# Patient Record
Sex: Male | Born: 1950
Health system: Southern US, Community
[De-identification: ages and names within clinical notes are randomized; demographics above are authoritative.]

## PROBLEM LIST (undated history)

## (undated) DIAGNOSIS — I10 Essential (primary) hypertension: Secondary | ICD-10-CM

## (undated) DIAGNOSIS — E785 Hyperlipidemia, unspecified: Secondary | ICD-10-CM

## (undated) DIAGNOSIS — E039 Hypothyroidism, unspecified: Secondary | ICD-10-CM

## (undated) DIAGNOSIS — L719 Rosacea, unspecified: Secondary | ICD-10-CM

## (undated) DIAGNOSIS — M1990 Unspecified osteoarthritis, unspecified site: Secondary | ICD-10-CM

## (undated) DIAGNOSIS — E079 Disorder of thyroid, unspecified: Secondary | ICD-10-CM

## (undated) DIAGNOSIS — K219 Gastro-esophageal reflux disease without esophagitis: Secondary | ICD-10-CM

## (undated) DIAGNOSIS — Z8719 Personal history of other diseases of the digestive system: Secondary | ICD-10-CM

## (undated) DIAGNOSIS — E876 Hypokalemia: Secondary | ICD-10-CM

## (undated) DIAGNOSIS — N200 Calculus of kidney: Secondary | ICD-10-CM

## (undated) HISTORY — DX: Essential (primary) hypertension: I10

## (undated) HISTORY — DX: Hypokalemia: E87.6

## (undated) HISTORY — DX: Calculus of kidney: N20.0

## (undated) HISTORY — DX: Hyperlipidemia, unspecified: E78.5

## (undated) HISTORY — PX: OTHER SURGICAL HISTORY: SHX169

## (undated) HISTORY — DX: Rosacea, unspecified: L71.9

## (undated) HISTORY — PX: SPINE SURGERY: SHX786

## (undated) HISTORY — PX: COLONOSCOPY: SHX174

## (undated) HISTORY — DX: Disorder of thyroid, unspecified: E07.9

---

## 1980-07-31 HISTORY — PX: HEMORRHOID SURGERY: SHX153

## 1980-07-31 HISTORY — PX: OTHER SURGICAL HISTORY: SHX169

## 1997-05-31 ENCOUNTER — Encounter: Payer: Self-pay | Admitting: Family Medicine

## 1997-05-31 LAB — CONVERTED CEMR LAB: PSA: 1.7 ng/mL

## 1999-12-01 ENCOUNTER — Encounter: Payer: Self-pay | Admitting: Neurosurgery

## 1999-12-02 ENCOUNTER — Inpatient Hospital Stay (HOSPITAL_COMMUNITY): Admission: RE | Admit: 1999-12-02 | Discharge: 1999-12-03 | Payer: Self-pay | Admitting: Neurosurgery

## 1999-12-02 ENCOUNTER — Encounter: Payer: Self-pay | Admitting: Neurosurgery

## 1999-12-02 HISTORY — PX: LAMINECTOMY: SHX219

## 1999-12-19 ENCOUNTER — Encounter: Payer: Self-pay | Admitting: Neurosurgery

## 1999-12-19 ENCOUNTER — Encounter: Admission: RE | Admit: 1999-12-19 | Discharge: 1999-12-19 | Payer: Self-pay | Admitting: Neurosurgery

## 2000-02-02 ENCOUNTER — Encounter: Payer: Self-pay | Admitting: Neurosurgery

## 2000-02-02 ENCOUNTER — Encounter: Admission: RE | Admit: 2000-02-02 | Discharge: 2000-02-02 | Payer: Self-pay | Admitting: Neurosurgery

## 2001-08-16 ENCOUNTER — Ambulatory Visit (HOSPITAL_COMMUNITY): Admission: RE | Admit: 2001-08-16 | Discharge: 2001-08-16 | Payer: Self-pay | Admitting: Orthopedic Surgery

## 2001-08-16 ENCOUNTER — Encounter: Payer: Self-pay | Admitting: Orthopedic Surgery

## 2002-02-28 ENCOUNTER — Encounter: Payer: Self-pay | Admitting: Family Medicine

## 2002-02-28 LAB — CONVERTED CEMR LAB: PSA: 1.4 ng/mL

## 2003-04-15 HISTORY — PX: OTHER SURGICAL HISTORY: SHX169

## 2004-02-29 ENCOUNTER — Encounter: Payer: Self-pay | Admitting: Family Medicine

## 2004-02-29 LAB — CONVERTED CEMR LAB: PSA: 1.43 ng/mL

## 2004-03-31 ENCOUNTER — Encounter: Payer: Self-pay | Admitting: Family Medicine

## 2004-03-31 LAB — CONVERTED CEMR LAB: PSA: 2.4 ng/mL

## 2004-10-19 ENCOUNTER — Ambulatory Visit: Payer: Self-pay | Admitting: Family Medicine

## 2005-03-31 ENCOUNTER — Encounter: Payer: Self-pay | Admitting: Family Medicine

## 2005-03-31 LAB — CONVERTED CEMR LAB: PSA: 1.87 ng/mL

## 2005-04-18 ENCOUNTER — Ambulatory Visit: Payer: Self-pay | Admitting: Family Medicine

## 2005-04-20 ENCOUNTER — Ambulatory Visit: Payer: Self-pay | Admitting: Family Medicine

## 2005-08-14 ENCOUNTER — Ambulatory Visit: Payer: Self-pay | Admitting: Family Medicine

## 2005-10-18 ENCOUNTER — Ambulatory Visit: Payer: Self-pay | Admitting: Family Medicine

## 2006-04-30 ENCOUNTER — Encounter: Payer: Self-pay | Admitting: Family Medicine

## 2006-04-30 LAB — CONVERTED CEMR LAB: PSA: 3.16 ng/mL

## 2006-05-03 ENCOUNTER — Ambulatory Visit: Payer: Self-pay | Admitting: Family Medicine

## 2006-05-17 ENCOUNTER — Ambulatory Visit: Payer: Self-pay | Admitting: Family Medicine

## 2006-06-30 ENCOUNTER — Encounter: Payer: Self-pay | Admitting: Family Medicine

## 2006-06-30 LAB — CONVERTED CEMR LAB: PSA: 2.53 ng/mL

## 2006-07-13 ENCOUNTER — Ambulatory Visit: Payer: Self-pay | Admitting: Family Medicine

## 2006-07-17 ENCOUNTER — Ambulatory Visit: Payer: Self-pay | Admitting: Family Medicine

## 2006-10-12 ENCOUNTER — Ambulatory Visit: Payer: Self-pay | Admitting: Family Medicine

## 2006-10-12 LAB — CONVERTED CEMR LAB
PSA: 3.46 ng/mL
PSA: 3.46 ng/mL (ref 0.10–4.00)
TSH: 4.16 microintl units/mL (ref 0.35–5.50)

## 2006-10-16 ENCOUNTER — Ambulatory Visit: Payer: Self-pay | Admitting: Family Medicine

## 2006-12-10 HISTORY — PX: PROSTATE BIOPSY: SHX241

## 2007-05-14 ENCOUNTER — Encounter: Payer: Self-pay | Admitting: Family Medicine

## 2007-05-14 DIAGNOSIS — E039 Hypothyroidism, unspecified: Secondary | ICD-10-CM | POA: Insufficient documentation

## 2007-05-14 DIAGNOSIS — I10 Essential (primary) hypertension: Secondary | ICD-10-CM | POA: Insufficient documentation

## 2007-05-15 DIAGNOSIS — R739 Hyperglycemia, unspecified: Secondary | ICD-10-CM | POA: Insufficient documentation

## 2007-05-22 ENCOUNTER — Ambulatory Visit: Payer: Self-pay | Admitting: Family Medicine

## 2007-05-22 LAB — CONVERTED CEMR LAB
ALT: 27 units/L (ref 0–53)
AST: 22 units/L (ref 0–37)
BUN: 11 mg/dL (ref 6–23)
CO2: 29 meq/L (ref 19–32)
Calcium: 9.1 mg/dL (ref 8.4–10.5)
Chloride: 108 meq/L (ref 96–112)
Cholesterol: 141 mg/dL (ref 0–200)
Creatinine, Ser: 1.1 mg/dL (ref 0.4–1.5)
Creatinine,U: 119.5 mg/dL
Free T4: 0.9 ng/dL (ref 0.6–1.6)
GFR calc Af Amer: 90 mL/min
GFR calc non Af Amer: 74 mL/min
Glucose, Bld: 110 mg/dL — ABNORMAL HIGH (ref 70–99)
HDL: 32.8 mg/dL — ABNORMAL LOW (ref >39.0)
LDL Cholesterol: 87 mg/dL (ref 0–99)
Microalb Creat Ratio: 13.4 mg/g (ref 0.0–30.0)
Microalb, Ur: 1.6 mg/dL (ref 0.0–1.9)
PSA: 2.8 ng/mL (ref 0.10–4.00)
Potassium: 3.5 meq/L (ref 3.5–5.1)
Sodium: 142 meq/L (ref 135–145)
TSH: 3.88 microintl units/mL (ref 0.35–5.50)
Total CHOL/HDL Ratio: 4.3
Triglycerides: 106 mg/dL (ref 0–149)
VLDL: 21 mg/dL (ref 0–40)

## 2007-05-30 ENCOUNTER — Encounter: Payer: Self-pay | Admitting: Family Medicine

## 2007-06-05 ENCOUNTER — Ambulatory Visit: Payer: Self-pay | Admitting: Family Medicine

## 2007-06-06 ENCOUNTER — Encounter: Payer: Self-pay | Admitting: Family Medicine

## 2007-06-28 ENCOUNTER — Ambulatory Visit: Payer: Self-pay | Admitting: Family Medicine

## 2007-07-01 ENCOUNTER — Encounter (INDEPENDENT_AMBULATORY_CARE_PROVIDER_SITE_OTHER): Payer: Self-pay | Admitting: *Deleted

## 2007-07-01 ENCOUNTER — Encounter: Payer: Self-pay | Admitting: Family Medicine

## 2007-10-22 ENCOUNTER — Encounter: Payer: Self-pay | Admitting: Family Medicine

## 2007-11-06 ENCOUNTER — Encounter: Payer: Self-pay | Admitting: Family Medicine

## 2007-11-22 ENCOUNTER — Ambulatory Visit: Payer: Self-pay | Admitting: Family Medicine

## 2007-11-22 LAB — CONVERTED CEMR LAB
BUN: 10 mg/dL (ref 6–23)
CO2: 31 meq/L (ref 19–32)
Calcium: 9.2 mg/dL (ref 8.4–10.5)
Chloride: 107 meq/L (ref 96–112)
Creatinine, Ser: 1 mg/dL (ref 0.4–1.5)
GFR calc Af Amer: 100 mL/min
GFR calc non Af Amer: 83 mL/min
Glucose, Bld: 116 mg/dL — ABNORMAL HIGH (ref 70–99)
Potassium: 3.4 meq/L — ABNORMAL LOW (ref 3.5–5.1)
Sodium: 144 meq/L (ref 135–145)

## 2007-11-28 ENCOUNTER — Ambulatory Visit: Payer: Self-pay | Admitting: Family Medicine

## 2008-04-01 ENCOUNTER — Encounter: Payer: Self-pay | Admitting: Family Medicine

## 2008-04-02 ENCOUNTER — Encounter: Payer: Self-pay | Admitting: Family Medicine

## 2008-06-02 ENCOUNTER — Ambulatory Visit: Payer: Self-pay | Admitting: Family Medicine

## 2008-06-02 LAB — CONVERTED CEMR LAB
ALT: 24 U/L (ref 0–53)
AST: 22 U/L (ref 0–37)
Albumin: 4 g/dL (ref 3.5–5.2)
Alkaline Phosphatase: 62 U/L (ref 39–117)
BUN: 11 mg/dL (ref 6–23)
Bilirubin, Direct: 0.1 mg/dL (ref 0.0–0.3)
CO2: 32 meq/L (ref 19–32)
Calcium: 9.1 mg/dL (ref 8.4–10.5)
Chloride: 103 meq/L (ref 96–112)
Cholesterol: 130 mg/dL (ref 0–200)
Creatinine, Ser: 1 mg/dL (ref 0.4–1.5)
Creatinine,U: 74.9 mg/dL
Free T4: 1 ng/dL (ref 0.6–1.6)
GFR calc Af Amer: 100 mL/min
GFR calc non Af Amer: 82 mL/min
Glucose, Bld: 94 mg/dL (ref 70–99)
HDL: 39.9 mg/dL (ref >39.0)
LDL Cholesterol: 71 mg/dL (ref 0–99)
Microalb Creat Ratio: 12 mg/g (ref 0.0–30.0)
Microalb, Ur: 0.9 mg/dL (ref 0.0–1.9)
PSA: 2.8 ng/mL (ref 0.10–4.00)
Potassium: 3.3 meq/L — ABNORMAL LOW (ref 3.5–5.1)
Sodium: 141 meq/L (ref 135–145)
TSH: 4.56 u[IU]/mL (ref 0.35–5.50)
Total Bilirubin: 1.4 mg/dL — ABNORMAL HIGH (ref 0.3–1.2)
Total CHOL/HDL Ratio: 3.3
Total Protein: 6.7 g/dL (ref 6.0–8.3)
Triglycerides: 95 mg/dL (ref 0–149)
VLDL: 19 mg/dL (ref 0–40)

## 2008-06-11 ENCOUNTER — Ambulatory Visit: Payer: Self-pay | Admitting: Family Medicine

## 2008-07-15 ENCOUNTER — Encounter (INDEPENDENT_AMBULATORY_CARE_PROVIDER_SITE_OTHER): Payer: Self-pay | Admitting: *Deleted

## 2008-07-15 ENCOUNTER — Ambulatory Visit: Payer: Self-pay | Admitting: Family Medicine

## 2008-07-15 LAB — CONVERTED CEMR LAB
OCCULT 1: NEGATIVE
OCCULT 2: NEGATIVE
OCCULT 3: NEGATIVE

## 2008-08-24 ENCOUNTER — Telehealth: Payer: Self-pay | Admitting: Family Medicine

## 2008-11-18 ENCOUNTER — Encounter: Payer: Self-pay | Admitting: Family Medicine

## 2008-12-01 ENCOUNTER — Encounter: Payer: Self-pay | Admitting: Family Medicine

## 2008-12-02 ENCOUNTER — Ambulatory Visit: Payer: Self-pay | Admitting: Family Medicine

## 2008-12-02 LAB — CONVERTED CEMR LAB
BUN: 13 mg/dL (ref 6–23)
CO2: 30 meq/L (ref 19–32)
Calcium: 8.9 mg/dL (ref 8.4–10.5)
Chloride: 109 meq/L (ref 96–112)
Creatinine, Ser: 1.1 mg/dL (ref 0.4–1.5)
GFR calc non Af Amer: 73.62 mL/min (ref >60)
Glucose, Bld: 100 mg/dL — ABNORMAL HIGH (ref 70–99)
Potassium: 3.7 meq/L (ref 3.5–5.1)
Sodium: 144 meq/L (ref 135–145)

## 2008-12-09 ENCOUNTER — Ambulatory Visit: Payer: Self-pay | Admitting: Family Medicine

## 2008-12-09 ENCOUNTER — Encounter (INDEPENDENT_AMBULATORY_CARE_PROVIDER_SITE_OTHER): Payer: Self-pay | Admitting: *Deleted

## 2009-03-31 ENCOUNTER — Encounter: Payer: Self-pay | Admitting: Family Medicine

## 2009-04-01 ENCOUNTER — Encounter: Payer: Self-pay | Admitting: Family Medicine

## 2009-06-11 ENCOUNTER — Ambulatory Visit: Payer: Self-pay | Admitting: Family Medicine

## 2009-06-13 LAB — CONVERTED CEMR LAB
ALT: 24 units/L (ref 0–53)
AST: 21 units/L (ref 0–37)
Albumin: 4.3 g/dL (ref 3.5–5.2)
Alkaline Phosphatase: 61 units/L (ref 39–117)
BUN: 11 mg/dL (ref 6–23)
Bilirubin, Direct: 0.2 mg/dL (ref 0.0–0.3)
CO2: 30 meq/L (ref 19–32)
Calcium: 9.1 mg/dL (ref 8.4–10.5)
Chloride: 103 meq/L (ref 96–112)
Cholesterol: 141 mg/dL (ref 0–200)
Creatinine, Ser: 1 mg/dL (ref 0.4–1.5)
Creatinine,U: 25.1 mg/dL
Free T4: 0.9 ng/dL (ref 0.6–1.6)
GFR calc non Af Amer: 82.02 mL/min (ref >60)
Glucose, Bld: 102 mg/dL — ABNORMAL HIGH (ref 70–99)
HDL: 38.4 mg/dL — ABNORMAL LOW (ref >39.00)
LDL Cholesterol: 85 mg/dL (ref 0–99)
Magnesium: 2.3 mg/dL (ref 1.5–2.5)
Microalb Creat Ratio: 8 mg/g (ref 0.0–30.0)
Microalb, Ur: 0.2 mg/dL (ref 0.0–1.9)
PSA: 4.1 ng/mL — ABNORMAL HIGH (ref 0.10–4.00)
Potassium: 3.8 meq/L (ref 3.5–5.1)
Sodium: 140 meq/L (ref 135–145)
TSH: 2.7 microintl units/mL (ref 0.35–5.50)
Total Bilirubin: 1.9 mg/dL — ABNORMAL HIGH (ref 0.3–1.2)
Total CHOL/HDL Ratio: 4
Total Protein: 7.1 g/dL (ref 6.0–8.3)
Triglycerides: 87 mg/dL (ref 0.0–149.0)
VLDL: 17.4 mg/dL (ref 0.0–40.0)

## 2009-06-17 ENCOUNTER — Ambulatory Visit: Payer: Self-pay | Admitting: Family Medicine

## 2009-06-17 DIAGNOSIS — S335XXA Sprain of ligaments of lumbar spine, initial encounter: Secondary | ICD-10-CM | POA: Insufficient documentation

## 2009-07-28 ENCOUNTER — Ambulatory Visit: Payer: Self-pay | Admitting: Family Medicine

## 2009-07-28 LAB — FECAL OCCULT BLOOD, GUAIAC: Fecal Occult Blood: NEGATIVE

## 2009-07-28 LAB — CONVERTED CEMR LAB
OCCULT 1: NEGATIVE
OCCULT 2: NEGATIVE
OCCULT 3: NEGATIVE

## 2009-07-29 ENCOUNTER — Encounter: Payer: Self-pay | Admitting: Family Medicine

## 2009-09-14 ENCOUNTER — Encounter (INDEPENDENT_AMBULATORY_CARE_PROVIDER_SITE_OTHER): Payer: Self-pay

## 2009-09-15 ENCOUNTER — Telehealth: Payer: Self-pay | Admitting: Family Medicine

## 2009-09-16 ENCOUNTER — Encounter: Payer: Self-pay | Admitting: Family Medicine

## 2009-09-17 ENCOUNTER — Ambulatory Visit: Payer: Self-pay | Admitting: Gastroenterology

## 2009-09-24 ENCOUNTER — Ambulatory Visit: Payer: Self-pay | Admitting: Gastroenterology

## 2009-09-24 LAB — HM COLONOSCOPY

## 2009-09-28 ENCOUNTER — Encounter: Payer: Self-pay | Admitting: Gastroenterology

## 2009-12-22 ENCOUNTER — Encounter: Payer: Self-pay | Admitting: Family Medicine

## 2010-01-03 ENCOUNTER — Emergency Department (HOSPITAL_COMMUNITY): Admission: EM | Admit: 2010-01-03 | Discharge: 2010-01-03 | Payer: Self-pay | Admitting: Family Medicine

## 2010-01-05 ENCOUNTER — Ambulatory Visit: Payer: Self-pay | Admitting: Family Medicine

## 2010-01-05 DIAGNOSIS — J069 Acute upper respiratory infection, unspecified: Secondary | ICD-10-CM | POA: Insufficient documentation

## 2010-01-05 DIAGNOSIS — H6980 Other specified disorders of Eustachian tube, unspecified ear: Secondary | ICD-10-CM | POA: Insufficient documentation

## 2010-01-25 ENCOUNTER — Encounter: Payer: Self-pay | Admitting: Family Medicine

## 2010-03-08 ENCOUNTER — Encounter (INDEPENDENT_AMBULATORY_CARE_PROVIDER_SITE_OTHER): Payer: Self-pay | Admitting: *Deleted

## 2010-03-31 ENCOUNTER — Encounter: Payer: Self-pay | Admitting: Family Medicine

## 2010-06-14 ENCOUNTER — Telehealth (INDEPENDENT_AMBULATORY_CARE_PROVIDER_SITE_OTHER): Payer: Self-pay | Admitting: *Deleted

## 2010-06-16 ENCOUNTER — Ambulatory Visit: Payer: Self-pay | Admitting: Family Medicine

## 2010-06-16 LAB — CONVERTED CEMR LAB
ALT: 32 units/L (ref 0–53)
AST: 28 units/L (ref 0–37)
Albumin: 4.5 g/dL (ref 3.5–5.2)
Alkaline Phosphatase: 53 units/L (ref 39–117)
BUN: 14 mg/dL (ref 6–23)
Bilirubin, Direct: 0.2 mg/dL (ref 0.0–0.3)
CO2: 29 meq/L (ref 19–32)
Calcium: 9.2 mg/dL (ref 8.4–10.5)
Chloride: 102 meq/L (ref 96–112)
Cholesterol: 157 mg/dL (ref 0–200)
Creatinine, Ser: 1 mg/dL (ref 0.4–1.5)
GFR calc non Af Amer: 79.88 mL/min (ref >60)
Glucose, Bld: 98 mg/dL (ref 70–99)
HDL: 43.8 mg/dL (ref >39.00)
LDL Cholesterol: 99 mg/dL (ref 0–99)
PSA: 3.23 ng/mL (ref 0.10–4.00)
Potassium: 3.9 meq/L (ref 3.5–5.1)
Sodium: 140 meq/L (ref 135–145)
TSH: 4.49 microintl units/mL (ref 0.35–5.50)
Total Bilirubin: 1.9 mg/dL — ABNORMAL HIGH (ref 0.3–1.2)
Total CHOL/HDL Ratio: 4
Total Protein: 7.1 g/dL (ref 6.0–8.3)
Triglycerides: 73 mg/dL (ref 0.0–149.0)
VLDL: 14.6 mg/dL (ref 0.0–40.0)

## 2010-06-21 ENCOUNTER — Ambulatory Visit: Payer: Self-pay | Admitting: Family Medicine

## 2010-08-30 NOTE — Letter (Signed)
Summary: Results Letter  [facility]  [address] [place], [place] [address]   Phone: [phone]  Fax: [phone]        September 28, 2009 MRN: [id number]    [facility] [address] [place], [place]  [address]    Dear Mr. SHI,  Your biopsy results did not show any remarkable findings.  I recommend you repeat your colonoscopy in 10 years.  Should you have any further questions or immediate concers, feel free to contact me.  Sincerely,  Barbette Hair. Arlyce Dice, M.D., [facility]          Sincerely,  Louis Meckel MD  This letter has been electronically signed by your physician.  Appended Document: Results Letter letter mailed 3.3.11

## 2010-08-30 NOTE — Assessment & Plan Note (Signed)
Summary: cpx/rbh   Vital Signs:  Patient profile:   60 year old male Weight:      244 pounds Temp:     98.5 degrees F oral Pulse rate:   80 / minute Pulse rhythm:   regular BP sitting:   128 / 82  (left arm) Cuff size:   large  Vitals Entered By: Sydell Axon LPN (June 17, 2009 2:56 PM) CC: 30 Minute checkup, hemoccult cards given to patient   History of Present Illness: Pt here for Comp Exam, has blood every time he has a BM. He has known hemm with fissure. He is having low back pain...aleve hasn't helped. He has had disc problem with radiculopathy of th left, now with pain down the left side. Basically emanates from the sciatic notch. He has seen Dr Jeral Fruit a long time ago for  pain of the neck with laminectomy in 2001. Has gained 5 pounds since last year due to vacation!!  Preventive Screening-Counseling & Management  Alcohol-Tobacco     Alcohol drinks/day: 0     Alcohol type: rare drinking     Smoking Status: never     Passive Smoke Exposure: no  Caffeine-Diet-Exercise     Caffeine use/day: 0     Does Patient Exercise: yes     Type of exercise: weights, elliptical, bike     Times/week: 3  Problems Prior to Update: 1)  Special Screening Malig Neoplasms Other Sites  (ICD-V76.49) 2)  Health Maintenance Exam  (ICD-V70.0) 3)  Disorder, Carbohydrate Metabolism Nos  (ICD-271.9) 4)  Screening For Malignant Neoplasm, Prostate  (ICD-V76.44) 5)  Hyperglycemia  (ICD-790.29) 6)  Hypokalemia, Secondary Hctz, Avoid  (ICD-276.8) 7)  Hypercholesterolemia (238/LDL 158)  (ICD-272.0) 8)  Hypothyroidism  (ICD-244.9) 9)  Hypertension  (ICD-401.9)  Medications Prior to Update: 1)  Klor-Con 10 10 Meq  Tbcr (Potassium Chloride) .... Take 6 Tablets Daily 2)  Accupril 40 Mg  Tabs (Quinapril Hcl) .... Take One By Mouth Once A Day 3)  Synthroid 112 Mcg  Tabs (Levothyroxine Sodium) .... Take One By Mouth Once A Day 4)  Spironolactone 25 Mg  Tabs (Spironolactone) .... Take One By Mouth Once  A Day 5)  Caduet 5-10 Mg  Tabs (Amlodipine-Atorvastatin) .... Take One By Mouth At Bedtime 6)  Anamantle Hc Forte 3-1 %  Kit (Lidocaine-Hydrocortisone Ace) .... Use As Directed  Allergies: No Known Drug Allergies  Past History:  Past Medical History: Last updated: 05/14/2007 Hypertension Hypothyroidism  Past Surgical History: Last updated: 06/05/2007 Morton's Neuroma Repair 1982 Hemmorhoidectomy 1982 Colonoscopy wnl except int hemms  12/24/98            10 years Laminectomy C4/5 5/6 6/7 Fusion (Dr. 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  Risk Factors: Alcohol Use: 0 (06/17/2009) Caffeine Use: 0 (06/17/2009) Exercise: yes (06/17/2009)  Risk Factors: Smoking Status: never (06/17/2009) Passive Smoke Exposure: no (  06/17/2009)  Family History: Father: dec (9/08)  Ruptured AAA  polys Mother: dec  acute kidney failure,CAD, HTN,DM,Diab Retinop, Toe Amputation Sister A 60 CV + CAD mother HBP + Mother DM + Mother, GM Prostate cancer + M Uncle x 2 Depression - none ETOH/Drug abuse - none Strokes - none  Social History: Caffeine use/day:  0  Review of Systems General:  Denies chills, fatigue, fever, loss of appetite, malaise, sleep disorder, sweats, weakness, and weight loss. Eyes:  Denies blurring, discharge, eye pain, and light sensitivity. ENT:  Denies  decreased hearing, ear discharge, earache, and ringing in ears. CV:  Denies chest pain or discomfort, near fainting, palpitations, and shortness of breath with exertion. Resp:  Denies cough, shortness of breath, sputum productive, and wheezing. GI:  Complains of bloody stools and hemorrhoids; denies abdominal pain, change in bowel habits, constipation, dark tarry stools, diarrhea, excessive appetite, gas, indigestion, loss of appetite, nausea, vomiting, vomiting blood, and yellowish skin color; irritation. GU:  Denies discharge, dysuria, hematuria, incontinence, nocturia, and urinary frequency. MS:  Complains of low back pain; denies joint pain, muscle aches, muscle weakness, and stiffness. Derm:  Denies dryness, itching, and rash. Neuro:  Denies difficulty with concentration, headaches, memory loss, poor balance, tingling, and tremors.  Physical Exam  General:  Well-developed,well-nourished,in no acute distress; alert,appropriate and cooperative throughout examination Head:  Normocephalic and atraumatic without obvious abnormalities. No apparent alopecia but typical male pattern balding. Eyes:  Conjunctiva clear bilaterally.  Ears:  External ear exam shows no significant lesions or deformities.  Otoscopic examination reveals clear canals, tympanic membranes are intact bilaterally without bulging, retraction, inflammation or discharge. Hearing is grossly normal bilaterally. Nose:  External nasal examination shows no deformity or inflammation. Nasal mucosa are pink and moist without lesions or exudates. Mouth:  Oral mucosa and oropharynx without lesions or exudates.  Teeth in good repair. Neck:  No deformities, masses, or tenderness noted. Chest Wall:  No deformities, masses, tenderness or gynecomastia noted. Breasts:  No masses or gynecomastia noted Lungs:  Normal respiratory effort, chest expands symmetrically. Lungs are clear to auscultation, no crackles or wheezes. Heart:  Normal rate and  regular rhythm. S1 and S2 normal without gallop, murmur, click, rub or other extra sounds. Abdomen:  Bowel sounds positive,abdomen soft and non-tender without masses, organomegaly or hernias noted. Rectal:  No external abnormalities noted. Normal sphincter tone. No rectal masses or tenderness. G pos, hemms inflamed and sore. No overt blood when done. Genitalia:  Testes bilaterally descended without nodularity, tenderness or masses. No scrotal masses or lesions. No penis lesions or urethral discharge. Prostate:  Prostate gland firm and smooth, no enlargement, nodularity, tenderness, mass  or induration. 40gms with right pole larger than left but smooth and NT. Msk:  No deformity or scoliosis noted of thoracic or lumbar spine.  Back sore to lying down and getting up in the lower lumbar area, NT top palpation. Sciatic notch NT but radiculopathy down sciatic distribution. Pulses:  R and L carotid,radial,femoral,dorsalis pedis and posterior tibial pulses are full and equal bilaterally Extremities:  No clubbing, cyanosis, edema, or deformity noted with normal full range of motion of all joints.   Neurologic:  No cranial nerve deficits noted. Station and gait are normal. Plantar reflexes are down-going bilaterally. DTRs are symmetrical throughout. Sensory, motor and coordinative functions appear intact. Skin:  Intact without suspicious lesions or rashes Cervical Nodes:  No lymphadenopathy noted Inguinal Nodes:  No significant adenopathy Psych:  Cognition and judgment appear intact. Alert and cooperative  with normal attention span and concentration. No apparent delusions, illusions, hallucinations   Impression & Recommendations:  Problem # 1:  HEALTH MAINTENANCE EXAM (ICD-V70.0) Assessment Comment Only  Problem # 2:  DISORDER, CARBOHYDRATE METABOLISM NOS (ICD-271.9) Assessment: Unchanged Stable. Again stressed care with sweets and carbs.  Problem # 3:  SCREENING FOR MALIGNANT NEOPLASM, PROSTATE  (ICD-V76.44) Bears watching. Insure repeats next year. Has Assymetry of right pole > left but smooth and regular.  Problem # 4:  HYPERGLYCEMIA (ICD-790.29) Assessment: Unchanged Stable.  Problem # 5:  HYPOKALEMIA, SECONDARY HCTZ, AVOID (ICD-276.8) Assessment: Improved Stable.  Problem # 6:  HYPERCHOLESTEROLEMIA (238/LDL 158) (ICD-272.0) Assessment: Unchanged Stable. His updated medication list for this problem includes:    Caduet 5-10 Mg Tabs (Amlodipine-atorvastatin) .Marland Kitchen... Take one by mouth at bedtime  Labs Reviewed: SGOT: 21 (06/11/2009)   SGPT: 24 (06/11/2009)   HDL:38.40 (06/11/2009), 39.9 (06/02/2008)  LDL:85 (06/11/2009), 71 (06/02/2008)  Chol:141 (06/11/2009), 130 (06/02/2008)  Trig:87.0 (06/11/2009), 95 (06/02/2008)  Problem # 7:  HYPOTHYROIDISM (ICD-244.9) Assessment: Unchanged Stable. Cont curr med. His updated medication list for this problem includes:    Synthroid 112 Mcg Tabs (Levothyroxine sodium) .Marland Kitchen... Take one by mouth once a day  Labs Reviewed: TSH: 2.70 (06/11/2009)    Chol: 141 (06/11/2009)   HDL: 38.40 (06/11/2009)   LDL: 85 (06/11/2009)   TG: 87.0 (06/11/2009)  Problem # 8:  HYPERTENSION (ICD-401.9) Assessment: Unchanged Stable. His updated medication list for this problem includes:    Accupril 40 Mg Tabs (Quinapril hcl) .Marland Kitchen... Take one by mouth once a day    Spironolactone 25 Mg Tabs (Spironolactone) .Marland Kitchen... Take one by mouth once a day    Caduet 5-10 Mg Tabs (Amlodipine-atorvastatin) .Marland Kitchen... Take one by mouth at bedtime  BP today: 128/82 Prior BP: 120/80 (12/09/2008)  Labs Reviewed: K+: 3.8 (06/11/2009) Creat: : 1.0 (06/11/2009)   Chol: 141 (06/11/2009)   HDL: 38.40 (06/11/2009)   LDL: 85 (06/11/2009)   TG: 87.0 (06/11/2009)  Complete Medication List: 1)  Klor-con 10 10 Meq Tbcr (Potassium chloride) .... Take 6 tablets daily 2)  Accupril 40 Mg Tabs (Quinapril hcl) .... Take one by mouth once a day 3)  Synthroid 112 Mcg Tabs (Levothyroxine sodium)  .... Take one by mouth once a day 4)  Spironolactone 25 Mg Tabs (Spironolactone) .... Take one by mouth once a day 5)  Caduet 5-10 Mg Tabs (Amlodipine-atorvastatin) .... Take one by mouth at bedtime 6)  Anamantle Hc Forte 3-1 % Kit (Lidocaine-hydrocortisone ace) .... Use as directed 7)  Prednisone 20 Mg Tabs (Prednisone) .... 3 tabs by mouth in am for 5 days then  decrease by 1/2 tab by mouth ever 2 days . when finished 5 mg daily for 4 days. 8)  Prednisone 5 Mg Tabs (Prednisone) .... One tab by mouth once daily after 20 mg taper 9)  Flexeril 10 Mg Tabs (Cyclobenzaprine hcl) .... One tab by mouth three times a day  Patient Instructions: 1)  RTC as needed. 2)  Insure PSA again next year. Prescriptions: FLEXERIL 10 MG TABS (CYCLOBENZAPRINE HCL) one tab by mouth three times a day  #30 x 0   Entered and Authorized by:   Shaune Leeks MD   Signed by:   Shaune Leeks MD on 06/17/2009   Method used:   Electronically to        [facility]  [address] [id number]       [address] [facility]       [place]  [place], [place]  [address]       Ph: [phone]       Fax: [phone]   RxID:   [id number] PREDNISONE 5 MG TABS (PREDNISONE) one tab by mouth once daily after 20 mg taper  #4 x 0   Entered and Authorized by:   Shaune Leeks MD   Signed by:   Shaune Leeks MD on 06/17/2009   Method used:   Electronically to        [facility]  [address] [phone][facility] (retail)       [address]       [place], [place]  [address]       Ph: [phone]       Fax: [phone]   RxID:   [id number] PREDNISONE 20 MG TABS (PREDNISONE) 3 tabs by mouth in AM for 5 days then  decrease by 1/2 tab by mouth ever 2 days . when finished 5 mg daily for 4 days.  #30 x 0   Entered and Authorized by:   Shaune Leeks MD   Signed by:   Shaune Leeks MD on 06/17/2009   Method used:   [facility]        [facility]  [address] [id number]       [address]       [place], [place]  [address]       Ph: [phone]       Fax: [phone]   RxID:   [id number]   Current Allergies (reviewed today): No known allergies    Influenza Immunization History:    Influenza # 1:  Fluvax 3+ (04/23/2009)    Appended Document: cpx/rbh    Clinical Lists Changes  Problems: Added new problem of LUMBAR STRAIN, ACUTE (ICD-847.2) Assessed LUMBAR STRAIN, ACUTE as new - Start Steriod taper and musc relaxer. RTC if sxs don't abate.        Impression & Recommendations:  Problem # 1:  LUMBAR STRAIN, ACUTE (ICD-847.2) Assessment New Start Steriod taper and musc relaxer. RTC if sxs don't abate.  Complete Medication List: 1)  Klor-con 10 10 Meq Tbcr (Potassium chloride) .... Take 6 tablets daily 2)  Accupril 40 Mg Tabs (Quinapril hcl) .... Take one by mouth once a day 3)  Synthroid 112 Mcg Tabs (Levothyroxine sodium) .... Take one by mouth once a day 4)  Spironolactone 25 Mg Tabs (Spironolactone) .... Take one by mouth once a day 5)  Caduet 5-10 Mg Tabs (Amlodipine-atorvastatin) .... Take one by mouth at bedtime 6)  Anamantle Hc Forte 3-1 % Kit (Lidocaine-hydrocortisone ace) .... Use as directed 7)  Prednisone 20 Mg Tabs (Prednisone) .... 3 tabs by mouth in am for 5 days then  decrease by 1/2 tab by mouth ever 2 days . when finished 5 mg daily for 4 days. 8)  Prednisone 5 Mg Tabs (Prednisone) .... One tab by mouth once daily after 20 mg taper 9)  Flexeril 10 Mg Tabs (Cyclobenzaprine hcl) .... One tab by mouth three times a day

## 2010-08-30 NOTE — Letter (Signed)
Summary: Results Follow up Letter  [place] [facility]  [address] [place], [place] [address]   Phone: [phone]  Fax: [phone]    07/29/2009 MRN: [id number]  [facility] [address] [place], [place]  [address]  Dear Mr. PREVETTE,  The following are the results of your recent test(s):  Test         Result    Pap Smear:        Normal _____  Not Normal _____ Comments: ______________________________________________________ Cholesterol: LDL(Bad cholesterol):         Your goal is less than:         HDL (Good cholesterol):       Your goal is more than: Comments:  ______________________________________________________ Mammogram:        Normal _____  Not Normal _____ Comments:  ___________________________________________________________________ Hemoccult:        Normal __X___  Not normal _______ Comments:  Please follow up in one year.  _____________________________________________________________________ Other Tests:    We routinely do not discuss normal results over the telephone.  If you desire a copy of the results, or you have any questions about this information we can discuss them at your next office visit.   Sincerely,       Laurita Quint, MD

## 2010-08-30 NOTE — Assessment & Plan Note (Signed)
Summary: ?SINUS,EAR INFECTION/CLE   Vital Signs:  Patient profile:   60 year old male Weight:      250.25 pounds BMI:     33.14 Temp:     98.2 degrees F oral Pulse rate:   92 / minute Pulse rhythm:   regular BP sitting:   140 / 88  (left arm) Cuff size:   large  Vitals Entered By: Sydell Axon LPN (January 05, 453 12:37 PM) CC: Went to [facility] ER Monday, has a lot of head congestion and can't hear out of left ear   History of Present Illness: Pt here for sinus congestion and inability to hear from the left ear. He was seen in the [facility] and given Avelox and IBP.  He has had chills for 24 hrs, no fever. His sxs started May 29th. He started with throat congestion and used an expectorant...[facility] brand guaifenesin and a cough suppressant....started Mucinex DM and ran out.  He had chills, no fever, he has mild left discomfort, right ok biut can't here out of the left ear, nasal congestion with infrequent yellow mucous, no ST, some cough that is minimally productive.Marland KitchenMarland KitchenHe denies SOB, no N/V. BP is up slightly.  Problems Prior to Update: 1)  Lumbar Strain, Acute  (ICD-847.2) 2)  Special Screening Malig Neoplasms Other Sites  (ICD-V76.49) 3)  Health Maintenance Exam  (ICD-V70.0) 4)  Screening For Malignant Neoplasm, Prostate  (ICD-V76.44) 5)  Hyperglycemia  (ICD-790.29) 6)  Hypokalemia, Secondary Hctz, Avoid  (ICD-276.8) 7)  Hypercholesterolemia (238/LDL 158)  (ICD-272.0) 8)  Hypothyroidism  (ICD-244.9) 9)  Hypertension  (ICD-401.9)  Medications Prior to Update: 1)  Klor-Con 10 10 Meq  Tbcr (Potassium Chloride) .... Take 6 Tablets Daily 2)  Accupril 40 Mg  Tabs (Quinapril Hcl) .... Take One By Mouth Once A Day 3)  Synthroid 112 Mcg  Tabs (Levothyroxine Sodium) .... Take One By Mouth Once A Day 4)  Spironolactone 25 Mg  Tabs (Spironolactone) .... Take One By Mouth Once A Day 5)  Caduet 5-10 Mg  Tabs (Amlodipine-Atorvastatin) .... Take One By Mouth At Bedtime 6)  Anamantle Hc Forte 3-1 %   Kit (Lidocaine-Hydrocortisone Ace) .... Use As Directed 7)  Prednisone 20 Mg Tabs (Prednisone) .... 3 Tabs By Mouth in Am For 5 Days Then  Decrease By 1/2 Tab By Mouth Ever 2 Days . When Finished 5 Mg Daily For 4 Days. 8)  Prednisone 5 Mg Tabs (Prednisone) .... One Tab By Mouth Once Daily After 20 Mg Taper 9)  Flexeril 10 Mg Tabs (Cyclobenzaprine Hcl) .... One Tab By Mouth Three Times A Day 10)  Anusol-Hc 25 Mg Supp (Hydrocortisone Acetate) .... One Supp Per Rectum Three Times A Day As Needed Hemms.  Allergies: No Known Drug Allergies  Physical Exam  General:  Well-developed,well-nourished,in no acute distress; alert,appropriate and cooperative throughout examination, mildly congested and hoarse. Head:  Normocephalic and atraumatic without obvious abnormalities. No apparent alopecia but typical male pattern balding. SInuses minimally tender in max distrib Eyes:  Conjunctiva clear bilaterally.  Ears:  External ear exam shows no significant lesions or deformities.  Otoscopic examination reveals clear canals, tympanic membranes are intact bilaterally without bulging, retraction, inflammation or discharge. Left TM dull and slightly distorted but nonrythematous. Hearing is grossly normal bilaterally. Nose:  External nasal examination shows no deformity or inflammation. Nasal mucosa are pink and moist without lesions or exudates. Left nostril slightly narrowed with minimal inflammation. Mouth:  Oral mucosa and oropharynx without lesions or exudates.  Teeth in good repair. Pharynx minimally inflamed. Neck:  No deformities, masses, or tenderness noted. Lungs:  Normal respiratory effort, chest expands symmetrically. Lungs are clear to auscultation, no crackles or wheezes. Heart:  Normal rate and regular rhythm. S1 and S2 normal without gallop, murmur, click, rub or other extra sounds.   Impression & Recommendations:  Problem # 1:  URI (ICD-465.9) Assessment New  With minimal response to Avelox,  will presume his inflammation viral, not bacterial.  See instructions. His updated medication list for this problem includes:    Ibuprofen 800 Mg Tabs (Ibuprofen) .Marland Kitchen... Take one three times a day with meals as needed  Instructed on symptomatic treatment. Call if symptoms persist or worsen.   His updated medication list for this problem includes:    Ibuprofen 800 Mg Tabs (Ibuprofen) .Marland Kitchen... Take one three times a day with meals as needed  Problem # 2:  DYSFUNCTION OF EUSTACHIAN TUBE (ICD-381.81) Assessment: New See instructions.  Complete Medication List: 1)  Klor-con 10 10 Meq Tbcr (Potassium chloride) .... Take 6 tablets daily 2)  Accupril 40 Mg Tabs (Quinapril hcl) .... Take one by mouth once a day 3)  Synthroid 112 Mcg Tabs (Levothyroxine sodium) .... Take one by mouth once a day 4)  Spironolactone 25 Mg Tabs (Spironolactone) .... Take one by mouth once a day 5)  Caduet 5-10 Mg Tabs (Amlodipine-atorvastatin) .... Take one by mouth at bedtime 6)  Anamantle Hc Forte 3-1 % Kit (Lidocaine-hydrocortisone ace) .... Use as directed 7)  Flexeril 10 Mg Tabs (Cyclobenzaprine hcl) .... One tab by mouth three times a day 8)  Anusol-hc 25 Mg Supp (Hydrocortisone acetate) .... One supp per rectum three times a day as needed hemms. 9)  Ibuprofen 800 Mg Tabs (Ibuprofen) .... Take one three times a day with meals as needed 10)  Acetaminophen-codeine 120-12 Mg/47ml Soln (Acetaminophen-codeine) .... 5-81ml every four hours as needed cough or pain 11)  Avelox 400 Mg Tabs (Moxifloxacin hcl) .... Take one by mouth x 10 days 12)  Amoxicillin 500 Mg Caps (Amoxicillin) .... 2 tabs by mouth two times a day  Patient Instructions: 1)  Cont Guaifenesin....take 11/2 AM and Noon. 2)  Finish Avelox. 3)  Use Afrin 3 squirts one minute apart three times a day for threee days. 4)  Cont IBP 8oomg three times a day after meals. 5)  Lots of fluids. 6)  If develop cough, keep lozenge in moiuth. 7)  If becomes problem  taking Avelox and Accupril, stop Avelox. If sxs worsen doing that, start Amox (script given)  Prescriptions: AMOXICILLIN 500 MG CAPS (AMOXICILLIN) 2 tabs by mouth two times a day  #56 x 0   Entered and Authorized by:   Shaune Leeks MD   Signed by:   Shaune Leeks MD on 01/05/2010   Method used:   Print then Give to Patient   RxID:   [id number]   Current Allergies (reviewed today): No known allergies

## 2010-08-30 NOTE — Medication Information (Signed)
Summary: RX Folder/[facility] Signed and Faxed  RX Folder/[facility] Signed and Faxed   Imported By: Mickle Asper 06/07/2007 09:49:39  _____________________________________________________________________  External Attachment:    Type:   Image     Comment:   External Document

## 2010-08-30 NOTE — Letter (Signed)
Summary: Recall Colonoscopy Letter  [facility]  [address] [place], [place] [address]   Phone: [phone]  Fax: [phone]      Dec 09, 2008 MRN: [id number]   [facility] [address] [place], [place]  [address]   Dear Carlos Foster,   According to your medical record, it is time for you to schedule a Colonoscopy. The American Cancer Society recommends this procedure as a method to detect early colon cancer. Patients with a family history of colon cancer, or a personal history of colon polyps or inflammatory bowel disease are at increased risk.  This letter has beeen generated based on the recommendations made at the time of your procedure. If you feel that in your particular situation this may no longer apply, please contact our office.  Please call our office at [phone] to schedule this appointment or to update your records at your earliest convenience.  Thank you for cooperating with [place] to provide you with the very best care possible.   Sincerely,  Barbette Hair. Arlyce Dice, M.D.  [facility] [phone]

## 2010-08-30 NOTE — Assessment & Plan Note (Signed)
Summary: cpx/rbh   Vital Signs:  Patient Profile:   60 Years Old Male Height:     73.75 inches Weight:      256 pounds Temp:     99 degrees F oral Pulse rate:   88 / minute Pulse rhythm:   regular BP sitting:   140 / 80  (left arm) Cuff size:   large  Vitals Entered By: Providence Crosby (June 05, 2007 10:57 AM)                 Chief Complaint:  check up hemocult cards to patient.  History of Present Illness: Since seen, had prostate biopsy in May which was benign. No complaints and feels well. 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  Current Allergies: No known allergies   Past Surgical History:    Morton's Neuroma Repair 1982    Hemmorhoidectomy 1982    Colonoscopy wnl except int hemms  12/24/98            10 years    Laminectomy C4/5 5/6 6/7 Fusion (Dr. Jeral Fruit) 12/02/99    ETT, wnl ST dereased < 1mm V6 04/15/03    Prostate bx Annabell Howells)  Benign  12/10/06   Family History:    Father: dec (9/08)  Ruptured AAA  polys    Mother: dec  acute kidney failure,CAD, HTN,DM,Diab Retinop, Toe Amputation    Sister alive and well    CV + CAD mother    HBP + Mother    DM + Mother, GM    Prostate cancer + M Uncle x 2    Depression - none    ETOH/Drug abuse - none    Strokes - none   Risk Factors:  Tobacco use:  never Passive smoke exposure:  no Drug use:  no HIV high-risk behavior:  no Caffeine use:  3 drinks per day Alcohol use:  yes    Type:  rare drinking    Drinks per day:  0    Has patient --       Felt need to cut down:  no       Been annoyed by complaints:  no       Felt guilty about drinking:  no       Needed eye opener in the morning:  no    Counseled to quit/cut down alcohol use:  no Exercise:  yes    Times per week:  3    Type:  weights, elliptical, bike Seatbelt use:  100 %   Review of Systems  General      Denies chills, fatigue, fever, loss of appetite, malaise, sleep disorder, sweats, weakness, and weight loss.  Eyes  Denies blurring, discharge, double vision, eye irritation, eye pain, halos, itching, light sensitivity, red eye, vision loss-1 eye, and vision loss-both eyes.  ENT      Complains of decreased hearing.      Denies difficulty swallowing, ear discharge, earache, hoarseness, nasal congestion, nosebleeds, postnasal drainage, ringing in ears, sinus pressure, and sore throat.      slight left ear  CV      Denies bluish discoloration of lips or nails, chest pain or discomfort, difficulty breathing at night, difficulty breathing while lying down, fainting, fatigue, leg cramps with exertion, lightheadness, near fainting, palpitations, shortness of breath with exertion, swelling of feet, swelling of hands, and weight gain.  Resp      Denies chest discomfort, chest pain with inspiration, cough, coughing up  blood, excessive snoring, hypersomnolence, morning headaches, pleuritic, shortness of breath, sputum productive, and wheezing.  GI      Denies abdominal pain, bloody stools, change in bowel habits, constipation, dark tarry stools, diarrhea, excessive appetite, gas, hemorrhoids, indigestion, loss of appetite, nausea, vomiting, vomiting blood, and yellowish skin color.  GU      Complains of nocturia.      once  MS      Denies joint pain, joint redness, joint swelling, loss of strength, low back pain, mid back pain, muscle aches, muscle , cramps, muscle weakness, stiffness, and thoracic pain.  Derm      Denies changes in color of skin, changes in nail beds, dryness, excessive perspiration, flushing, hair loss, insect bite(s), itching, lesion(s), poor wound healing, and rash.  Neuro      Denies brief paralysis, difficulty with concentration, disturbances in coordination, falling down, headaches, inability to speak, memory loss, numbness, poor balance, seizures, sensation of room spinning, tingling, tremors, visual disturbances, and weakness.   Physical Exam  General:      Well-developed,well-nourished,in no acute distress; alert,appropriate and cooperative throughout examination Head:     Normocephalic and atraumatic without obvious abnormalities. No apparent alopecia or balding. Eyes:     Conjunctiva clear bilaterally.  Ears:     External ear exam shows no significant lesions or deformities.  Otoscopic examination reveals clear canals, tympanic membranes are intact bilaterally without bulging, retraction, inflammation or discharge. Hearing is grossly normal bilaterally. Nose:     External nasal examination shows no deformity or inflammation. Nasal mucosa are pink and moist without lesions or exudates. Mouth:     Oral mucosa and oropharynx without lesions or exudates.  Teeth in good repair. Neck:     No deformities, masses, or tenderness noted. Chest Wall:     No deformities, masses, tenderness or gynecomastia noted. Breasts:     No masses or gynecomastia noted Lungs:     Normal respiratory effort, chest expands symmetrically. Lungs are clear to auscultation, no crackles or wheezes. Heart:     Normal rate and regular rhythm. S1 and S2 normal without gallop, murmur, click, rub or other extra sounds. Abdomen:     Bowel sounds positive,abdomen soft and non-tender without masses, organomegaly or hernias noted. Rectal:     Not done...seeing Dr Annabell Howells regularly at present. Genitalia:     Not done Prostate:     Not done Msk:     No deformity or scoliosis noted of thoracic or lumbar spine.   Pulses:     R and L carotid,radial,femoral,dorsalis pedis and posterior tibial pulses are full and equal bilaterally Extremities:     No clubbing, cyanosis, edema, or deformity noted with normal full range of motion of all joints.   Neurologic:     No cranial nerve deficits noted. Station and gait are normal. Plantar reflexes are down-going bilaterally. DTRs are symmetrical throughout. Sensory, motor and coordinative functions appear intact. Skin:     Intact without  suspicious lesions or rashes. Benign moles throughout. Cervical Nodes:     No lymphadenopathy noted Inguinal Nodes:     No significant adenopathy Psych:     Cognition and judgment appear intact. Alert and cooperative with normal attention span and concentration. No apparent delusions, illusions, hallucinations    Impression & Recommendations:  Problem # 1:  HEALTH MAINTENANCE EXAM (ICD-V70.0) Assessment: Comment Only  Problem # 2:  DISORDER, CARBOHYDRATE METABOLISM NOS (ICD-271.9) Assessment: Unchanged Stable, encouraged to cont to watch intake.  Problem #  3:  SCREENING FOR MALIGNANT NEOPLASM, PROSTATE (ICD-V76.44) Assessment: Unchanged Stable ...had benign biopsy, to see Dr Annabell Howells early Dec again.  Problem # 4:  HYPOKALEMIA, SECONDARY HCTZ, AVOID (ICD-276.8) Assessment: Improved Stable.  Problem # 5:  HYPERCHOLESTEROLEMIA (238/LDL 158) (ICD-272.0) Assessment: Unchanged Stable and acceptable, continue as is. His updated medication list for this problem includes:    Caduet 5-10 Mg Tabs (Amlodipine-atorvastatin) .Marland Kitchen... Take one by mouth at bedtime  Labs Reviewed: Chol: 141 (05/22/2007)   HDL: 32.8 (05/22/2007)   LDL: 87 (05/22/2007)   TG: 106 (05/22/2007) SGOT: 22 (05/22/2007)   SGPT: 27 (05/22/2007)   Problem # 6:  HYPOTHYROIDISM (ICD-244.9) Assessment: Unchanged Stable...cont curr dose. His updated medication list for this problem includes:    Synthroid 112 Mcg Tabs (Levothyroxine sodium) .Marland Kitchen... Take one by mouth once a day  Labs Reviewed: TSH: 3.88 (05/22/2007)    Chol: 141 (05/22/2007)   HDL: 32.8 (05/22/2007)   LDL: 87 (05/22/2007)   TG: 106 (05/22/2007)   Problem # 7:  HYPERTENSION (ICD-401.9) Assessment: Unchanged Home nos marvelous...will follow. His updated medication list for this problem includes:    Accupril 40 Mg Tabs (Quinapril hcl) .Marland Kitchen... Take one by mouth once a day    Spironolactone 25 Mg Tabs (Spironolactone) .Marland Kitchen... Take one by mouth once a day     Caduet 5-10 Mg Tabs (Amlodipine-atorvastatin) .Marland Kitchen... Take one by mouth at bedtime  BP today: 140/80  Labs Reviewed: Creat: 1.1 (05/22/2007) Chol: 141 (05/22/2007)   HDL: 32.8 (05/22/2007)   LDL: 87 (05/22/2007)   TG: 106 (05/22/2007)   Complete Medication List: 1)  Klor-con 10 10 Meq Tbcr (Potassium chloride) .... Take 6 tablets daily 2)  Accupril 40 Mg Tabs (Quinapril hcl) .... Take one by mouth once a day 3)  Synthroid 112 Mcg Tabs (Levothyroxine sodium) .... Take one by mouth once a day 4)  Spironolactone 25 Mg Tabs (Spironolactone) .... Take one by mouth once a day 5)  Caduet 5-10 Mg Tabs (Amlodipine-atorvastatin) .... Take one by mouth at bedtime 6)  Anamantle Hc Forte 3-1 % Kit (Lidocaine-hydrocortisone ace) .... Use as directed   Patient Instructions: 1)  RTC 6 mos, BMET prior 2)  Eventually screen for AAA due to father's disease in the future.    Prescriptions: ANAMANTLE HC FORTE 3-1 %  KIT (LIDOCAINE-HYDROCORTISONE ACE) use as directed  #1box x 3   Entered by:   Providence Crosby   Authorized by:   Shaune Leeks MD   Signed by:   Providence Crosby on 06/05/2007   Method used:   Print then Give to Patient   RxID:   [id number] CADUET 5-10 MG  TABS (AMLODIPINE-ATORVASTATIN) Take one by mouth at bedtime  #90 x 4   Entered by:   Providence Crosby   Authorized by:   Shaune Leeks MD   Signed by:   Providence Crosby on 06/05/2007   Method used:   Print then Give to Patient   RxID:   [id number] SPIRONOLACTONE 25 MG  TABS (SPIRONOLACTONE) Take one by mouth once a day  #90 x 4   Entered by:   Providence Crosby   Authorized by:   Shaune Leeks MD   Signed by:   Providence Crosby on 06/05/2007   Method used:   Print then Give to Patient   RxID:   [id number] SYNTHROID 112 MCG  TABS (LEVOTHYROXINE SODIUM) Take one by mouth once a day  #90 x 4   Entered by:   Providence Crosby  Authorized by:   Shaune Leeks MD   Signed by:   Providence Crosby on 06/05/2007   Method used:    Print then Give to Patient   RxID:   [id number] ACCUPRIL 40 MG  TABS (QUINAPRIL HCL) Take one by mouth once a day  #90 x 4   Entered by:   Providence Crosby   Authorized by:   Shaune Leeks MD   Signed by:   Providence Crosby on 06/05/2007   Method used:   Print then Give to Patient   RxID:   [id number] KLOR-CON 10 10 MEQ  TBCR (POTASSIUM CHLORIDE) Take 6 tablets daily  #540 x 4   Entered by:   Providence Crosby   Authorized by:   Shaune Leeks MD   Signed by:   Providence Crosby on 06/05/2007   Method used:   Print then Give to Patient   RxID:   [id number]  ]  Influenza Immunization History:    Influenza # 1:  Fluvax 3+ (05/30/2007)

## 2010-08-30 NOTE — Assessment & Plan Note (Signed)
Summary: 6 M F/U DLO   Vital Signs:  Patient profile:   60 year old male Height:      73 inches Weight:      239 pounds BMI:     31.65 Temp:     97.9 degrees F oral Pulse rate:   76 / minute Pulse rhythm:   regular BP sitting:   120 / 80  (left arm) Cuff size:   large  Vitals Entered By: Providence Crosby (Dec 09, 2008 2:41 PM) CC: 6 month followup   History of Present Illness: Pt here for 6 month recheck. Still has sm amt blood with wiping of BM. Otherwise is doing well. Is running 2 miles a day and lifting more weights than previously. Feels great.   Allergies (verified): No Known Drug Allergies  Physical Exam  General:  Well-developed,well-nourished,in no acute distress; alert,appropriate and cooperative throughout examination Head:  Normocephalic and atraumatic without obvious abnormalities. No apparent alopecia or balding. Eyes:  Conjunctiva clear bilaterally.  Ears:  External ear exam shows no significant lesions or deformities.  Otoscopic examination reveals clear canals, tympanic membranes are intact bilaterally without bulging, retraction, inflammation or discharge. Hearing is grossly normal bilaterally. Nose:  External nasal examination shows no deformity or inflammation. Nasal mucosa are pink and moist without lesions or exudates. Mouth:  Oral mucosa and oropharynx without lesions or exudates.  Teeth in good repair. Neck:  No deformities, masses, or tenderness noted. Chest Wall:  No deformities, masses, tenderness or gynecomastia noted. Lungs:  Normal respiratory effort, chest expands symmetrically. Lungs are clear to auscultation, no crackles or wheezes. Heart:  Normal rate and regular rhythm. S1 and S2 normal without gallop, murmur, click, rub or other extra sounds. Abdomen:  Bowel sounds positive,abdomen soft and non-tender without masses, organomegaly or hernias noted. Msk:  No deformity or scoliosis noted of thoracic or lumbar spine.   Extremities:  No clubbing,  cyanosis, edema, or deformity noted with normal full range of motion of all joints.     Impression & Recommendations:  Problem # 1:  DISORDER, CARBOHYDRATE METABOLISM NOS (ICD-271.9) Assessment Unchanged Stable. Has lost weight and kept it off.  Problem # 2:  HYPOKALEMIA, SECONDARY HCTZ, AVOID (ICD-276.8) Assessment: Unchanged Stable now for quite some time.  Problem # 3:  HYPOTHYROIDISM (ICD-244.9) Assessment: Unchanged Stable. His updated medication list for this problem includes:    Synthroid 112 Mcg Tabs (Levothyroxine sodium) .Marland Kitchen... Take one by mouth once a day  Labs Reviewed: TSH: 4.56 (06/02/2008)    Chol: 130 (06/02/2008)   HDL: 39.9 (06/02/2008)   LDL: 71 (06/02/2008)   TG: 95 (06/02/2008)  Problem # 4:  HYPERTENSION (ICD-401.9) Assessment: Unchanged Good control. Cont curr meds. His updated medication list for this problem includes:    Accupril 40 Mg Tabs (Quinapril hcl) .Marland Kitchen... Take one by mouth once a day    Spironolactone 25 Mg Tabs (Spironolactone) .Marland Kitchen... Take one by mouth once a day    Caduet 5-10 Mg Tabs (Amlodipine-atorvastatin) .Marland Kitchen... Take one by mouth at bedtime  BP today: 120/80 Prior BP: 130/80 (06/11/2008)  Labs Reviewed: K+: 3.7 (12/02/2008) Creat: : 1.1 (12/02/2008)   Chol: 130 (06/02/2008)   HDL: 39.9 (06/02/2008)   LDL: 71 (06/02/2008)   TG: 95 (06/02/2008)  Complete Medication List: 1)  Klor-con 10 10 Meq Tbcr (Potassium chloride) .... Take 6 tablets daily 2)  Accupril 40 Mg Tabs (Quinapril hcl) .... Take one by mouth once a day 3)  Synthroid 112 Mcg Tabs (Levothyroxine sodium) .Marland KitchenMarland KitchenMarland Kitchen  Take one by mouth once a day 4)  Spironolactone 25 Mg Tabs (Spironolactone) .... Take one by mouth once a day 5)  Caduet 5-10 Mg Tabs (Amlodipine-atorvastatin) .... Take one by mouth at bedtime 6)  Anamantle Hc Forte 3-1 % Kit (Lidocaine-hydrocortisone ace) .... Use as directed  Patient Instructions: 1)  RTC 6 mos Comp Exam, labs prior

## 2010-08-30 NOTE — Progress Notes (Signed)
----   Converted from flag ---- ---- 06/14/2010 1:55 PM, Crawford Givens MD wrote: tsh 244.9 cmet/lipid 272.0 psa v76.44   ---- 06/14/2010 8:08 AM, Liane Comber CMA (AAMA) wrote: Lab orders please! Good Morning! This pt is scheduled for cpx labs [place], which labs to draw and dx codes to use? Thanks Tasha ------------------------------

## 2010-08-30 NOTE — Progress Notes (Signed)
Summary: refill request from [facility]  Phone Note Refill Request Message from:  Fax from Pharmacy  Refills Requested: Medication #1:  CADUET 5-10 MG  TABS Take one by mouth at bedtime Faxed form from [facility] is on your shelf.  Initial call taken by: Lowella Petties,  August 24, 2008 4:04 PM  Follow-up for Phone Call        Forms faxed Follow-up by: Lowella Petties,  August 24, 2008 5:05 PM      Prescriptions: CADUET 5-10 MG  TABS (AMLODIPINE-ATORVASTATIN) Take one by mouth at bedtime  #90 x 4   Entered and Authorized by:   Shaune Leeks MD   Signed by:   Shaune Leeks MD on 08/24/2008   Method used:   Print then Give to Patient   RxID:   [id number]

## 2010-08-30 NOTE — Assessment & Plan Note (Signed)
Summary: 6 m f/u  dlo   Vital Signs:  Patient Profile:   60 Years Old Male Height:     73.75 inches Weight:      265 pounds Temp:     97.9 degrees F tympanic Pulse rate:   88 / minute Pulse rhythm:   regular BP sitting:   130 / 80  (left arm) Cuff size:   large  Vitals Entered By: Providence Crosby (November 28, 2007 2:39 PM)                 Chief Complaint:  6 MONTH FOLLOWUP.  History of Present Illness: Here for followup, doing well. Sugar is now routinely elevated 110s. Mother was diabetic. Feels well and is as active as ever.    Prior Medications Reviewed Using: Patient Recall  Current Allergies: No known allergies       Physical Exam  General:     Well-developed,well-nourished,in no acute distress; alert,appropriate and cooperative throughout examination Head:     Normocephalic and atraumatic without obvious abnormalities. No apparent alopecia or balding. Eyes:     Conjunctiva clear bilaterally.  Ears:     External ear exam shows no significant lesions or deformities.  Otoscopic examination reveals clear canals, tympanic membranes are intact bilaterally without bulging, retraction, inflammation or discharge. Hearing is grossly normal bilaterally. Nose:     External nasal examination shows no deformity or inflammation. Nasal mucosa are pink and moist without lesions or exudates. Mouth:     Oral mucosa and oropharynx without lesions or exudates.  Teeth in good repair. Neck:     No deformities, masses, or tenderness noted. Chest Wall:     No deformities, masses, tenderness or gynecomastia noted. Lungs:     Normal respiratory effort, chest expands symmetrically. Lungs are clear to auscultation, no crackles or wheezes. Heart:     Normal rate and regular rhythm. S1 and S2 normal without gallop, murmur, click, rub or other extra sounds. Abdomen:     Bowel sounds positive,abdomen soft and non-tender without masses, organomegaly or hernias noted.    Impression &  Recommendations:  Problem # 1:  HYPERGLYCEMIA (ICD-790.29) Assessment: Deteriorated Worse but still not diabetic...discussed at length.Marland KitchenMarland KitchenRead Protein Power and Diabetes Solution. Avoid sweets and carbs.  Problem # 2:  HYPOKALEMIA, SECONDARY HCTZ, AVOID (ICD-276.8) Assessment: Improved Continues nml.  Problem # 3:  HYPERCHOLESTEROLEMIA (238/LDL 158) (ICD-272.0) Assessment: Unchanged Discussed. His updated medication list for this problem includes:    Caduet 5-10 Mg Tabs (Amlodipine-atorvastatin) .Marland Kitchen... Take one by mouth at bedtime  Labs Reviewed: Chol: 141 (05/22/2007)   HDL: 32.8 (05/22/2007)   LDL: 87 (05/22/2007)   TG: 106 (05/22/2007) SGOT: 22 (05/22/2007)   SGPT: 27 (05/22/2007)   Problem # 4:  HYPOTHYROIDISM (ICD-244.9) Assessment: Unchanged Stable. His updated medication list for this problem includes:    Synthroid 112 Mcg Tabs (Levothyroxine sodium) .Marland Kitchen... Take one by mouth once a day   Problem # 5:  HYPERTENSION (ICD-401.9) Assessment: Improved  His updated medication list for this problem includes:    Accupril 40 Mg Tabs (Quinapril hcl) .Marland Kitchen... Take one by mouth once a day    Spironolactone 25 Mg Tabs (Spironolactone) .Marland Kitchen... Take one by mouth once a day    Caduet 5-10 Mg Tabs (Amlodipine-atorvastatin) .Marland Kitchen... Take one by mouth at bedtime  BP today: 130/80 Prior BP: 140/80 (06/05/2007)  Labs Reviewed: Creat: 1.0 (11/22/2007) Chol: 141 (05/22/2007)   HDL: 32.8 (05/22/2007)   LDL: 87 (05/22/2007)   TG: 106 (05/22/2007)  Complete Medication List: 1)  Klor-con 10 10 Meq Tbcr (Potassium chloride) .... Take 6 tablets daily 2)  Accupril 40 Mg Tabs (Quinapril hcl) .... Take one by mouth once a day 3)  Synthroid 112 Mcg Tabs (Levothyroxine sodium) .... Take one by mouth once a day 4)  Spironolactone 25 Mg Tabs (Spironolactone) .... Take one by mouth once a day 5)  Caduet 5-10 Mg Tabs (Amlodipine-atorvastatin) .... Take one by mouth at bedtime 6)  Anamantle Hc Forte 3-1 %  Kit (Lidocaine-hydrocortisone ace) .... Use as directed   Patient Instructions: 1)  RTC 6 mos, COMP EXAM, labs prior    ]

## 2010-08-30 NOTE — Letter (Signed)
Summary: Results Follow up Letter  [place] at [facility]  [address] [place], [place] [address]   Phone: [phone]  Fax: [phone]    07/15/2008 MRN: [id number]  [facility] [place] [address] [place], [place]  [address]  Dear Mr. BRINER,  The following are the results of your recent test(s):  Test         Result    Pap Smear:        Normal _____  Not Normal _____ Comments: ______________________________________________________ Cholesterol: LDL(Bad cholesterol):         Your goal is less than:         HDL (Good cholesterol):       Your goal is more than: Comments:  ______________________________________________________ Mammogram:        Normal _____  Not Normal _____ Comments:  ___________________________________________________________________ Hemoccult:        Normal __X___  Not normal _______ Comments:    NO BLOOD IN STOOL. THANK YOU FOR RETURNING THE HEMOCCULT CARDS. PLEASE MAKE SURE TO REPEAT IN 0NE YEAR.  _____________________________________________________________________ Other Tests:    We routinely do not discuss normal results over the telephone.  If you desire a copy of the results, or you have any questions about this information we can discuss them at your next office visit.   Sincerely,

## 2010-08-30 NOTE — Letter (Signed)
Summary: Results Follow up Letter  [place] at [facility]  [address] [place], [place] [address]   Phone: [phone]  Fax: [phone]    07/01/2007 MRN: [id number]  [facility] [place] [address] [place], [place]  [address]  Dear Mr. LEPAGE,  The following are the results of your recent test(s):  Test         Result    Pap Smear:        Normal _____  Not Normal _____ Comments: ______________________________________________________ Cholesterol: LDL(Bad cholesterol):         Your goal is less than:         HDL (Good cholesterol):       Your goal is more than: Comments:  ______________________________________________________ Mammogram:        Normal _____  Not Normal _____ Comments:  ___________________________________________________________________ Hemoccult:        Normal _X____  Not normal _______ Comments: NO BLOOD IN STOOL. THANK YOU FOR RETURNING THE HEMOCULT CARDS. PLEASE MAKE SURE TO REPEAT IN ONE YEAR.   _____________________________________________________________________ Other Tests:    We routinely do not discuss normal results over the telephone.  If you desire a copy of the results, or you have any questions about this information we can discuss them at your next office visit.   Sincerely,

## 2010-08-30 NOTE — Miscellaneous (Signed)
Summary: Dr Kathrine Haddock  Dr Kathrine Haddock   Imported By: Beau Fanny 11/29/2007 11:05:34  _____________________________________________________________________  External Attachment:    Type:   Image     Comment:   External Document

## 2010-08-30 NOTE — Letter (Signed)
Summary: BP RECORDS AND HEIGHT WEIGHT CHART  BP RECORDS AND HEIGHT WEIGHT CHART   Imported By: Carin Primrose 06/12/2008 14:32:20  _____________________________________________________________________  External Attachment:    Type:   Image     Comment:   External Document

## 2010-08-30 NOTE — Letter (Signed)
Summary: Company Physical Exam  Company Physical Exam   Imported By: Beau Fanny 06/05/2007 13:52:31  _____________________________________________________________________  External Attachment:    Type:   Image     Comment:   External Document

## 2010-08-30 NOTE — Medication Information (Signed)
Summary: [facility] -New rx for Anusol-HC suppository 25mg .  [facility] -New rx for Anusol-HC suppository 25mg .   Imported By: Beau Fanny 09/27/2009 09:36:51  _____________________________________________________________________  External Attachment:    Type:   Image     Comment:   External Document

## 2010-08-30 NOTE — Assessment & Plan Note (Signed)
Summary: Carlos Foster patient   Vital Signs:  Patient profile:   60 year old male Height:      73 inches Weight:      255 pounds BMI:     33.76 Temp:     98.5 degrees F oral Pulse rate:   72 / minute Pulse rhythm:   regular BP sitting:   146 / 102  (left arm) Cuff size:   large  Vitals Entered By: Delilah Shan CMA Payne Garske Dull) (June 21, 2010 11:31 AM) CC: CPX / RNS pt., Preventive Care   History of Present Illness: CPE- See prev med.   Hypertension:      Using medication without problems or lightheadedness: yes Chest pain with exertion:no Edema:no Short of breath:no Average home BPs: usually controlled.   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    Elevated Cholesterol: Using medications without problems:yes Muscle aches: no Other complaints:no  hypothyroidism- compliant with meds.    Some heartburn after drinking soda.  Better after cutting out soda.    Allergies: No Known Drug Allergies  Past History:  Past Medical History: Last updated: 05/14/2007 Hypertension Hypothyroidism  Past Surgical History: Last updated: 09/30/2009 Morton's Neuroma Repair 1982 Hemmorhoidectomy 1982 Colonoscopy wnl except int hemms  12/24/98            10 years Laminectomy C4/5 5/6 6/7 Fusion (Dr. Jeral Fruit) 12/02/99 ETT, wnl ST dereased < 1mm V6 04/15/03 Prostate bx (Wrenn)  Benign  12/10/06 Colonoscopy Nodular Mucosa at [facility]  B9  Int Hemms (Dr Arlyce Dice)  09/24/2009           10 years  Family History: Reviewed history from 06/17/2009 and no changes required. Father: dec (9/08)  Ruptured AAA Mother: dec  acute kidney failure,CAD, HTN,DM,Diab Retinop, Toe Amputation Sister A 60 CV + CAD mother HBP + Mother DM + Mother, GM Lymphoma + M Uncle x 2 Depression - none ETOH/Drug abuse - none Strokes - none  Social History: Reviewed history from 05/14/2007 and no changes required. 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  Is a Agricultural consultant for [employer]. works out at [employer], 4-5 times a week never smoked alcohol: very rare  Review of Systems       See HPI.  Otherwise negative.    Physical Exam  General:  GEN: nad, alert and oriented HEENT: mucous membranes moist NECK: supple w/o LA CV: rrr.  no murmur PULM: ctab, no inc wob ABD: soft, +bs EXT: no edema SKIN: no acute rash  Rectal:  No external abnormalities noted except for external hemorrhoids. Normal sphincter tone. No rectal masses or tenderness. Prostate:  Prostate gland firm and smooth, mild enlargement, but nonodularity, tenderness, mass, asymmetry or induration.   Impression & Recommendations:  Problem # 1:  HEALTH MAINTENANCE EXAM (ICD-V70.0) See prev med below. continue exercise.   Problem # 2:  HYPERCHOLESTEROLEMIA (238/LDL 158) (ICD-272.0) labs reviewed.  no change in meds.  His updated medication list for this problem includes:    Caduet 5-10 Mg Tabs (Amlodipine-atorvastatin) .Marland Kitchen... Take one by mouth at bedtime  Problem # 3:  HYPOTHYROIDISM (ICD-244.9) labs reviewed.  no change in meds.  His updated medication list for this problem includes:    Synthroid 112 Mcg Tabs (Levothyroxine sodium) .Marland Kitchen... Take one by mouth once a day  Problem #  4:  HYPERTENSION (ICD-401.9) labs reviewed.  no change in meds.  His updated medication list for this problem includes:    Accupril 40 Mg Tabs (Quinapril hcl) .Marland Kitchen... Take one by mouth once a day    Spironolactone 25 Mg Tabs (Spironolactone) .Marland Kitchen... Take one by mouth once a day    Caduet 5-10 Mg Tabs (Amlodipine-atorvastatin) .Marland Kitchen... Take one by mouth at bedtime  Problem # 5:  SCREENING FOR MALIGNANT NEOPLASM, PROSTATE (ICD-V76.44) No nodularity and prev bx neg.  D/w patient ZO:XWRU and cons of PSA.  No other indication for intervention at this time.    Complete Medication List: 1)  Klor-con 10 10 Meq Tbcr (Potassium chloride) .... Take 6 tablets daily 2)  Accupril 40 Mg Tabs (Quinapril hcl) .... Take one by mouth once a day 3)  Synthroid 112 Mcg Tabs (Levothyroxine sodium) .... Take one by mouth once a day 4)  Spironolactone 25 Mg Tabs (Spironolactone) .... Take one by mouth once a day 5)  Caduet 5-10 Mg Tabs (Amlodipine-atorvastatin) .... Take one by mouth at bedtime 6)  Anamantle Hc Forte 3-1 % Kit (Lidocaine-hydrocortisone ace) .... Use as directed  Other Orders: Pneumococcal Vaccine (04540) Admin 1st Vaccine (98119)  Colorectal Screening:  Current Recommendations:    Hemoccult: NEG X 1 today  PSA Screening:    PSA: 3.23  (06/16/2010)  Immunization & Chemoprophylaxis:    Tetanus vaccine: Td  (04/01/2003)    Influenza vaccine: Historical  (03/31/2010)    Pneumovax: Pneumovax  (06/21/2010)  Patient Instructions: 1)  I would get a physical next year.  If you've had your labs done at work, let us know and we may not need to draw other labs before the visit.  Keep exercising and take care.  Let me know if you have other concerns (or if you BP stays elevated). Prescriptions: CADUET 5-10 MG  TABS (AMLODIPINE-ATORVASTATIN) Take one by mouth at bedtime  #90 x 4   Entered and Authorized by:   Crawford Givens MD   Signed by:   Crawford Givens MD on 06/21/2010   Method used:   Faxed to ...       [facility] (mail-order)             , [place]         Ph: [phone]       Fax: [phone]   RxID:   [id number] SPIRONOLACTONE 25 MG  TABS (SPIRONOLACTONE) Take one by mouth once a day  #90 x 4   Entered and Authorized by:   Crawford Givens MD   Signed by:   Crawford Givens MD on 06/21/2010   Method used:   Faxed to ...       [facility] (mail-order)             , [place]         Ph: [phone]       Fax: [phone]   RxID:   [id number] SYNTHROID 112 MCG  TABS (LEVOTHYROXINE SODIUM) Take one by mouth once a day  #90 x 4   Entered and  Authorized by:   Crawford Givens MD   Signed by:   Crawford Givens MD on 06/21/2010   Method used:   Faxed to ...       [facility] (mail-order)             , [place]         Ph: [phone]       Fax: [phone]   RxID:  [id number] ACCUPRIL 40 MG  TABS (QUINAPRIL HCL) Take one by mouth once a day  #90 x 4   Entered and Authorized by:   Crawford Givens MD   Signed by:   Crawford Givens MD on 06/21/2010   Method used:   Faxed to ...       [facility] (mail-order)             , [place]         Ph: [phone]       Fax: [phone]   RxID:   [id number] KLOR-CON 10 10 MEQ  TBCR (POTASSIUM CHLORIDE) Take 6 tablets daily  #540 x 4   Entered and Authorized by:   Crawford Givens MD   Signed by:   Crawford Givens MD on 06/21/2010   Method used:   Faxed to ...       [facility] (mail-order)             , [place]         Ph: [phone]       Fax: [phone]   RxID:   [id number]    Orders Added: 1)  Est. Patient Level IV [06237] 2)  New Patient 40-64 years [99386] 3)  Pneumococcal Vaccine [90732] 4)  Admin 1st Vaccine [90471]   Immunization History:  Influenza Immunization History:    Influenza:  historical (03/31/2010)  Immunizations Administered:  Pneumonia Vaccine:    Vaccine Type: Pneumovax    Site: left deltoid    Mfr: Merck    Dose: 0.5 ml    Route: IM    Given by: Delilah Shan CMA (AAMA)    Exp. Date: 11/25/2011    Lot #: [id number]    VIS given: 07/05/09 version given June 21, 2010.   Immunization History:  Influenza Immunization History:    Influenza:  Historical (03/31/2010)  Immunizations Administered:  Pneumonia Vaccine:    Vaccine Type: Pneumovax    Site: left deltoid    Mfr: Merck    Dose: 0.5 ml    Route: IM    Given by: Delilah Shan CMA (AAMA)    Exp. Date: 11/25/2011    Lot #: [id number]    VIS given: 07/05/09 version given June 21, 2010.  Current Allergies (reviewed today): No known allergies        Prevention & Chronic Care Immunizations    Influenza vaccine: Historical  (03/31/2010)    Tetanus booster: 04/01/2003: Td    Pneumococcal vaccine: Pneumovax  (06/21/2010)  Colorectal Screening   Hemoccult: negative  (07/28/2009)   Hemoccult action/deferral: NEG X 1 today  (06/21/2010)    Colonoscopy: DONE  (09/24/2009)   Colonoscopy due: 09/2019  Other Screening   PSA: 3.23  (06/16/2010)   Smoking status: never  (06/17/2009)  Lipids   Total Cholesterol: 157  (06/16/2010)   LDL: 99  (06/16/2010)   LDL Direct: Not documented   HDL: 43.80  (06/16/2010)   Triglycerides: 73.0  (06/16/2010)    SGOT (AST): 28  (06/16/2010)   SGPT (ALT): 32  (06/16/2010)   Alkaline phosphatase: 53  (06/16/2010)   Total bilirubin: 1.9  (06/16/2010)    Lipid flowsheet reviewed?: Yes   Progress toward LDL goal: At goal  Hypertension   Last Blood Pressure: 146 / 102  (06/21/2010)   Serum creatinine: 1.0  (06/16/2010)   Serum potassium 3.9  (06/16/2010)    Hypertension flowsheet reviewed?: Yes   Hypertension comments: see patient instructions  Self-Management Support :    Hypertension self-management support:  Not documented    Lipid self-management support: Not documented

## 2010-08-30 NOTE — Miscellaneous (Signed)
Summary: Lec previsit  Clinical Lists Changes  Medications: Added new medication of MOVIPREP 100 GM  SOLR (PEG-KCL-NACL-NASULF-NA ASC-C) As per prep instructions. - Signed Rx of MOVIPREP 100 GM  SOLR (PEG-KCL-NACL-NASULF-NA ASC-C) As per prep instructions.;  #1 x 0;  Signed;  Entered by: Ulis Rias RN;  Authorized by: Louis Meckel MD;  Method used: Electronically to [id number] [address] [place], [place], [place]  [address], Ph: [phone], Fax: [phone] Observations: Added new observation of NKA: T (09/17/2009 10:33)    Prescriptions: MOVIPREP 100 GM  SOLR (PEG-KCL-NACL-NASULF-NA ASC-C) As per prep instructions.  #1 x 0   Entered by:   Ulis Rias RN   Authorized by:   Louis Meckel MD   Signed by:   Ulis Rias RN on 09/17/2009   Method used:   [facility] [address] [id number]       [address]       [place], [place]  [address]       Ph: [phone]       Fax: [phone]   RxID:   [id number]

## 2010-08-30 NOTE — Progress Notes (Signed)
Summary: [facility]/Dr.Wrenn  [facility]/Dr.Wrenn   Imported By: Eleonore Chiquito 07/09/2007 07:53:37  _____________________________________________________________________  External Attachment:    Type:   Image     Comment:   External Document

## 2010-08-30 NOTE — Progress Notes (Signed)
Summary: anamantle hc is unavailable  Phone Note Refill Request Message from:  Fax from Pharmacy  Refills Requested: Medication #1:  ANAMANTLE HC FORTE 3-1 %  KIT use as directed This is unavailable from manufacturer, form from [facility] is on your shelf.  Initial call taken by: Lowella Petties CMA,  September 15, 2009 9:03 AM Caller: [facility]  Follow-up for Phone Call        Faxed form. Follow-up by: Lowella Petties CMA,  September 15, 2009 11:25 AM    New/Updated Medications: ANUSOL-HC 25 MG SUPP (HYDROCORTISONE ACETATE) one supp per rectum three times a day as needed hemms. Prescriptions: ANUSOL-HC 25 MG SUPP (HYDROCORTISONE ACETATE) one supp per rectum three times a day as needed hemms.  #30 x 6   Entered and Authorized by:   Shaune Leeks MD   Signed by:   Shaune Leeks MD on 09/15/2009   Method used:   Printed then faxed to ...         RxID:   [id number]

## 2010-08-30 NOTE — Assessment & Plan Note (Signed)
Summary: cpx /rbh   Vital Signs:  Patient Profile:   60 Years Old Male Height:     73. inches Weight:      239 pounds Temp:     97.5 degrees F oral Pulse rate:   72 / minute Pulse rhythm:   regular BP sitting:   130 / 80  (left arm) Cuff size:   large  Vitals Entered By: Providence Crosby (June 11, 2008 3:03 PM)                 Chief Complaint:  CHECK UP// HEMOCCULT CARDS TO PATIENT.  History of Present Illness: Pt here for Comp Exam, feel well with no acute complaints. Got motivated last time when seen and has lost almost 30 pounds by careful diet and continued exercise. He has regular exams and bloodwork due to being on the [facility] team, and as a result is very attuned to his medical condition and lab results. He is very healthy and works out regularly.     Prior Medications Reviewed Using: Patient Recall  Current Allergies: No known allergies    Family History:    Father: dec (9/08)  Ruptured AAA  polys    Mother: dec  acute kidney failure,CAD, HTN,DM,Diab Retinop, Toe Amputation    Sister A 59    CV + CAD mother    HBP + Mother    DM + Mother, GM    Prostate cancer + M Uncle x 2    Depression - none    ETOH/Drug abuse - none    Strokes - none   Risk Factors:     Counseled to quit/cut down alcohol use:  no   Review of Systems  General      Denies chills, fatigue, fever, loss of appetite, malaise, sleep disorder, sweats, weakness, and weight loss.  Eyes      Denies blurring, discharge, double vision, eye irritation, eye pain, halos, itching, light sensitivity, red eye, vision loss-1 eye, and vision loss-both eyes.  ENT      Denies decreased hearing, difficulty swallowing, ear discharge, earache, hoarseness, nasal congestion, nosebleeds, postnasal drainage, ringing in ears, sinus pressure, and sore throat.  CV       Denies bluish discoloration of lips or nails, chest pain or discomfort, difficulty breathing at night, difficulty breathing while lying down, fainting, fatigue, leg cramps with exertion, lightheadness, near fainting, palpitations, shortness of breath with exertion, swelling of feet, swelling of hands, and weight gain.  Resp      Denies chest discomfort, chest pain with inspiration, cough, coughing up blood, excessive snoring, hypersomnolence, morning headaches, pleuritic, shortness of breath, sputum productive, and wheezing.  GI      Complains of bloody stools.      Denies abdominal pain, change in bowel habits, constipation, dark tarry stools, diarrhea, excessive appetite, gas, hemorrhoids, indigestion, loss of appetite, nausea, vomiting, vomiting blood, and yellowish skin color.      rectal tear.  GU      Denies decreased libido, discharge, dysuria, erectile dysfunction, genital sores, hematuria, incontinence, nocturia, urinary frequency, and urinary hesitancy.  MS      Denies joint pain, joint redness, joint swelling, loss of strength, low back pain, mid back pain, muscle aches, muscle , cramps, muscle weakness, stiffness, and thoracic pain.  Neuro      Denies brief paralysis, difficulty with concentration, disturbances in coordination, falling down, headaches, inability to speak, memory loss, numbness, poor balance, seizures, sensation of room spinning, tingling, tremors,  visual disturbances, and weakness.   Physical Exam  General:     Well-developed,well-nourished,in no acute distress; alert,appropriate and cooperative throughout examination Head:     Normocephalic and atraumatic without obvious abnormalities. No apparent alopecia or balding. Eyes:     Conjunctiva clear bilaterally.  Ears:      External ear exam shows no significant lesions or deformities.  Otoscopic examination reveals clear canals, tympanic membranes are intact bilaterally without bulging, retraction, inflammation or discharge. Hearing is grossly normal bilaterally. Nose:     External nasal examination shows no deformity or inflammation. Nasal mucosa are pink and moist without lesions or exudates. Mouth:     Oral mucosa and oropharynx without lesions or exudates.  Teeth in good repair. Neck:     No deformities, masses, or tenderness noted. Chest Wall:     No deformities, masses, tenderness or gynecomastia noted. Breasts:     No masses or gynecomastia noted Lungs:     Normal respiratory effort, chest expands symmetrically. Lungs are clear to auscultation, no crackles or wheezes. Heart:     Normal rate and regular rhythm. S1 and S2 normal without gallop, murmur, click, rub or other extra sounds. Abdomen:     Bowel sounds positive,abdomen soft and non-tender without masses, organomegaly or hernias noted. Rectal:     No external abnormalities noted. Normal sphincter tone. No rectal masses or tenderness.  G neg. Genitalia:     Testes bilaterally descended without nodularity, tenderness or masses. No scrotal masses or lesions. No penis lesions or urethral discharge. Prostate:     Prostate gland firm and smooth, no enlargement, nodularity, tenderness, mass, asymmetry or induration. 30-40gms Msk:     No deformity or scoliosis noted of thoracic or lumbar spine.   Pulses:     R and L carotid,radial,femoral,dorsalis pedis and posterior tibial pulses are full and equal bilaterally Extremities:     No clubbing, cyanosis, edema, or deformity noted with normal full range of motion of all joints.   Neurologic:     No cranial nerve deficits noted. Station and gait are normal. Plantar reflexes are down-going bilaterally. DTRs are symmetrical throughout. Sensory, motor and coordinative functions appear intact. Skin:      Intact without suspicious lesions or rashes. A few sclerotic lesions scattered on the trunk and arms. Cervical Nodes:     No lymphadenopathy noted Inguinal Nodes:     No significant adenopathy Psych:     Cognition and judgment appear intact. Alert and cooperative with normal attention span and concentration. No apparent delusions, illusions, hallucinations    Impression & Recommendations:  Problem # 1:  HEALTH MAINTENANCE EXAM (ICD-V70.0) Assessment: Comment Only Needs flu shot which we no longer have here. He'll get H1N1 next week thru [facility].  Problem # 2:  DISORDER, CARBOHYDRATE METABOLISM NOS (ICD-271.9) Assessment: Improved Normal viaa labs this time...excellent job. Now challenge is to continue!  Problem # 3:  SCREENING FOR MALIGNANT NEOPLASM, PROSTATE (ICD-V76.44) Assessment: Unchanged Stable PSA and exam.  Problem # 4:  HYPOKALEMIA, SECONDARY HCTZ, AVOID (ICD-276.8) Assessment: Unchanged Mild on our labs, nml on his at work. Cont curr dose. Is familial in that sibs have same thing.  Problem # 5:  HYPERCHOLESTEROLEMIA (238/LDL 158) (ICD-272.0) Assessment: Unchanged Excellent cont Caduet. His updated medication list for this problem includes:    Caduet 5-10 Mg Tabs (Amlodipine-atorvastatin) .Marland Kitchen... Take one by mouth at bedtime  Labs Reviewed: Chol: 130 (06/02/2008)   HDL: 39.9 (06/02/2008)   LDL: 71 (06/02/2008)   TG: 95 (  06/02/2008) SGOT: 22 (06/02/2008)   SGPT: 24 (06/02/2008)   Problem # 6:  HYPOTHYROIDISM (ICD-244.9) Assessment: Unchanged Euthyroid on current dose. His updated medication list for this problem includes:    Synthroid 112 Mcg Tabs (Levothyroxine sodium) .Marland Kitchen... Take one by mouth once a day  Labs Reviewed: TSH: 4.56 (06/02/2008)    Chol: 130 (06/02/2008)   HDL: 39.9 (06/02/2008)   LDL: 71 (06/02/2008)   TG: 95 (06/02/2008)   Problem # 7:  HYPERTENSION (ICD-401.9) Assessment: Unchanged Stable.  His updated medication list for this problem includes:    Accupril 40 Mg Tabs (Quinapril hcl) .Marland Kitchen... Take one by mouth once a day    Spironolactone 25 Mg Tabs (Spironolactone) .Marland Kitchen... Take one by mouth once a day    Caduet 5-10 Mg Tabs (Amlodipine-atorvastatin) .Marland Kitchen... Take one by mouth at bedtime  BP today: 130/80 Prior BP: 130/80 (11/28/2007)  Labs Reviewed: Creat: 1.0 (06/02/2008) Chol: 130 (06/02/2008)   HDL: 39.9 (06/02/2008)   LDL: 71 (06/02/2008)   TG: 95 (06/02/2008)   Complete Medication List: 1)  Klor-con 10 10 Meq Tbcr (Potassium chloride) .... Take 6 tablets daily 2)  Accupril 40 Mg Tabs (Quinapril hcl) .... Take one by mouth once a day 3)  Synthroid 112 Mcg Tabs (Levothyroxine sodium) .... Take one by mouth once a day 4)  Spironolactone 25 Mg Tabs (Spironolactone) .... Take one by mouth once a day 5)  Caduet 5-10 Mg Tabs (Amlodipine-atorvastatin) .... Take one by mouth at bedtime 6)  Anamantle Hc Forte 3-1 % Kit (Lidocaine-hydrocortisone ace) .... Use as directed   Patient Instructions: 1)  RTC 6 mos, BMET prior 276.8   Prescriptions: ANAMANTLE HC FORTE 3-1 %  KIT (LIDOCAINE-HYDROCORTISONE ACE) use as directed  #1box x 3   Entered by:   Providence Crosby   Authorized by:   Shaune Leeks MD   Signed by:   Providence Crosby on 06/11/2008   Method used:   Print then Give to Patient   RxID:   [id number] CADUET 5-10 MG  TABS (AMLODIPINE-ATORVASTATIN) Take one by mouth at bedtime  #90 x 4   Entered by:   Providence Crosby   Authorized by:   Shaune Leeks MD   Signed by:   Providence Crosby on 06/11/2008   Method used:   Print then Give to Patient   RxID:   [id number] SPIRONOLACTONE 25 MG  TABS (SPIRONOLACTONE) Take one by mouth once a day  #90 x 4   Entered by:   Providence Crosby   Authorized by:   Shaune Leeks MD   Signed by:   Providence Crosby on 06/11/2008   Method used:   Print then Give to Patient   RxID:   [id number]  SYNTHROID 112 MCG  TABS (LEVOTHYROXINE SODIUM) Take one by mouth once a day  #90 x 4   Entered by:   Providence Crosby   Authorized by:   Shaune Leeks MD   Signed by:   Providence Crosby on 06/11/2008   Method used:   Print then Give to Patient   RxID:   [id number] ACCUPRIL 40 MG  TABS (QUINAPRIL HCL) Take one by mouth once a day  #90 x 4   Entered by:   Providence Crosby   Authorized by:   Shaune Leeks MD   Signed by:   Providence Crosby on 06/11/2008   Method used:   Print then Give to Patient   RxID:   [id number] KLOR-CON 10 10 MEQ  TBCR (POTASSIUM CHLORIDE) Take 6 tablets daily  #540 x 4   Entered by:   Providence Crosby   Authorized by:   Shaune Leeks MD   Signed by:   Providence Crosby on 06/11/2008   Method used:   Print then Give to Patient   RxID:   [id number]  ]

## 2010-08-30 NOTE — Letter (Signed)
Summary: Nadara Eaton letter  [place] at [facility]  [address] [place], [place] [address]   Phone: [phone]  Fax: [phone]       03/08/2010 MRN: [id number]  [facility] [address] [place], [place]  [address]  Dear Mr. Galen Manila,  [facility] [place], and [facility] announce the retirement of Arta Silence, M.D., from full-time practice at the [facility] office effective January 27, 2010 and his plans of returning part-time.  It is important to Dr. Hetty Ely and to our practice that you understand that [facility] has seven physicians in our office for your health care needs.  We will continue to offer the same exceptional care that you have today.    Dr. Hetty Ely has spoken to many of you about his plans for retirement and returning part-time in the fall.   We will continue to work with you through the transition to schedule appointments for you in the office and meet the high standards that [facility] is committed to.   Again, it is with great pleasure that we share the news that Dr. Hetty Ely will return to [facility] at [facility] in October of 2011 with a reduced schedule.    If you have any questions, or would like to request an appointment with one of our physicians, please call us at [phone] and press the option for Scheduling an appointment.  We take pleasure in providing you with excellent patient care and look forward to seeing you at your next office visit.  Our [facility] Physicians are:  Tillman Abide, M.D. Laurita Quint, M.D. Roxy Manns, M.D. Kerby Nora, M.D. Hannah Beat, M.D. Ruthe Mannan, M.D. We proudly welcomed Raechel Ache, M.D. and Eustaquio Boyden, M.D. to the practice in July/August 2011.  Sincerely,   [facility]

## 2010-08-30 NOTE — Procedures (Signed)
Summary: Colonoscopy  Patient: [name] Note: All result statuses are Final unless otherwise noted.  Tests: (1) Colonoscopy (COL)   COL Colonoscopy           DONE     [facility]     [address]     [place], [place]  [address]           COLONOSCOPY PROCEDURE REPORT           PATIENT:  Carlos Foster, Carlos Foster  MR#:  [id number]     BIRTHDATE:  Jul 24, 1951, 58 yrs. old  GENDER:  male           ENDOSCOPIST:  Barbette Hair. Arlyce Dice, MD     Referred by:  Laurita Quint, M.D.           PROCEDURE DATE:  09/24/2009     PROCEDURE:  Colonoscopy with biopsy     ASA CLASS:  Class II     INDICATIONS:  Routine Risk Screening           MEDICATIONS:   Fentanyl 50 mcg IV, Versed 7 mg IV           DESCRIPTION OF PROCEDURE:   After the risks benefits and     alternatives of the procedure were thoroughly explained, informed     consent was obtained.  No rectal exam performed. The LB CF-H180AL     [id number] endoscope was introduced through the anus and advanced to     the ileum, without limitations.  The quality of the prep was     excellent, using MoviPrep.  The instrument was then slowly     withdrawn as the colon was fully examined.     <<PROCEDUREIMAGES>>           FINDINGS:  Nodular mucosa was found at the ileocecal valve.     Multiple biopsies were obtained and sent to pathology (see image2     and image4).  Internal hemorrhoids were found (see image15).  This     was otherwise a normal examination of the colon (see image1,     image5, image6, image7, image9, image10, image11, image13,     image14, and image3).   Retroflexion was not performed.  The scope     was then withdrawn from the patient and the procedure completed.           COMPLICATIONS:  None           ENDOSCOPIC IMPRESSION:     1) Nodular mucosa at the ileocecal valve     2) Internal hemorrhoids     3) Otherwise normal examination     RECOMMENDATIONS:     1) Await biopsy results           REPEAT EXAM:   You will receive a  letter from Dr. Arlyce Dice in 1-2     weeks, after reviewing the final pathology, with followup     recommendations.           ______________________________     Barbette Hair Arlyce Dice, MD           CC:           n.     eSIGNED:   Barbette Hair. Aslynn Brunetti at 09/24/2009 11:25 AM           [name], [name], [phone]  Note: An exclamation mark (!) indicates a result that was not dispersed into the flowsheet. Document Creation Date: 09/24/2009 11:26 AM _______________________________________________________________________  (1) Order  result status: Final Collection or observation date-time: 09/24/2009 11:18 Requested date-time:  Receipt date-time:  Reported date-time:  Referring Physician:   Ordering Physician: Melvia Heaps [phone]) Specimen Source:  Source: Launa Grill Order Number: [id number] Lab site:   Appended Document: Colonoscopy f/u colonoscopy in 10 years  Appended Document: Colonoscopy     Procedures Next Due Date:    Colonoscopy: 09/2019  Appended Document: Colonoscopy    Clinical Lists Changes  Observations: Added new observation of PAST SURG HX: Morton's Neuroma Repair 1982 Hemmorhoidectomy 1982 Colonoscopy wnl except int hemms  12/24/98            10 years Laminectomy C4/5 5/6 6/7 Fusion (Dr. Jeral Fruit) 12/02/99 ETT, wnl ST dereased < 1mm V6 04/15/03 Prostate bx (Wrenn)  Benign  12/10/06 Colonoscopy Nodular Mucosa at Ileocecal Valve  B9  Int Hemms (Dr Arlyce Dice)  09/24/2009           10 years  (09/30/2009 14:08)       Past Surgical History:    Morton's Neuroma Repair 1982    Hemmorhoidectomy 1982    Colonoscopy wnl except int hemms  12/24/98            10 years    Laminectomy C4/5 5/6 6/7 Fusion (Dr. Jeral Fruit) 12/02/99    ETT, wnl ST dereased < 1mm V6 04/15/03    Prostate bx (Wrenn)  Benign  12/10/06    Colonoscopy Nodular Mucosa at Ileocecal Valve  B9  Int Hemms (Dr Arlyce Dice)  09/24/2009           10 years

## 2010-08-30 NOTE — Letter (Signed)
Summary: [facility] Instructions  [facility]  [address] [place], [place] [address]   Phone: [phone]  Fax: [phone]       SIMCHA SPEIR    1952/07/58    MRN: [id number]        Procedure Day /Date:  Friday 09/24/2009     Arrival Time: 9:30 am      Procedure Time: 10:30 am     Location of Procedure:                    _ x_  [facility] (4th Floor)  PREPARATION FOR COLONOSCOPY WITH MOVIPREP   Starting 5 days prior to your procedure Sunday 2/20 do not eat nuts, seeds, popcorn, corn, beans, peas,  salads, or any raw vegetables.  Do not take any fiber supplements (e.g. Metamucil, Citrucel, and Benefiber).  THE DAY BEFORE YOUR PROCEDURE         DATE: Thursday 2/24 1.  Drink clear liquids the entire day-NO SOLID FOOD  2.  Do not drink anything colored red or purple.  Avoid juices with pulp.  No orange juice.  3.  Drink at least 64 oz. (8 glasses) of fluid/clear liquids during the day to prevent dehydration and help the prep work efficiently.  CLEAR LIQUIDS INCLUDE: Water Jello Ice Popsicles Tea (sugar ok, no milk/cream) Powdered fruit flavored drinks Coffee (sugar ok, no milk/cream) Gatorade Juice: apple, white grape, white cranberry  Lemonade Clear bullion, consomm, broth Carbonated beverages (any kind) Strained chicken noodle soup Hard Candy                             4.  In the morning, mix first dose of MoviPrep solution:    Empty 1 Pouch A and 1 Pouch B into the disposable container    Add lukewarm drinking water to the top line of the container. Mix to dissolve    Refrigerate (mixed solution should be used within 24 hrs)  5.  Begin drinking the prep at 5:00 p.m. The MoviPrep container is divided by 4 marks.   Every 15 minutes drink the solution down to the next mark (approximately 8 oz) until the full liter is complete.   6.  Follow completed prep with 16 oz of clear liquid of your choice (Nothing red or purple).  Continue to  drink clear liquids until bedtime.  7.  Before going to bed, mix second dose of MoviPrep solution:    Empty 1 Pouch A and 1 Pouch B into the disposable container    Add lukewarm drinking water to the top line of the container. Mix to dissolve    Refrigerate  THE DAY OF YOUR PROCEDURE      DATE: Friday 2/25 Beginning at 5:30 am (5 hours before procedure):         1. Every 15 minutes, drink the solution down to the next mark (approx 8 oz) until the full liter is complete.  2. Follow completed prep with 16 oz. of clear liquid of your choice.    3. You may drink clear liquids until 8:30 am(2 HOURS BEFORE PROCEDURE).   MEDICATION INSTRUCTIONS  Unless otherwise instructed, you should take regular prescription medications with a small sip of water   as early as possible the morning of your procedure.  Additional medication instructions: Do not take fluid pill am of procedure         OTHER INSTRUCTIONS  You will need a responsible adult at least 58 years of age to accompany you and drive you home.   This person must remain in the waiting room during your procedure.  Wear loose fitting clothing that is easily removed.  Leave jewelry and other valuables at home.  However, you may wish to bring a book to read or  an iPod/MP3 player to listen to music as you wait for your procedure to start.  Remove all body piercing jewelry and leave at home.  Total time from sign-in until discharge is approximately 2-3 hours.  You should go home directly after your procedure and rest.  You can resume normal activities the  day after your procedure.  The day of your procedure you should not:   Drive   Make legal decisions   Operate machinery   Drink alcohol   Return to work  You will receive specific instructions about eating, activities and medications before you leave.    The above instructions have been reviewed and explained to me by   Ulis Rias RN  September 17, 2009 10:57  AM    I fully understand and can verbalize these instructions _____________________________ Date _________

## 2010-12-16 NOTE — H&P (Signed)
[place]  Patient:    Carlos Foster, Carlos Foster                           MRN: [id number] Adm. Date:  12/02/99 Attending:  Tanya Nones. Jeral Fruit, M.D.                         History and Physical  HISTORY:  Mr. Craine is a 60 year old gentleman, right handed, who for 3-4 years has been complaining of neck pain which essentially goes away with medication.  Right now the pain is getting worse.  It is going to the left shoulder, then all the way down to the hand, associated with some numbness sensation in the thumb,  index, and middle finger.  He also complains of weakness.  He has been unable to work.  For him, at nighttime it is quite difficult to fall asleep.  He denies any problem with the lower extremities or any problems with bladder or bowel.  PAST MEDICAL HISTORY:  Foot surgery and hemorrhoid surgery.  ALLERGIES:  He is not allergic to any medications.  SOCIAL HISTORY:  He does not smoke, does not drink.  He is 6 feet 2 inches and e weighs 240 pounds.  FAMILY HISTORY:  Mother is 40 years old, with a heart problem, diabetes, and high blood pressure.  Father is 30, with high blood pressure.  REVIEW OF SYSTEMS:  Except for irregular pulse, high cholesterol and blood pressure, is essentially negative.  MEDICATIONS:  He is taking some medication for thyroid.  Also for the high cholesterol, as well as for the blood pressure.  PHYSICAL EXAMINATION:  GENERAL:  The patient came to my office with his wife, and he was having quite  bit of neck limitation.  HEENT:  Normal.  NECK:  There are no bruits.  He is able to flex, but extension and lateralization produces pain and is quite painful.  LUNGS:  Clear.  HEART:  Heart sounds normal.  ABDOMEN:  Normal.  EXTREMITIES:  Normal pulses.  NEUROLOGIC:  Mental status normal.  Cranial nerves normal.  Strength is 5/5 except in the lower extremities.  In the right upper extremity is normal.  The  left one showed the deltoids 4/5, biceps 4/5, and the left triceps is 3/5, with the wrist extensor also being 3/5.  There is no weakness of the thenar or hypothenar muscles. Reflexes are 2+ with absence of the left triceps.  Sensation:  He complains of numbness which involves the thumb, index, and middle finger.  Coordination normal. The MRI from 1999 shows spondylosis at the level of 4-5, 5-6, and 6-7, but now he one done a few days ago showed that he has a large herniated disk at the level f C6-C7, with displacement of the thecal sac going to the left side.  He has another one at the level of 4-5 and spondylosis bilaterally at the level of 5-6.  CLINICAL IMPRESSION:  C6-C7 herniated disk with spondylosis at the level of 5-6 and herniated at the level of 4-5.  Chronic radiculopathy.  RECOMMENDATIONS:  The patient wants to proceed with surgery.  The procedure will be anterior cervical diskectomy at those three levels, followed by bone graft and fusion.  He and his wife know about the risks such as infection, CSF leak, worsening of the pain, paralysis, need for further surgery, damage to the vocal  cords, damage to the  esophagus, damage to the trachea. DD:  12/02/99 TD:  12/02/99 Job: [id number]

## 2010-12-16 NOTE — Discharge Summary (Signed)
[place]. [facility]  Patient:    Carlos Foster, Carlos Foster                         MRN: [id number] Adm. Date:  [id number] Disc. Date: [id number] Attending:  Danella Penton                           Discharge Summary  REASON FOR ADMISSION:  Herniated cervical disc with cervical myelopathy and cervical spondylosis with cervical radiculopathy.  FINAL DIAGNOSIS:  Herniated cervical disc with cervical myelopathy and cervical spondylosis with cervical radiculopathy.  HISTORY OF PRESENT ILLNESS AND HOSPITAL COURSE:  The patient is a 60 year old man with cervical spondylitic myelopathy and herniated cervical disc at C4-5, C5-6, C6-7 levels. It was elected to admit him to the hospital on a same day procedure basis for anterior cervical diskectomy and fusion at C4-5, C5-6 and C6-7 levels with allograft bone grafting and anterior cervical plating.  He tolerated this procedure well.  Postoperatively, he had good relief of his neck and upper extremity pain and weakness.  He was doing well on the morning of 5/5, although he was a little woozy following a dose of Demerol.  He is to be observed until later in the day and discharged later on Dec 03, 1999 if he tolerates oral analgesics with Percocet. The patient was discharged home with prescriptions for Percocet 5/325 one or two every 4 hours as needed for pain and Valium 5 mg up to every 6 hours as needed for muscular spasm.  He was instructed not to engage in any pulling, lifting or driving, wear the soft collar and not to soak the incision.  He is to follow up with Dr. Jeral Fruit in two weeks with lateral cervical spine x-ray. DD:  12/03/99 TD:  12/05/99 Job: [id number]

## 2010-12-16 NOTE — Op Note (Signed)
[place]  Patient:    Carlos Foster, Carlos Foster                         MRN: [id number] Proc. Date: 12/02/99 Adm. Date:  [id number] Disc. Date: [id number] Attending:  Danella Penton                           Operative Report  PREOPERATIVE DIAGNOSIS:  Cervical spondylosis and herniated disk at the level of C4-5, C5-6, C6-7.  POSTOPERATIVE DIAGNOSIS:  Cervical spondylosis and herniated disk at the level f C4-5, C5-6, C6-7.  PROCEDURE:  Anterior C4-5, C5-6, C6-7 diskectomy, with decompression of the spinal canal, bone bank graft, Synthes plate.  MICROSCOPE:  Midas Rex.  SURGEON:  Tanya Nones. Jeral Fruit, M.D.  ASSISTANT:  Alanson Aly. Roxan Hockey, M.D.  INDICATIONS:  Mr. Gratz is a gentleman who is admitted because of a history of  four years of neck pain which is getting worse lately, going to the left shoulder, associated with numbness of the thumb, index finger, and middle finger.  The x-rays show a herniated disk at the level of C6-C7 with spondylosis at the level of C5-6 and C4-5.  The patient wants to proceed with surgery.  He knew about the risks uch as infection, CSF leak, damage to the vocal cords or esophagus.  NOTATION:  This is the second dictation on this patient.  The first one was done on the day of surgery, but we cannot find the operative report.  DESCRIPTION OF PROCEDURE:  The patient was taken to the operating room and after intubation, the traction was applied to the neck.  The left side of the neck was prepped with Betadine.  A longitudinal incision through the skin, platysma, down to the cervical spine was done.  X-ray was done to localize the area.  We opened the anterior ligament at the level of C6-7, and with the help of the microscope we id a total gross diskectomy, with removal of the herniated disk and a decompression of the spinal cord.  At the level of C5-6 the same procedure was done, but we found mostly  spondylosis.  Then the same procedure was done at the level of C4-5.  At the end we had planned a decompression at the lower cervical level, with plenty of foraminotomy and plenty of decompression of the spinal cord.  Then a 6.0 mm bone graft at those three levels was inserted.  Then a Synthes plate using six screws was applied.  Lateral cervical spine showed good position of the plate and the graft.  From then on the levels were irrigated and closed with Vicryl and Steri-Strips.  The patient did well. DD:  01/12/00 TD:  01/12/00 Job: [id number]

## 2011-06-09 ENCOUNTER — Other Ambulatory Visit: Payer: Self-pay | Admitting: Family Medicine

## 2011-06-09 DIAGNOSIS — E039 Hypothyroidism, unspecified: Secondary | ICD-10-CM

## 2011-06-09 DIAGNOSIS — I10 Essential (primary) hypertension: Secondary | ICD-10-CM

## 2011-06-15 ENCOUNTER — Other Ambulatory Visit (INDEPENDENT_AMBULATORY_CARE_PROVIDER_SITE_OTHER): Payer: 59

## 2011-06-15 DIAGNOSIS — I10 Essential (primary) hypertension: Secondary | ICD-10-CM

## 2011-06-15 DIAGNOSIS — E039 Hypothyroidism, unspecified: Secondary | ICD-10-CM

## 2011-06-15 LAB — COMPREHENSIVE METABOLIC PANEL
ALT: 42 U/L (ref 0–53)
AST: 38 U/L — ABNORMAL HIGH (ref 0–37)
Albumin: 4 g/dL (ref 3.5–5.2)
Alkaline Phosphatase: 57 U/L (ref 39–117)
BUN: 13 mg/dL (ref 6–23)
CO2: 29 mEq/L (ref 19–32)
Calcium: 9.2 mg/dL (ref 8.4–10.5)
Chloride: 105 mEq/L (ref 96–112)
Creatinine, Ser: 1 mg/dL (ref 0.4–1.5)
GFR: 81.44 mL/min (ref >60.00)
Glucose, Bld: 101 mg/dL — ABNORMAL HIGH (ref 70–99)
Potassium: 2.9 mEq/L — ABNORMAL LOW (ref 3.5–5.1)
Sodium: 143 mEq/L (ref 135–145)
Total Bilirubin: 1.2 mg/dL (ref 0.3–1.2)
Total Protein: 7.1 g/dL (ref 6.0–8.3)

## 2011-06-15 LAB — LIPID PANEL
Cholesterol: 163 mg/dL (ref 0–200)
HDL: 43.9 mg/dL (ref >39.00)
LDL Cholesterol: 98 mg/dL (ref 0–99)
Total CHOL/HDL Ratio: 4
Triglycerides: 106 mg/dL (ref 0.0–149.0)
VLDL: 21.2 mg/dL (ref 0.0–40.0)

## 2011-06-16 ENCOUNTER — Telehealth: Payer: Self-pay | Admitting: Family Medicine

## 2011-06-16 DIAGNOSIS — E876 Hypokalemia: Secondary | ICD-10-CM

## 2011-06-16 LAB — TSH: TSH: 4.95 u[IU]/mL (ref 0.35–5.50)

## 2011-06-16 NOTE — Telephone Encounter (Signed)
LMOVM to return call.

## 2011-06-16 NOTE — Telephone Encounter (Signed)
Please call pt. K was low.  Before we change his meds, I want to recheck this.  Please have him repeat a K level next week, nonfasting.  Thanks.  I'll talk to him about the other labs at the OV.  Thanks.

## 2011-06-16 NOTE — Telephone Encounter (Signed)
Patient advised. Appt scheduled. 

## 2011-06-19 ENCOUNTER — Other Ambulatory Visit: Payer: Self-pay

## 2011-06-20 ENCOUNTER — Other Ambulatory Visit: Payer: Self-pay

## 2011-06-21 ENCOUNTER — Other Ambulatory Visit (INDEPENDENT_AMBULATORY_CARE_PROVIDER_SITE_OTHER): Payer: 59

## 2011-06-21 DIAGNOSIS — E876 Hypokalemia: Secondary | ICD-10-CM

## 2011-06-21 LAB — POTASSIUM: Potassium: 3.1 mEq/L — ABNORMAL LOW (ref 3.5–5.1)

## 2011-06-27 ENCOUNTER — Encounter: Payer: Self-pay | Admitting: Family Medicine

## 2011-07-05 ENCOUNTER — Encounter: Payer: Self-pay | Admitting: Family Medicine

## 2011-07-06 ENCOUNTER — Encounter: Payer: Self-pay | Admitting: Family Medicine

## 2011-07-06 ENCOUNTER — Ambulatory Visit (INDEPENDENT_AMBULATORY_CARE_PROVIDER_SITE_OTHER): Payer: 59 | Admitting: Family Medicine

## 2011-07-06 VITALS — BP 144/84 | HR 87 | Temp 98.5°F | Wt 264.0 lb

## 2011-07-06 DIAGNOSIS — Z1211 Encounter for screening for malignant neoplasm of colon: Secondary | ICD-10-CM

## 2011-07-06 DIAGNOSIS — Z0000 Encounter for general adult medical examination without abnormal findings: Secondary | ICD-10-CM

## 2011-07-06 DIAGNOSIS — E876 Hypokalemia: Secondary | ICD-10-CM

## 2011-07-06 DIAGNOSIS — Z23 Encounter for immunization: Secondary | ICD-10-CM

## 2011-07-06 DIAGNOSIS — Z9889 Other specified postprocedural states: Secondary | ICD-10-CM

## 2011-07-06 DIAGNOSIS — E039 Hypothyroidism, unspecified: Secondary | ICD-10-CM

## 2011-07-06 DIAGNOSIS — I10 Essential (primary) hypertension: Secondary | ICD-10-CM

## 2011-07-06 DIAGNOSIS — E785 Hyperlipidemia, unspecified: Secondary | ICD-10-CM

## 2011-07-06 LAB — POTASSIUM: Potassium: 3.7 mEq/L (ref 3.5–5.1)

## 2011-07-06 MED ORDER — SPIRONOLACTONE 25 MG PO TABS: 25.0000 mg | ORAL_TABLET | Freq: Every day | ORAL | Status: DC

## 2011-07-06 MED ORDER — AMLODIPINE-ATORVASTATIN 5-10 MG PO TABS: 1.0000 | ORAL_TABLET | Freq: Every day | ORAL | Status: DC

## 2011-07-06 MED ORDER — POTASSIUM CHLORIDE 10 MEQ PO TBCR: 60.0000 meq | EXTENDED_RELEASE_TABLET | Freq: Every day | ORAL | Status: DC

## 2011-07-06 MED ORDER — METRONIDAZOLE 1 % EX GEL: Freq: Every day | CUTANEOUS | Status: AC

## 2011-07-06 MED ORDER — LEVOTHYROXINE SODIUM 112 MCG PO TABS: 112.0000 ug | ORAL_TABLET | Freq: Every day | ORAL | Status: DC

## 2011-07-06 MED ORDER — QUINAPRIL HCL 40 MG PO TABS: 40.0000 mg | ORAL_TABLET | Freq: Every day | ORAL | Status: DC

## 2011-07-06 NOTE — Progress Notes (Signed)
CPE.  See plan.    Wife may have ovarian cancer.  She is in the midst of diagnostic w/u and he's worried about that.  He was laid off March 30th.  He's getting work with [employer].  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    Prev with neg PSA and he declined testing PSA today.  This is reasonable with neg FH of prostate CA.   Hypertension:    Using medication without problems or lightheadedness: yes Chest pain with exertion:no Edema:no Short of breath:no Average home WUJ:[id number].   Hypokalemia. See notes on labs.    Elevated Cholesterol: Using medications without problems:yes Muscle aches: no Diet compliance:yes Exercise:yes  Rosacea.  Needs refill on Metrogel.  Used episodically, good effect, no ADE.   Meds, vitals, and allergies reviewed.   PMH and SH reviewed  ROS: See HPI.  Otherwise negative.    GEN: nad, alert and oriented HEENT: mucous membranes moist NECK: supple w/o LA CV: rrr. PULM: ctab, no inc wob ABD: soft, +bs EXT: no edema SKIN: no acute rash

## 2011-07-06 NOTE — Patient Instructions (Signed)
You can get your results through our phone system.  Follow the instructions on the blue card. Take care.  Don't change your meds.   I would recheck your labs next year at a physical.

## 2011-07-07 ENCOUNTER — Telehealth: Payer: Self-pay | Admitting: Family Medicine

## 2011-07-07 ENCOUNTER — Encounter: Payer: Self-pay | Admitting: Family Medicine

## 2011-07-07 DIAGNOSIS — E785 Hyperlipidemia, unspecified: Secondary | ICD-10-CM | POA: Insufficient documentation

## 2011-07-07 DIAGNOSIS — Z0000 Encounter for general adult medical examination without abnormal findings: Secondary | ICD-10-CM | POA: Insufficient documentation

## 2011-07-07 DIAGNOSIS — Z9889 Other specified postprocedural states: Secondary | ICD-10-CM | POA: Insufficient documentation

## 2011-07-07 DIAGNOSIS — Z1211 Encounter for screening for malignant neoplasm of colon: Secondary | ICD-10-CM | POA: Insufficient documentation

## 2011-07-07 NOTE — Assessment & Plan Note (Signed)
Controlled , no change in meds 

## 2011-07-07 NOTE — Telephone Encounter (Signed)
Please call pt.  He had asked about AAA screening.  The usual indication for screening is for men 65-75 who prev smoked.  He wouldn't need screening at this time.  We can readdress this later on, ie if the guideline change.  Thanks.

## 2011-07-07 NOTE — Telephone Encounter (Signed)
Patient advised.

## 2011-07-07 NOTE — Assessment & Plan Note (Signed)
tsh wnl, no change in meds.

## 2011-07-07 NOTE — Assessment & Plan Note (Signed)
psa declined and this is reasonable given the neg FH and his history of neg biopsy.   Colonoscopy up to date.  Flu shot today.  Continue diet and exercise.

## 2012-06-07 ENCOUNTER — Ambulatory Visit (INDEPENDENT_AMBULATORY_CARE_PROVIDER_SITE_OTHER): Payer: BC Managed Care – PPO

## 2012-06-07 DIAGNOSIS — Z23 Encounter for immunization: Secondary | ICD-10-CM

## 2012-06-23 ENCOUNTER — Other Ambulatory Visit: Payer: Self-pay | Admitting: Family Medicine

## 2012-06-23 DIAGNOSIS — I10 Essential (primary) hypertension: Secondary | ICD-10-CM

## 2012-06-23 DIAGNOSIS — E039 Hypothyroidism, unspecified: Secondary | ICD-10-CM

## 2012-06-28 ENCOUNTER — Other Ambulatory Visit: Payer: 59

## 2012-07-01 ENCOUNTER — Other Ambulatory Visit (INDEPENDENT_AMBULATORY_CARE_PROVIDER_SITE_OTHER): Payer: BC Managed Care – PPO

## 2012-07-01 DIAGNOSIS — I10 Essential (primary) hypertension: Secondary | ICD-10-CM

## 2012-07-01 DIAGNOSIS — E039 Hypothyroidism, unspecified: Secondary | ICD-10-CM

## 2012-07-01 LAB — COMPREHENSIVE METABOLIC PANEL
ALT: 39 U/L (ref 0–53)
AST: 31 U/L (ref 0–37)
Albumin: 4.2 g/dL (ref 3.5–5.2)
Alkaline Phosphatase: 57 U/L (ref 39–117)
BUN: 15 mg/dL (ref 6–23)
CO2: 30 mEq/L (ref 19–32)
Calcium: 9.1 mg/dL (ref 8.4–10.5)
Chloride: 102 mEq/L (ref 96–112)
Creatinine, Ser: 1.1 mg/dL (ref 0.4–1.5)
GFR: 75.87 mL/min (ref >60.00)
Glucose, Bld: 125 mg/dL — ABNORMAL HIGH (ref 70–99)
Potassium: 3 mEq/L — ABNORMAL LOW (ref 3.5–5.1)
Sodium: 141 mEq/L (ref 135–145)
Total Bilirubin: 1.1 mg/dL (ref 0.3–1.2)
Total Protein: 7.4 g/dL (ref 6.0–8.3)

## 2012-07-01 LAB — LIPID PANEL
Cholesterol: 173 mg/dL (ref 0–200)
HDL: 39.1 mg/dL (ref >39.00)
LDL Cholesterol: 96 mg/dL (ref 0–99)
Total CHOL/HDL Ratio: 4
Triglycerides: 190 mg/dL — ABNORMAL HIGH (ref 0.0–149.0)
VLDL: 38 mg/dL (ref 0.0–40.0)

## 2012-07-01 LAB — TSH: TSH: 8.41 u[IU]/mL — ABNORMAL HIGH (ref 0.35–5.50)

## 2012-07-04 ENCOUNTER — Encounter: Payer: 59 | Admitting: Family Medicine

## 2012-07-08 ENCOUNTER — Encounter: Payer: Self-pay | Admitting: Family Medicine

## 2012-07-08 ENCOUNTER — Ambulatory Visit (INDEPENDENT_AMBULATORY_CARE_PROVIDER_SITE_OTHER): Payer: BC Managed Care – PPO | Admitting: Family Medicine

## 2012-07-08 VITALS — BP 142/92 | HR 84 | Temp 98.6°F | Ht 73.5 in | Wt 264.0 lb

## 2012-07-08 DIAGNOSIS — E876 Hypokalemia: Secondary | ICD-10-CM

## 2012-07-08 DIAGNOSIS — K4090 Unilateral inguinal hernia, without obstruction or gangrene, not specified as recurrent: Secondary | ICD-10-CM

## 2012-07-08 DIAGNOSIS — Z0000 Encounter for general adult medical examination without abnormal findings: Secondary | ICD-10-CM

## 2012-07-08 DIAGNOSIS — Z9889 Other specified postprocedural states: Secondary | ICD-10-CM

## 2012-07-08 DIAGNOSIS — E039 Hypothyroidism, unspecified: Secondary | ICD-10-CM

## 2012-07-08 DIAGNOSIS — E785 Hyperlipidemia, unspecified: Secondary | ICD-10-CM

## 2012-07-08 DIAGNOSIS — R739 Hyperglycemia, unspecified: Secondary | ICD-10-CM

## 2012-07-08 DIAGNOSIS — I10 Essential (primary) hypertension: Secondary | ICD-10-CM

## 2012-07-08 DIAGNOSIS — R7309 Other abnormal glucose: Secondary | ICD-10-CM

## 2012-07-08 MED ORDER — LEVOTHYROXINE SODIUM 112 MCG PO TABS
112.0000 ug | ORAL_TABLET | Freq: Every day | ORAL | Status: DC
Start: 2012-07-08 — End: 2013-07-11

## 2012-07-08 MED ORDER — METRONIDAZOLE 1 % EX GEL: 1.0000 "application " | Freq: Every day | CUTANEOUS | Status: DC | PRN

## 2012-07-08 MED ORDER — QUINAPRIL HCL 40 MG PO TABS: 40.0000 mg | ORAL_TABLET | Freq: Every day | ORAL | Status: DC

## 2012-07-08 MED ORDER — AMLODIPINE-ATORVASTATIN 5-10 MG PO TABS: 1.0000 | ORAL_TABLET | Freq: Every day | ORAL | Status: DC

## 2012-07-08 MED ORDER — SPIRONOLACTONE 25 MG PO TABS: 25.0000 mg | ORAL_TABLET | Freq: Every day | ORAL | Status: DC

## 2012-07-08 MED ORDER — POTASSIUM CHLORIDE CRYS ER 20 MEQ PO TBCR: 40.0000 meq | EXTENDED_RELEASE_TABLET | Freq: Two times a day (BID) | ORAL | Status: DC

## 2012-07-08 NOTE — Assessment & Plan Note (Signed)
Continue as is, he'll work on weight, diet, exercise.  Labs d/w pt.  He agrees.  

## 2012-07-08 NOTE — Assessment & Plan Note (Signed)
Soft, reduced, not ttp.  Can follow clinically. If worsening, then to gen surgery for eval.

## 2012-07-08 NOTE — Assessment & Plan Note (Signed)
Persists, recheck K in about 1-2 weeks with inc in replacement.  His mother had similar.

## 2012-07-08 NOTE — Assessment & Plan Note (Signed)
Higher than prev.  He'll work on [employer], diet, exercise.  Labs d/w pt.  Recheck in about 2 months.  He agrees.

## 2012-07-08 NOTE — Patient Instructions (Addendum)
Check with your insurance to see if they will cover the shingles shot after age 59.   Recheck potassium in 1-2 weeks.  Recheck fasting sugar and TSH in 2 months.   Take care. Start back exercising.

## 2012-07-08 NOTE — Assessment & Plan Note (Signed)
Inc replacement and recheck in ~2 months.  He agrees.  Labs d/w pt.

## 2012-07-08 NOTE — Assessment & Plan Note (Signed)
Routine anticipatory guidance given to patient.  See health maintenance. Tetanus 2004 PNA shot up to date.   Shingles shot encouraged at 60.   Flu shot done prev.  Colonoscopy 2011.  PSA d/w pt.  H/o neb biopsy and no FH.  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  He can drop a copy off for me to check.  Declined PSA check today.  Has a living will, wife is designated if incapacitated.

## 2012-07-08 NOTE — Assessment & Plan Note (Signed)
Continue as is, he'll work on weight, diet, exercise.  Labs d/w pt.  He agrees.

## 2012-07-08 NOTE — Assessment & Plan Note (Signed)
PSA deferred by patient. . This is reasonable.

## 2012-07-08 NOTE — Progress Notes (Signed)
CPE- See plan.  Routine anticipatory guidance given to patient.  See health maintenance. Tetanus 2004 PNA shot up to date.   Shingles shot encouraged at 62.   Flu shot done prev.  Colonoscopy 2011.  PSA d/w pt.  H/o neb biopsy and no FH.  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  He can drop a copy off for me to check.   Has a living will, wife is designated if incapacitated.    Hypertension:    Using medication without problems or lightheadedness: yes Chest pain with exertion:no Edema:no Short of breath:no Average home BPs: 130s/80s.   Elevated Cholesterol: Using medications without problems:es Muscle aches: no Diet compliance: "I've gotten away from it."  Discussed.  Exercise: discussed.  Limited recently.    Hyperglycemia on labs.  Had been lower on outside checks.  Diet discussed with patient along with labs.    hypothyroid.  TSH up.  Compliant with meds.  No ant neck pain.    Meds, vitals, and allergies reviewed.   PMH and SH reviewed  ROS: See HPI.  Otherwise negative.    GEN: nad, alert and oriented HEENT: mucous membranes moist NECK: supple w/o LA, no TMG CV: rrr. PULM: ctab, no inc wob ABD: soft, +bs EXT: no edema SKIN: no acute rash Small LIH noted, reduceable.  Not ttp.

## 2012-07-15 ENCOUNTER — Telehealth: Payer: Self-pay | Admitting: Radiology

## 2012-07-15 ENCOUNTER — Other Ambulatory Visit: Payer: Self-pay | Admitting: Family Medicine

## 2012-07-15 ENCOUNTER — Other Ambulatory Visit (INDEPENDENT_AMBULATORY_CARE_PROVIDER_SITE_OTHER): Payer: BC Managed Care – PPO

## 2012-07-15 DIAGNOSIS — E876 Hypokalemia: Secondary | ICD-10-CM

## 2012-07-15 LAB — POTASSIUM: Potassium: 2.8 mEq/L — CL (ref 3.5–5.1)

## 2012-07-15 MED ORDER — SPIRONOLACTONE 25 MG PO TABS: 37.5000 mg | ORAL_TABLET | Freq: Every day | ORAL | Status: DC

## 2012-07-15 NOTE — Telephone Encounter (Signed)
[facility] called a critical K+ 2.8, results given to Dr Para March.

## 2012-07-15 NOTE — Telephone Encounter (Signed)
See following notes.

## 2012-07-22 ENCOUNTER — Other Ambulatory Visit (INDEPENDENT_AMBULATORY_CARE_PROVIDER_SITE_OTHER): Payer: BC Managed Care – PPO

## 2012-07-22 DIAGNOSIS — E876 Hypokalemia: Secondary | ICD-10-CM

## 2012-07-22 LAB — BASIC METABOLIC PANEL
BUN: 16 mg/dL (ref 6–23)
CO2: 28 mEq/L (ref 19–32)
Calcium: 9 mg/dL (ref 8.4–10.5)
Chloride: 104 mEq/L (ref 96–112)
Creatinine, Ser: 1.1 mg/dL (ref 0.4–1.5)
GFR: 71.93 mL/min (ref >60.00)
Glucose, Bld: 109 mg/dL — ABNORMAL HIGH (ref 70–99)
Potassium: 3.1 mEq/L — ABNORMAL LOW (ref 3.5–5.1)
Sodium: 140 mEq/L (ref 135–145)

## 2012-07-23 MED ORDER — SPIRONOLACTONE 25 MG PO TABS: 50.0000 mg | ORAL_TABLET | Freq: Every day | ORAL | Status: DC

## 2012-07-23 NOTE — Addendum Note (Signed)
Addended by: Joaquim Nam on: 07/23/2012 07:48 AM   Modules accepted: Orders

## 2012-08-08 ENCOUNTER — Other Ambulatory Visit (INDEPENDENT_AMBULATORY_CARE_PROVIDER_SITE_OTHER): Payer: BC Managed Care – PPO

## 2012-08-08 DIAGNOSIS — R7309 Other abnormal glucose: Secondary | ICD-10-CM

## 2012-08-08 DIAGNOSIS — E039 Hypothyroidism, unspecified: Secondary | ICD-10-CM

## 2012-08-08 DIAGNOSIS — E876 Hypokalemia: Secondary | ICD-10-CM

## 2012-08-08 DIAGNOSIS — R739 Hyperglycemia, unspecified: Secondary | ICD-10-CM

## 2012-08-08 LAB — BASIC METABOLIC PANEL
BUN: 16 mg/dL (ref 6–23)
CO2: 27 mEq/L (ref 19–32)
Calcium: 8.8 mg/dL (ref 8.4–10.5)
Chloride: 104 mEq/L (ref 96–112)
Creatinine, Ser: 1.2 mg/dL (ref 0.4–1.5)
GFR: 68.35 mL/min (ref >60.00)
Glucose, Bld: 109 mg/dL — ABNORMAL HIGH (ref 70–99)
Potassium: 3.7 mEq/L (ref 3.5–5.1)
Sodium: 139 mEq/L (ref 135–145)

## 2012-08-08 NOTE — Addendum Note (Signed)
Addended by: Liane Comber C on: 08/08/2012 11:28 AM   Modules accepted: Orders

## 2012-08-14 ENCOUNTER — Other Ambulatory Visit: Payer: Self-pay

## 2012-08-14 MED ORDER — SPIRONOLACTONE 25 MG PO TABS: 50.0000 mg | ORAL_TABLET | Freq: Every day | ORAL | Status: DC

## 2012-08-14 NOTE — Telephone Encounter (Signed)
Printed

## 2012-08-14 NOTE — Telephone Encounter (Signed)
Pt request written rx spironolactone. Call when ready for pick up.

## 2012-08-14 NOTE — Telephone Encounter (Signed)
Advised patient script is ready for pick up, placed at front desk. 

## 2012-09-10 ENCOUNTER — Encounter: Payer: Self-pay | Admitting: *Deleted

## 2012-09-10 ENCOUNTER — Other Ambulatory Visit (INDEPENDENT_AMBULATORY_CARE_PROVIDER_SITE_OTHER): Payer: BC Managed Care – PPO

## 2012-09-10 DIAGNOSIS — R739 Hyperglycemia, unspecified: Secondary | ICD-10-CM

## 2012-09-10 DIAGNOSIS — E039 Hypothyroidism, unspecified: Secondary | ICD-10-CM

## 2012-09-10 DIAGNOSIS — R7309 Other abnormal glucose: Secondary | ICD-10-CM

## 2012-09-10 LAB — GLUCOSE, RANDOM: Glucose, Bld: 109 mg/dL — ABNORMAL HIGH (ref 70–99)

## 2012-09-10 LAB — HEMOGLOBIN A1C: Hgb A1c MFr Bld: 6 % (ref 4.6–6.5)

## 2012-09-10 LAB — TSH: TSH: 2.33 u[IU]/mL (ref 0.35–5.50)

## 2013-02-18 ENCOUNTER — Encounter: Payer: Self-pay | Admitting: Family Medicine

## 2013-02-18 ENCOUNTER — Ambulatory Visit (INDEPENDENT_AMBULATORY_CARE_PROVIDER_SITE_OTHER): Payer: BC Managed Care – PPO | Admitting: Family Medicine

## 2013-02-18 VITALS — BP 158/90 | HR 72 | Temp 97.8°F | Wt 272.5 lb

## 2013-02-18 DIAGNOSIS — Z9889 Other specified postprocedural states: Secondary | ICD-10-CM

## 2013-02-18 DIAGNOSIS — R05 Cough: Secondary | ICD-10-CM | POA: Insufficient documentation

## 2013-02-18 DIAGNOSIS — R059 Cough, unspecified: Secondary | ICD-10-CM | POA: Insufficient documentation

## 2013-02-18 DIAGNOSIS — K4090 Unilateral inguinal hernia, without obstruction or gangrene, not specified as recurrent: Secondary | ICD-10-CM

## 2013-02-18 MED ORDER — AMOXICILLIN-POT CLAVULANATE 875-125 MG PO TABS: 1.0000 | ORAL_TABLET | Freq: Two times a day (BID) | ORAL | Status: DC

## 2013-02-18 MED ORDER — HYDROCODONE-HOMATROPINE 5-1.5 MG/5ML PO SYRP: 5.0000 mL | ORAL_SOLUTION | Freq: Three times a day (TID) | ORAL | Status: DC | PRN

## 2013-02-18 NOTE — Progress Notes (Signed)
Cough started 1 week ago.  Noted congestion and drainage initially, clear initially and now yellow.  Coughing still.  Voice is altered.  No fevers.  No sick contacts. No wheeze.  Hasn't tried cough meds other than mucinex.  No ear pain.  Still stuffy.  Using neti pot.  No ST.  Cough is worse episodically, happening in fits.  No fevers, no NAV, no myalgias. He had some upper tooth pain.   Prev with PSA elevation followed by uro.    Known LIH noted on hazmat physical.   Meds, vitals, and allergies reviewed.   ROS: See HPI.  Otherwise, noncontributory.  GEN: nad, alert and oriented HEENT: mucous membranes moist, tm w/o erythema, nasal exam w/o erythema, clear discharge noted,  OP with cobblestoning NECK: supple w/o LA CV: rrr.   PULM: ctab, no inc wob EXT: no edema SKIN: no acute rash

## 2013-02-18 NOTE — Patient Instructions (Addendum)
Hold the antibiotics for now and use the cough medicine.  It can make your drowsy.   Take care.  Try to get some rest.

## 2013-02-18 NOTE — Assessment & Plan Note (Signed)
Elevated PSA 2014, followed by [facility].  Improved (~24-->~10) with cipro course per patient report.  He has f/u with uro pending.

## 2013-02-18 NOTE — Assessment & Plan Note (Signed)
LIH noted on hazmat physical.  We can refer if needed.  He'll call back prn.

## 2013-02-18 NOTE — Assessment & Plan Note (Signed)
Nontoxic, hold abx for now and supportive tx in meantime.  Sedation caution on cough medicine.  Fu prn. ctab today.

## 2013-03-16 ENCOUNTER — Encounter: Payer: Self-pay | Admitting: Family Medicine

## 2013-04-28 ENCOUNTER — Other Ambulatory Visit: Payer: Self-pay | Admitting: Family Medicine

## 2013-07-11 ENCOUNTER — Other Ambulatory Visit: Payer: Self-pay | Admitting: Family Medicine

## 2013-07-31 ENCOUNTER — Other Ambulatory Visit: Payer: Self-pay | Admitting: Family Medicine

## 2013-07-31 DIAGNOSIS — R7309 Other abnormal glucose: Secondary | ICD-10-CM

## 2013-07-31 DIAGNOSIS — E039 Hypothyroidism, unspecified: Secondary | ICD-10-CM

## 2013-07-31 HISTORY — PX: HERNIA REPAIR: SHX51

## 2013-08-07 ENCOUNTER — Other Ambulatory Visit: Payer: BC Managed Care – PPO

## 2013-08-12 ENCOUNTER — Encounter: Payer: BC Managed Care – PPO | Admitting: Family Medicine

## 2013-09-03 ENCOUNTER — Other Ambulatory Visit (INDEPENDENT_AMBULATORY_CARE_PROVIDER_SITE_OTHER): Payer: BC Managed Care – PPO

## 2013-09-03 DIAGNOSIS — E785 Hyperlipidemia, unspecified: Secondary | ICD-10-CM

## 2013-09-03 DIAGNOSIS — I10 Essential (primary) hypertension: Secondary | ICD-10-CM

## 2013-09-03 DIAGNOSIS — E039 Hypothyroidism, unspecified: Secondary | ICD-10-CM

## 2013-09-03 DIAGNOSIS — R7309 Other abnormal glucose: Secondary | ICD-10-CM

## 2013-09-03 LAB — COMPREHENSIVE METABOLIC PANEL
ALT: 30 U/L (ref 0–53)
AST: 23 U/L (ref 0–37)
Albumin: 4.2 g/dL (ref 3.5–5.2)
Alkaline Phosphatase: 58 U/L (ref 39–117)
BUN: 14 mg/dL (ref 6–23)
CO2: 28 mEq/L (ref 19–32)
Calcium: 9 mg/dL (ref 8.4–10.5)
Chloride: 103 mEq/L (ref 96–112)
Creatinine, Ser: 1.1 mg/dL (ref 0.4–1.5)
GFR: 73.18 mL/min (ref >60.00)
Glucose, Bld: 107 mg/dL — ABNORMAL HIGH (ref 70–99)
Potassium: 4 mEq/L (ref 3.5–5.1)
Sodium: 139 mEq/L (ref 135–145)
Total Bilirubin: 1 mg/dL (ref 0.3–1.2)
Total Protein: 7.2 g/dL (ref 6.0–8.3)

## 2013-09-03 LAB — LIPID PANEL
Cholesterol: 153 mg/dL (ref 0–200)
HDL: 43.7 mg/dL (ref >39.00)
LDL Cholesterol: 86 mg/dL (ref 0–99)
Total CHOL/HDL Ratio: 4
Triglycerides: 118 mg/dL (ref 0.0–149.0)
VLDL: 23.6 mg/dL (ref 0.0–40.0)

## 2013-09-03 LAB — TSH: TSH: 2.37 u[IU]/mL (ref 0.35–5.50)

## 2013-09-03 LAB — HEMOGLOBIN A1C: Hgb A1c MFr Bld: 5.8 % (ref 4.6–6.5)

## 2013-09-10 ENCOUNTER — Ambulatory Visit (INDEPENDENT_AMBULATORY_CARE_PROVIDER_SITE_OTHER): Payer: BC Managed Care – PPO | Admitting: Family Medicine

## 2013-09-10 ENCOUNTER — Encounter: Payer: Self-pay | Admitting: Family Medicine

## 2013-09-10 VITALS — BP 148/88 | HR 96 | Temp 98.4°F | Ht 73.0 in | Wt 252.5 lb

## 2013-09-10 DIAGNOSIS — Z23 Encounter for immunization: Secondary | ICD-10-CM

## 2013-09-10 DIAGNOSIS — K4090 Unilateral inguinal hernia, without obstruction or gangrene, not specified as recurrent: Secondary | ICD-10-CM

## 2013-09-10 DIAGNOSIS — I10 Essential (primary) hypertension: Secondary | ICD-10-CM

## 2013-09-10 DIAGNOSIS — E039 Hypothyroidism, unspecified: Secondary | ICD-10-CM

## 2013-09-10 DIAGNOSIS — Z0000 Encounter for general adult medical examination without abnormal findings: Secondary | ICD-10-CM

## 2013-09-10 DIAGNOSIS — E785 Hyperlipidemia, unspecified: Secondary | ICD-10-CM

## 2013-09-10 MED ORDER — SPIRONOLACTONE 25 MG PO TABS: ORAL_TABLET | ORAL | Status: DC

## 2013-09-10 MED ORDER — LEVOTHYROXINE SODIUM 112 MCG PO TABS: ORAL_TABLET | ORAL | Status: DC

## 2013-09-10 MED ORDER — METRONIDAZOLE 1 % EX GEL: 1.0000 "application " | Freq: Every day | CUTANEOUS | Status: DC | PRN

## 2013-09-10 MED ORDER — POTASSIUM CHLORIDE CRYS ER 20 MEQ PO TBCR: EXTENDED_RELEASE_TABLET | ORAL | Status: DC

## 2013-09-10 MED ORDER — AMLODIPINE-ATORVASTATIN 5-10 MG PO TABS: ORAL_TABLET | ORAL | Status: DC

## 2013-09-10 MED ORDER — QUINAPRIL HCL 40 MG PO TABS: ORAL_TABLET | ORAL | Status: DC

## 2013-09-10 NOTE — Patient Instructions (Addendum)
Check with your insurance to see if they will cover the shingles shot. Carlos Foster will call about your referral. Don't change your meds.   Keep working on your weight.  Glad to see you.

## 2013-09-10 NOTE — Assessment & Plan Note (Signed)
Routine anticipatory guidance given to patient.  See health maintenance. Tetanus 2015 Flu shot already done.   PNA shot due at 67.  Shingles shot d/w pt.   Colonoscopy 2011 PSA per urology/will be checked with work physical later in the year.  Living will d/w pt.  Wife designated if incapacitated.   Diet and exercise d/w pt.  Down 20 lbs, intentionally.  I thanked him for his effort.

## 2013-09-10 NOTE — Assessment & Plan Note (Signed)
Refer, routine cautions given.

## 2013-09-10 NOTE — Assessment & Plan Note (Signed)
Usually controlled, continue as is.

## 2013-09-10 NOTE — Assessment & Plan Note (Signed)
Improved with weight loss.  Continue as is. 

## 2013-09-10 NOTE — Assessment & Plan Note (Signed)
No tmg, tsh okay, dw pt.

## 2013-09-10 NOTE — Progress Notes (Signed)
Pre-visit discussion using our clinic review tool. No additional management support is needed unless otherwise documented below in the visit note.  CPE- See plan.  Routine anticipatory guidance given to patient.  See health maintenance. Tetanus 2015 Flu shot already done.   PNA shot due at 67.  Shingles shot d/w pt.   Colonoscopy 2011 PSA per urology/will be checked with work physical later in the year.  Living will d/w pt.  Wife designated if incapacitated.   Diet and exercise d/w pt.  Down 20 lbs, intentionally.  I thanked him for his effort.   Hypertension:    Using medication without problems or lightheadedness: yes Chest pain with exertion:no Edema:no Short of breath:no BP lower at home, checks out of office.    Elevated Cholesterol: Using medications without problems:yes Muscle aches: no Diet compliance:yes Exercise:yes  Hypothyroid.  TSH wnl. No neck mass.  No dysphagia.  Compliant with meds.    Likely BIH.  Can get sore.  Noted a bulge he can push back in.    PMH and SH reviewed  Meds, vitals, and allergies reviewed.   ROS: See HPI.  Otherwise negative.    GEN: nad, alert and oriented HEENT: mucous membranes moist NECK: supple w/o LA CV: rrr. PULM: ctab, no inc wob ABD: soft, +bs, L>R IH noted.  EXT: no edema SKIN: no acute rash

## 2013-09-11 ENCOUNTER — Telehealth: Payer: Self-pay | Admitting: Family Medicine

## 2013-09-11 NOTE — Telephone Encounter (Signed)
Relevant patient education assigned to patient using Emmi. ° °

## 2013-09-23 ENCOUNTER — Encounter (INDEPENDENT_AMBULATORY_CARE_PROVIDER_SITE_OTHER): Payer: Self-pay | Admitting: General Surgery

## 2013-09-23 ENCOUNTER — Ambulatory Visit (INDEPENDENT_AMBULATORY_CARE_PROVIDER_SITE_OTHER): Payer: BC Managed Care – PPO | Admitting: General Surgery

## 2013-09-23 VITALS — BP 168/100 | HR 72 | Resp 16 | Ht 73.0 in | Wt 258.6 lb

## 2013-09-23 DIAGNOSIS — K4020 Bilateral inguinal hernia, without obstruction or gangrene, not specified as recurrent: Secondary | ICD-10-CM | POA: Insufficient documentation

## 2013-09-23 NOTE — Patient Instructions (Signed)
You have a large left inguinal hernia and a smaller  rightinguinal hernia.  We have talked about the natural history of hernias. We have talked about different techniques for repair, and the advantages and disadvantages of each.  You will be scheduled for open repair of bilateral inguinal hernias with mesh at your convenience.    Inguinal Hernia, Adult Muscles help keep everything in the body in its proper place. But if a weak spot in the muscles develops, something can poke through. That is called a hernia. When this happens in the lower part of the belly (abdomen), it is called an inguinal hernia. (It takes its name from a part of the body in this region called the inguinal canal.) A weak spot in the wall of muscles lets some fat or part of the small intestine bulge through. An inguinal hernia can develop at any age. Men get them more often than women. CAUSES  In adults, an inguinal hernia develops over time.  It can be triggered by:  Suddenly straining the muscles of the lower abdomen.  Lifting heavy objects.  Straining to have a bowel movement. Difficult bowel movements (constipation) can lead to this.  Constant coughing. This may be caused by smoking or lung disease.  Being overweight.  Being pregnant.  Working at a job that requires long periods of standing or heavy lifting.  Having had an inguinal hernia before. One type can be an emergency situation. It is called a strangulated inguinal hernia. It develops if part of the small intestine slips through the weak spot and cannot get back into the abdomen. The blood supply can be cut off. If that happens, part of the intestine may die. This situation requires emergency surgery. SYMPTOMS  Often, a small inguinal hernia has no symptoms. It is found when a healthcare provider does a physical exam. Larger hernias usually have symptoms.   In adults, symptoms may include:  A lump in the groin. This is easier to see when the person is  standing. It might disappear when lying down.  In men, a lump in the scrotum.  Pain or burning in the groin. This occurs especially when lifting, straining or coughing.  A dull ache or feeling of pressure in the groin.  Signs of a strangulated hernia can include:  A bulge in the groin that becomes very painful and tender to the touch.  A bulge that turns red or purple.  Fever, nausea and vomiting.  Inability to have a bowel movement or to pass gas. DIAGNOSIS  To decide if you have an inguinal hernia, a healthcare provider will probably do a physical examination.  This will include asking questions about any symptoms you have noticed.  The healthcare provider might feel the groin area and ask you to cough. If an inguinal hernia is felt, the healthcare provider may try to slide it back into the abdomen.  Usually no other tests are needed. TREATMENT  Treatments can vary. The size of the hernia makes a difference. Options include:  Watchful waiting. This is often suggested if the hernia is small and you have had no symptoms.  No medical procedure will be done unless symptoms develop.  You will need to watch closely for symptoms. If any occur, contact your healthcare provider right away.  Surgery. This is used if the hernia is larger or you have symptoms.  Open surgery. This is usually an outpatient procedure (you will not stay overnight in a hospital). An cut (incision) is made  through the skin in the groin. The hernia is put back inside the abdomen. The weak area in the muscles is then repaired by herniorrhaphy or hernioplasty. Herniorrhaphy: in this type of surgery, the weak muscles are sewn back together. Hernioplasty: a patch or mesh is used to close the weak area in the abdominal wall.  Laparoscopy. In this procedure, a surgeon makes small incisions. A thin tube with a tiny video camera (called a laparoscope) is put into the abdomen. The surgeon repairs the hernia with mesh by  looking with the video camera and using two long instruments. HOME CARE INSTRUCTIONS   After surgery to repair an inguinal hernia:  You will need to take pain medicine prescribed by your healthcare provider. Follow all directions carefully.  You will need to take care of the wound from the incision.  Your activity will be restricted for awhile. This will probably include no heavy lifting for several weeks. You also should not do anything too active for a few weeks. When you can return to work will depend on the type of job that you have.  During "watchful waiting" periods, you should:  Maintain a healthy weight.  Eat a diet high in fiber (fruits, vegetables and whole grains).  Drink plenty of fluids to avoid constipation. This means drinking enough water and other liquids to keep your urine clear or pale yellow.  Do not lift heavy objects.  Do not stand for long periods of time.  Quit smoking. This should keep you from developing a frequent cough. SEEK MEDICAL CARE IF:   A bulge develops in your groin area.  You feel pain, a burning sensation or pressure in the groin. This might be worse if you are lifting or straining.  You develop a fever of more than 100.5 F (38.1 C). SEEK IMMEDIATE MEDICAL CARE IF:   Pain in the groin increases suddenly.  A bulge in the groin gets bigger suddenly and does not go down.  For men, there is sudden pain in the scrotum. Or, the size of the scrotum increases.  A bulge in the groin area becomes red or purple and is painful to touch.  You have nausea or vomiting that does not go away.  You feel your heart beating much faster than normal.  You cannot have a bowel movement or pass gas.  You develop a fever of more than 102.0 F (38.9 C). Document Released: 12/03/2008 Document Revised: 10/09/2011 Document Reviewed: 12/03/2008 [facility] Patient Information 2014 [place], [place].

## 2013-09-23 NOTE — Progress Notes (Signed)
Patient ID: Carlos Foster, male   DOB: [birth year], 63 y.o.   MRN: [id number]  Chief Complaint  Patient presents with  . hernia\    HPI Carlos Foster is a 63 y.o. male.  He is referred by Dr. Mechele Dawley for valuation and management of bilateral inguinal hernias.  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  He says a physician noted a left inguinal hernia several years ago but it wasn't symptomatic. The left side has become symptomatic for the past 6 months has become bigger, somewhat painful and he said that he forcibly pushed it back in. Recent exam revealed a smaller right inguinal hernia as well. He says he was to get these repaired in April. No prior history of hernia or hernia surgery  Comorbidities include hypertension on 3 medications, hyperlipidemia, hypothyroidism on Synthroid. Elevated PSA which fortunately turned out to be prostatitis, followed by Irine Seal.  HPI  Past Medical History  Diagnosis Date  . Hypertension   . Thyroid disease     Hypothyroidism  . Hyperlipidemia   . Hypokalemia   . Rosacea     Past Surgical History  Procedure Laterality Date  . Morton's neuroma repair  1982  . Hemorrhoid surgery  1982  . Laminectomy  12/02/99    C4/5, 5/6, 6/7 Fusion (Dr. Joya Salm)  . Ett  04/15/03    wnl ST decreased < 52mm V6  . Prostate biopsy  12/10/06    Benign Jeffie Pollock)    Family History  Problem Relation Age of Onset  . Kidney disease Mother     Acute kidney failure  . Heart disease Mother     CAD  . Hypertension Mother   . Diabetes Mother     DM and diabetic retinopathy, toe amputation  . Lymphoma Maternal Uncle     X 2  . Depression Neg Hx   . Alcohol abuse Neg Hx   . Drug abuse Neg Hx   . Stroke Neg Hx   . Prostate cancer Neg Hx   . Colon cancer Neg Hx   . Diabetes Other   . Heart disease Father   . AAA (abdominal aortic aneurysm) Father     Social History History  Substance Use Topics  . Smoking status: Never Smoker   . Smokeless  tobacco: Never Used  . Alcohol Use: Yes     Comment: Very rare    No Known Allergies  Current Outpatient Prescriptions  Medication Sig Dispense Refill  . amLODipine-atorvastatin (CADUET) 5-10 MG per tablet Take 1 tablet by mouth  daily  90 tablet  3  . levothyroxine (SYNTHROID, LEVOTHROID) 112 MCG tablet Take 1 tablet by mouth  daily except for 1 and 1/2  tablets by mouth on Sunday  100 tablet  3  . metroNIDAZOLE (METROGEL) 1 % gel Apply 1 application topically daily as needed.  60 g  12  . Multiple Vitamin (MULTIVITAMIN) tablet Take 1 tablet by mouth daily.        . potassium chloride SA (K-DUR,KLOR-CON) 20 MEQ tablet Take 2 tablets by mouth two times daily  360 tablet  3  . quinapril (ACCUPRIL) 40 MG tablet Take 1 tablet by mouth at bedtime  90 tablet  0  . spironolactone (ALDACTONE) 25 MG tablet Take 2 tablets by mouth  daily  180 tablet  3   No current facility-administered medications for this visit.    Review of Systems Review of Systems  Constitutional: Negative for fever, chills and  unexpected weight change.  HENT: Negative for congestion, hearing loss, sore throat, trouble swallowing and voice change.   Eyes: Negative for visual disturbance.  Respiratory: Negative for cough and wheezing.   Cardiovascular: Negative for chest pain, palpitations and leg swelling.  Gastrointestinal: Negative for nausea, vomiting, abdominal pain, diarrhea, constipation, blood in stool, abdominal distention, anal bleeding and rectal pain.  Genitourinary: Negative for hematuria and difficulty urinating.  Musculoskeletal: Negative for arthralgias.  Skin: Negative for rash and wound.  Neurological: Negative for seizures, syncope, weakness and headaches.  Hematological: Negative for adenopathy. Does not bruise/bleed easily.  Psychiatric/Behavioral: Negative for confusion.    Blood pressure 168/100, pulse 72, resp. rate 16, height 6\' 1"  (1.854 m), weight 258 lb 9.6 oz (117.3 kg).  Physical  Exam Physical Exam  Constitutional: He is oriented to person, place, and time. He appears well-developed and well-nourished. No distress.  Pleasant. Large, tall , muscular man.  HENT:  Head: Normocephalic.  Nose: Nose normal.  Mouth/Throat: No oropharyngeal exudate.  Eyes: Conjunctivae and EOM are normal. Pupils are equal, round, and reactive to light. Right eye exhibits no discharge. Left eye exhibits no discharge. No scleral icterus.  Neck: Normal range of motion. Neck supple. No JVD present. No tracheal deviation present. No thyromegaly present.  Cardiovascular: Normal rate, regular rhythm, normal heart sounds and intact distal pulses.   No murmur heard. Pulmonary/Chest: Effort normal and breath sounds normal. No stridor. No respiratory distress. He has no wheezes. He has no rales. He exhibits no tenderness.  Abdominal: Soft. Bowel sounds are normal. He exhibits no distension and no mass. There is no tenderness. There is no rebound and no guarding.  Genitourinary:  Large left inguinal hernia, does not extend into the scrotum. Smaller but definite right inguinal hernia. No testicular or scrotal mass noted. Umbilicus normal.  Musculoskeletal: Normal range of motion. He exhibits no edema and no tenderness.  Lymphadenopathy:    He has no cervical adenopathy.  Neurological: He is alert and oriented to person, place, and time. He has normal reflexes. Coordination normal.  Skin: Skin is warm and dry. No rash noted. He is not diaphoretic. No erythema. No pallor.  Psychiatric: He has a normal mood and affect. His behavior is normal. Judgment and thought content normal.    Data Reviewed Dr. Josefine Class office notes.  Assessment    Bilateral inguinal hernias, large old left and medium-sized on the right.  Hypertension on 3 medications  Hyperlipidemia  Benign elevation of PSA  Hypothyroidism on HRT     Plan    We talked about the difference in open repair and laparoscopic repair. We  talked about the advantages and disadvantages of each. The hernia on the left side is gotten large enough to where I think the recurrence rates with laparoscopic approach would be significantly greater than open repair and I have advised open repair. He agrees with this  He'll be scheduled for open repair of bilateral inguinal hernias with mesh in April which is his preference  I discussed the indications, details, techniques, risks, and temporary disability issues.He is aware of the risk of bleeding, infection, nerve damage with chronic pain, recurrence of the hernia, injury to adjacent organs such that the testicle, bladder or intestine, and other unforeseen problems. He understands all these issues all his questions were answered. He agrees with this plan.  [facility] but hopefully this can be done as an outpatient.        Edsel Petrin.  Dalbert Batman, M.D., [facility] General and Minimally invasive Surgery Breast and Colorectal Surgery Office:   [phone] Pager:   [phone]   09/23/2013, 9:18 AM

## 2013-10-16 ENCOUNTER — Encounter: Payer: BC Managed Care – PPO | Admitting: Family Medicine

## 2013-11-21 ENCOUNTER — Other Ambulatory Visit (INDEPENDENT_AMBULATORY_CARE_PROVIDER_SITE_OTHER): Payer: Self-pay

## 2013-11-21 DIAGNOSIS — K4020 Bilateral inguinal hernia, without obstruction or gangrene, not specified as recurrent: Secondary | ICD-10-CM

## 2013-11-21 MED ORDER — OXYCODONE-ACETAMINOPHEN 7.5-325 MG PO TABS: 1.0000 | ORAL_TABLET | ORAL | Status: DC | PRN

## 2013-11-28 ENCOUNTER — Other Ambulatory Visit: Payer: Self-pay | Admitting: Family Medicine

## 2013-12-10 ENCOUNTER — Encounter (INDEPENDENT_AMBULATORY_CARE_PROVIDER_SITE_OTHER): Payer: Self-pay

## 2013-12-10 ENCOUNTER — Ambulatory Visit (INDEPENDENT_AMBULATORY_CARE_PROVIDER_SITE_OTHER): Payer: BC Managed Care – PPO | Admitting: General Surgery

## 2013-12-10 ENCOUNTER — Encounter (INDEPENDENT_AMBULATORY_CARE_PROVIDER_SITE_OTHER): Payer: Self-pay | Admitting: General Surgery

## 2013-12-10 VITALS — BP 126/76 | HR 90 | Temp 97.8°F | Ht 73.0 in | Wt 258.0 lb

## 2013-12-10 DIAGNOSIS — K4020 Bilateral inguinal hernia, without obstruction or gangrene, not specified as recurrent: Secondary | ICD-10-CM

## 2013-12-10 NOTE — Patient Instructions (Signed)
You are recovering from your bilateral inguinal hernia repair surgery without any obvious complications.  You may work at this time, but you are restricted to light-duty until after May 24. After May 24 you may resume all normal activities without restriction.  Do lots of walking and lots of stretching between now and then.  Return to see Dr. Dalbert Batman if necessary.

## 2013-12-10 NOTE — Progress Notes (Signed)
Patient ID: Carlos Foster, male   DOB: 1950/08/20, 63 y.o.   MRN: [id number] History: This gentleman underwent open repair of bilateral inguinal hernias with mesh on 11/21/2013. He has done very well. He is back to work on light duty as the [employer] No complaints about his wound. Minimal swelling. Almost no pain. Walking daily.  Exam: Patient looks well. No distress Abdomen soft and nontender Bilateral groin incisions are soft and healing well without any hematoma. The hernia repairs are intact. Penis scrotum and testes normal  Assessment: Bilateral inguinal hernias, recovering uneventfully following open repair with mesh  Plan: Activities discussed Resume normal physical activities without restriction after May 24 Return to see me as needed.    Edsel Petrin. Dalbert Batman, M.D., [facility] General and Minimally invasive Surgery Breast and Colorectal Surgery Office:   [phone] Pager:   [phone]

## 2014-04-29 ENCOUNTER — Other Ambulatory Visit: Payer: Self-pay | Admitting: Family Medicine

## 2014-08-30 ENCOUNTER — Other Ambulatory Visit: Payer: Self-pay | Admitting: Family Medicine

## 2014-08-30 DIAGNOSIS — R739 Hyperglycemia, unspecified: Secondary | ICD-10-CM

## 2014-08-31 ENCOUNTER — Other Ambulatory Visit (INDEPENDENT_AMBULATORY_CARE_PROVIDER_SITE_OTHER): Payer: BLUE CROSS/BLUE SHIELD

## 2014-08-31 DIAGNOSIS — R739 Hyperglycemia, unspecified: Secondary | ICD-10-CM

## 2014-08-31 LAB — COMPREHENSIVE METABOLIC PANEL
ALT: 29 U/L (ref 0–53)
AST: 23 U/L (ref 0–37)
Albumin: 4.4 g/dL (ref 3.5–5.2)
Alkaline Phosphatase: 59 U/L (ref 39–117)
BUN: 22 mg/dL (ref 6–23)
CO2: 27 mEq/L (ref 19–32)
Calcium: 9.3 mg/dL (ref 8.4–10.5)
Chloride: 105 mEq/L (ref 96–112)
Creatinine, Ser: 1.18 mg/dL (ref 0.40–1.50)
GFR: 66.55 mL/min (ref >60.00)
Glucose, Bld: 118 mg/dL — ABNORMAL HIGH (ref 70–99)
Potassium: 4.2 mEq/L (ref 3.5–5.1)
Sodium: 139 mEq/L (ref 135–145)
Total Bilirubin: 0.7 mg/dL (ref 0.2–1.2)
Total Protein: 7.1 g/dL (ref 6.0–8.3)

## 2014-08-31 LAB — HEMOGLOBIN A1C: Hgb A1c MFr Bld: 6.2 % (ref 4.6–6.5)

## 2014-08-31 LAB — LIPID PANEL
Cholesterol: 160 mg/dL (ref 0–200)
HDL: 45.6 mg/dL (ref >39.00)
LDL Cholesterol: 91 mg/dL (ref 0–99)
NonHDL: 114.4
Total CHOL/HDL Ratio: 4
Triglycerides: 117 mg/dL (ref 0.0–149.0)
VLDL: 23.4 mg/dL (ref 0.0–40.0)

## 2014-08-31 LAB — TSH: TSH: 3.79 u[IU]/mL (ref 0.35–4.50)

## 2014-09-03 ENCOUNTER — Other Ambulatory Visit: Payer: BC Managed Care – PPO

## 2014-09-10 ENCOUNTER — Ambulatory Visit (INDEPENDENT_AMBULATORY_CARE_PROVIDER_SITE_OTHER): Payer: BLUE CROSS/BLUE SHIELD | Admitting: Family Medicine

## 2014-09-10 ENCOUNTER — Encounter: Payer: Self-pay | Admitting: Family Medicine

## 2014-09-10 VITALS — BP 142/84 | HR 99 | Temp 98.5°F | Ht 73.0 in | Wt 276.0 lb

## 2014-09-10 DIAGNOSIS — E038 Other specified hypothyroidism: Secondary | ICD-10-CM

## 2014-09-10 DIAGNOSIS — E785 Hyperlipidemia, unspecified: Secondary | ICD-10-CM

## 2014-09-10 DIAGNOSIS — R739 Hyperglycemia, unspecified: Secondary | ICD-10-CM

## 2014-09-10 DIAGNOSIS — M79651 Pain in right thigh: Secondary | ICD-10-CM

## 2014-09-10 DIAGNOSIS — Z7189 Other specified counseling: Secondary | ICD-10-CM

## 2014-09-10 DIAGNOSIS — Z0000 Encounter for general adult medical examination without abnormal findings: Secondary | ICD-10-CM

## 2014-09-10 DIAGNOSIS — I10 Essential (primary) hypertension: Secondary | ICD-10-CM

## 2014-09-10 MED ORDER — AMLODIPINE-ATORVASTATIN 5-10 MG PO TABS: ORAL_TABLET | ORAL | Status: DC

## 2014-09-10 MED ORDER — QUINAPRIL HCL 40 MG PO TABS: ORAL_TABLET | ORAL | Status: DC

## 2014-09-10 MED ORDER — SPIRONOLACTONE 25 MG PO TABS: ORAL_TABLET | ORAL | Status: DC

## 2014-09-10 MED ORDER — POTASSIUM CHLORIDE CRYS ER 20 MEQ PO TBCR: EXTENDED_RELEASE_TABLET | ORAL | Status: DC

## 2014-09-10 MED ORDER — METRONIDAZOLE 1 % EX GEL: 1.0000 "application " | Freq: Every day | CUTANEOUS | Status: DC | PRN

## 2014-09-10 MED ORDER — LEVOTHYROXINE SODIUM 112 MCG PO TABS: ORAL_TABLET | ORAL | Status: DC

## 2014-09-10 NOTE — Patient Instructions (Addendum)
Check with your insurance to see if they will cover the shingles shot. Get back in the gym and stretch your legs gently.  If you keep having trouble the let me know.  Take care.  Glad to see you.

## 2014-09-10 NOTE — Progress Notes (Signed)
Pre visit review using our clinic review tool, if applicable. No additional management support is needed unless otherwise documented below in the visit note.  CPE- See plan.  Routine anticipatory guidance given to patient.  See health maintenance. Flu 03/2014 Tetanus 2015 PNA at 67 Shingles d/w pt.  Colonoscopy 2011.  Prev with PSA elevation, it has resolved after abx treatment, he had f/u with urology.  He has plans for a f/u PSA work physical later this year.   Living will d/w pt.  Wife designated if patient were incapacitated.   Diet and exercise are "sorry."  He has lost some weight but then regained.  He has plans to get back in the gym.  Some insomnia that is probably work related, better on the nights when he isn't going in the next day.   Hypertension:    Using medication without problems or lightheadedness: yes Chest pain with exertion:no Edema:no Short of breath:no  Elevated Cholesterol: Using medications without problems:yes Muscle aches: no Diet compliance:see above Exercise: see above  Some R thigh pain.  More with certain movements initially, it crossing his leg, overall does better when up and walking.  No trauma.  No injury.  No L sided pain.  No groin pain.   PMH and SH reviewed  Meds, vitals, and allergies reviewed.   ROS: See HPI.  Otherwise negative.    GEN: nad, alert and oriented HEENT: mucous membranes moist NECK: supple w/o LA CV: rrr. PULM: ctab, no inc wob ABD: soft, +bs EXT: no edema SKIN: no acute rash, SKs noted- benign appearing.  R thigh pain with crossing R leg over but no groin radiation.  Not ttp on the greater troch.

## 2014-09-11 DIAGNOSIS — M5430 Sciatica, unspecified side: Secondary | ICD-10-CM | POA: Insufficient documentation

## 2014-09-11 DIAGNOSIS — M79659 Pain in unspecified thigh: Secondary | ICD-10-CM | POA: Insufficient documentation

## 2014-09-11 DIAGNOSIS — Z7189 Other specified counseling: Secondary | ICD-10-CM | POA: Insufficient documentation

## 2014-09-11 NOTE — Assessment & Plan Note (Signed)
TSH wnl, continue as is. No TMG on exam.

## 2014-09-11 NOTE — Assessment & Plan Note (Signed)
Routine anticipatory guidance given to patient.  See health maintenance. Flu 03/2014 Tetanus 2015 PNA at 67 Shingles d/w pt.  Colonoscopy 2011.  Prev with PSA elevation, it has resolved after abx treatment, he had f/u with urology.  He has plans for a f/u PSA work physical later this year.   Living will d/w pt.  Wife designated if patient were incapacitated.   Diet and exercise are "sorry."  He has lost some weight but then regained.  He has plans to get back in the gym.  Some insomnia that is probably work related, better on the nights when he isn't going in the next day.

## 2014-09-11 NOTE — Assessment & Plan Note (Signed)
D/w pt.  He'll work on diet and exercise.

## 2014-09-11 NOTE — Assessment & Plan Note (Signed)
Anterior, not at the ITB or greater troch.  Likely would benefit from stretching and he'll work on that, f/u prn.

## 2014-09-11 NOTE — Assessment & Plan Note (Signed)
Reasonably controlled, work situation is likely contributing.  He'll work on diet and exercise and notify me prn.  Labs okay for now, d/w pt.  He agrees.

## 2014-09-11 NOTE — Assessment & Plan Note (Signed)
Controlled.  He'll work on diet and exercise and notify me prn.  Labs okay for now, d/w pt.  He agrees.

## 2014-12-06 ENCOUNTER — Other Ambulatory Visit: Payer: Self-pay | Admitting: Family Medicine

## 2014-12-07 NOTE — Telephone Encounter (Signed)
Received refill request electronically from pharmacy. Last refill 09/10/14 #60G/1 refill, last office visit 09/10/14. Is it okay to refill medication?

## 2014-12-08 NOTE — Telephone Encounter (Signed)
Sent. Thanks.   

## 2015-02-25 ENCOUNTER — Ambulatory Visit (INDEPENDENT_AMBULATORY_CARE_PROVIDER_SITE_OTHER): Payer: BLUE CROSS/BLUE SHIELD | Admitting: Family Medicine

## 2015-02-25 ENCOUNTER — Encounter: Payer: Self-pay | Admitting: Family Medicine

## 2015-02-25 VITALS — BP 124/76 | HR 87 | Temp 98.7°F | Wt 280.2 lb

## 2015-02-25 DIAGNOSIS — N62 Hypertrophy of breast: Secondary | ICD-10-CM | POA: Diagnosis not present

## 2015-02-25 NOTE — Progress Notes (Signed)
Pre visit review using our clinic review tool, if applicable. No additional management support is needed unless otherwise documented below in the visit note.  Changes noted deep under R nipple.  Present for about 3 months.  Sore to the touch.  Variable in size.  No lesion on the L side.  No FCNAVD.   No double vision.  No nipple discharge.  No trauma. Is on spironolactone.  No h/o similar lesion prev.  No LA in the axilla note by patient.  No other new skin lesions.   PMH and SH reviewed  ROS: See HPI, otherwise noncontributory.  Meds, vitals, and allergies reviewed.   nad R nipple more prominent than the L.  No discharge.  No erythema.  Is ttp.  No other masses in the R breast.  No lesions in L breast, L nipple not ttp.  No R axillary lesions felt.

## 2015-02-25 NOTE — Patient Instructions (Signed)
Early AM labs.  You don't have to fast.  Rosaria Ferries will call about your referral. Take care.  Glad to see you.  Likely benign gynecomastia due to spironolactone use.

## 2015-02-26 ENCOUNTER — Encounter: Payer: Self-pay | Admitting: Family Medicine

## 2015-02-26 ENCOUNTER — Other Ambulatory Visit (INDEPENDENT_AMBULATORY_CARE_PROVIDER_SITE_OTHER): Payer: BLUE CROSS/BLUE SHIELD

## 2015-02-26 DIAGNOSIS — N62 Hypertrophy of breast: Secondary | ICD-10-CM

## 2015-02-26 LAB — TESTOSTERONE: Testosterone: 379.27 ng/dL (ref 300.00–890.00)

## 2015-02-26 LAB — LUTEINIZING HORMONE: LH: 7.68 m[IU]/mL (ref 1.50–9.30)

## 2015-02-26 LAB — HCG, QUANTITATIVE, PREGNANCY: Quantitative HCG: 0.23 m[IU]/mL

## 2015-02-26 NOTE — Assessment & Plan Note (Signed)
New problem. Likely from spironolactone.  D/w pt.  That BP med had helped a lot and it would preferred for him not to have to change meds.   Will check basic labs and mammogram, u/s.  I think this is still all a benign med side effect and we'll make plans when I have his labs resulted.  He agreed.

## 2015-03-02 LAB — ESTRADIOL: Estradiol: 55.1 pg/mL — ABNORMAL HIGH

## 2015-03-03 ENCOUNTER — Ambulatory Visit
Admission: RE | Admit: 2015-03-03 | Discharge: 2015-03-03 | Disposition: A | Discharge status: Home / Self Care | Payer: BLUE CROSS/BLUE SHIELD | Source: Ambulatory Visit | Attending: Family Medicine | Admitting: Family Medicine

## 2015-03-03 DIAGNOSIS — N62 Hypertrophy of breast: Secondary | ICD-10-CM

## 2015-03-05 ENCOUNTER — Other Ambulatory Visit: Payer: Self-pay | Admitting: Family Medicine

## 2015-08-04 ENCOUNTER — Other Ambulatory Visit: Payer: Self-pay | Admitting: Family Medicine

## 2015-08-04 NOTE — Telephone Encounter (Signed)
Received refill request electronically  See drug warning with Spironolactone Is it okay to refill?

## 2015-08-04 NOTE — Telephone Encounter (Signed)
Okay to continue, hx of low K.  Thanks.  Sent.

## 2015-09-05 ENCOUNTER — Other Ambulatory Visit: Payer: Self-pay | Admitting: Family Medicine

## 2015-09-05 DIAGNOSIS — I10 Essential (primary) hypertension: Secondary | ICD-10-CM

## 2015-09-05 DIAGNOSIS — R739 Hyperglycemia, unspecified: Secondary | ICD-10-CM

## 2015-09-13 ENCOUNTER — Other Ambulatory Visit (INDEPENDENT_AMBULATORY_CARE_PROVIDER_SITE_OTHER): Payer: BLUE CROSS/BLUE SHIELD

## 2015-09-13 DIAGNOSIS — I10 Essential (primary) hypertension: Secondary | ICD-10-CM | POA: Diagnosis not present

## 2015-09-13 DIAGNOSIS — R739 Hyperglycemia, unspecified: Secondary | ICD-10-CM

## 2015-09-13 LAB — LIPID PANEL
Cholesterol: 170 mg/dL (ref 0–200)
HDL: 39.9 mg/dL (ref >39.00)
LDL Cholesterol: 98 mg/dL (ref 0–99)
NonHDL: 129.89
Total CHOL/HDL Ratio: 4
Triglycerides: 157 mg/dL — ABNORMAL HIGH (ref 0.0–149.0)
VLDL: 31.4 mg/dL (ref 0.0–40.0)

## 2015-09-13 LAB — COMPREHENSIVE METABOLIC PANEL
ALT: 43 U/L (ref 0–53)
AST: 33 U/L (ref 0–37)
Albumin: 4.5 g/dL (ref 3.5–5.2)
Alkaline Phosphatase: 68 U/L (ref 39–117)
BUN: 10 mg/dL (ref 6–23)
CO2: 30 mEq/L (ref 19–32)
Calcium: 9.3 mg/dL (ref 8.4–10.5)
Chloride: 102 mEq/L (ref 96–112)
Creatinine, Ser: 1.05 mg/dL (ref 0.40–1.50)
GFR: 75.9 mL/min (ref >60.00)
Glucose, Bld: 121 mg/dL — ABNORMAL HIGH (ref 70–99)
Potassium: 3.4 mEq/L — ABNORMAL LOW (ref 3.5–5.1)
Sodium: 139 mEq/L (ref 135–145)
Total Bilirubin: 0.9 mg/dL (ref 0.2–1.2)
Total Protein: 7.3 g/dL (ref 6.0–8.3)

## 2015-09-13 LAB — HEMOGLOBIN A1C: Hgb A1c MFr Bld: 6.1 % (ref 4.6–6.5)

## 2015-09-13 LAB — TSH: TSH: 4.18 u[IU]/mL (ref 0.35–4.50)

## 2015-09-16 ENCOUNTER — Ambulatory Visit (INDEPENDENT_AMBULATORY_CARE_PROVIDER_SITE_OTHER): Payer: BLUE CROSS/BLUE SHIELD | Admitting: Family Medicine

## 2015-09-16 ENCOUNTER — Encounter: Payer: Self-pay | Admitting: Family Medicine

## 2015-09-16 VITALS — BP 162/92 | HR 93 | Temp 98.5°F | Ht 73.0 in | Wt 290.0 lb

## 2015-09-16 DIAGNOSIS — Z119 Encounter for screening for infectious and parasitic diseases, unspecified: Secondary | ICD-10-CM

## 2015-09-16 DIAGNOSIS — Z0000 Encounter for general adult medical examination without abnormal findings: Secondary | ICD-10-CM | POA: Diagnosis not present

## 2015-09-16 DIAGNOSIS — I10 Essential (primary) hypertension: Secondary | ICD-10-CM

## 2015-09-16 MED ORDER — SPIRONOLACTONE 25 MG PO TABS: ORAL_TABLET | ORAL | Status: DC

## 2015-09-16 MED ORDER — LEVOTHYROXINE SODIUM 112 MCG PO TABS: ORAL_TABLET | ORAL | Status: DC

## 2015-09-16 MED ORDER — QUINAPRIL HCL 40 MG PO TABS: ORAL_TABLET | ORAL | Status: DC

## 2015-09-16 MED ORDER — AMLODIPINE-ATORVASTATIN 10-10 MG PO TABS: 1.0000 | ORAL_TABLET | Freq: Every day | ORAL | Status: DC

## 2015-09-16 MED ORDER — POTASSIUM CHLORIDE CRYS ER 20 MEQ PO TBCR: EXTENDED_RELEASE_TABLET | ORAL | Status: DC

## 2015-09-16 NOTE — Patient Instructions (Addendum)
Check with your insurance to see which counselors are in network.  I'll look at the list.   Check with your insurance to see if they will cover the shingles shot. Note the dose change on the caduet.   Update me about your BP in about 1 week.  Take care. Glad to see you.

## 2015-09-16 NOTE — Progress Notes (Signed)
Pre visit review using our clinic review tool, if applicable. No additional management support is needed unless otherwise documented below in the visit note.  CPE- See plan.  Routine anticipatory guidance given to patient.  See health maintenance.  Flu 04/2015 Tetanus 2015 PNA due at 67 Shingles d/w pt.  Colonoscopy 2011.  Prev with PSA elevation, it has resolved after abx treatment, he had f/u with urology. He has plans for a f/u PSA work physical later this year.  Living will d/w pt. Wife designated if patient were incapacitated.  Diet and exercise d/w pt, both are "sorry."  "I've been trying."  Time constraints are the issue.  D/w pt.   Chronic life stressors noted, likely affecting his BP.  He's having more worries, early waking.  Wanted to talk to a counselor.  No SI/HI.   Hypertension:    Using medication without problems or lightheadedness: yes Chest pain with exertion:no Edema:no Short of breath:no Average home BPs: up to 123456 systolic, see above.    PMH and SH reviewed  Meds, vitals, and allergies reviewed.   ROS: See HPI.  Otherwise negative.    GEN: nad, alert and oriented HEENT: mucous membranes moist NECK: supple w/o LA CV: rrr. PULM: ctab, no inc wob ABD: soft, +bs EXT: no edema SKIN: no acute rash

## 2015-09-17 NOTE — Assessment & Plan Note (Addendum)
Labs d/w pt.  Continue statin, but inc amlodipine to 10mg  a day.  rx changed.  D/w pt about about weight reduction for sugar, lipids, htn, etc.  He agrees. K okay with current meds.  He has h/o low K and still needs repletion. He'll update me re: BP next week.

## 2015-09-17 NOTE — Assessment & Plan Note (Addendum)
Flu 04/2015   Tetanus 2015  PNA due at 67  Shingles d/w pt.  Colonoscopy 2011.  Prev with PSA elevation, it has resolved after abx treatment, he had f/u with urology. He has plans for a f/u PSA work physical later this year.  Living will d/w pt. Wife designated if patient were incapacitated.  Diet and exercise d/w pt, both are "sorry." "I've been trying." Time constraints are the issue. D/w pt.  Pt opts in for HCV and HIV screening.  D/w pt re: routine screening.   He'll check on counselors in the area and update me if he needs input/referral. Okay for outpatient f/u.

## 2015-11-19 ENCOUNTER — Encounter: Payer: Self-pay | Admitting: Family Medicine

## 2015-11-19 ENCOUNTER — Ambulatory Visit (INDEPENDENT_AMBULATORY_CARE_PROVIDER_SITE_OTHER): Payer: BLUE CROSS/BLUE SHIELD | Admitting: Family Medicine

## 2015-11-19 VITALS — BP 136/78 | HR 92 | Temp 98.6°F | Wt 290.0 lb

## 2015-11-19 DIAGNOSIS — R945 Abnormal results of liver function studies: Secondary | ICD-10-CM

## 2015-11-19 DIAGNOSIS — Z119 Encounter for screening for infectious and parasitic diseases, unspecified: Secondary | ICD-10-CM

## 2015-11-19 DIAGNOSIS — R7989 Other specified abnormal findings of blood chemistry: Secondary | ICD-10-CM

## 2015-11-19 NOTE — Patient Instructions (Signed)
Go to the lab on the way out.  We'll contact you with your lab report. Take care.  Glad to see you.  

## 2015-11-19 NOTE — Progress Notes (Signed)
Pre visit review using our clinic review tool, if applicable. No additional management support is needed unless otherwise documented below in the visit note.  Prev LFTs were fine here in 2017.  Mild elevation in LFTs for work CPE.  No abd sx, jaundice, vomiting, diarrhea.  Already on statin.  CBC was normal Tbili was normal also.  Prev vaccinated for HBV.  No known HAV or HEV exposures.    D/w pt about prev ordered labs, HIV and HCV.    Meds, vitals, and allergies reviewed.   ROS: See HPI.  Otherwise, noncontributory.  GEN: nad, alert and oriented NECK: supple w/o LA CV: rrr.  PULM: ctab, no inc wob ABD: soft, +bs, not ttp, no rebound.  No jaundice.

## 2015-11-20 LAB — HEPATIC FUNCTION PANEL
ALT: 48 U/L — ABNORMAL HIGH (ref 9–46)
AST: 32 U/L (ref 10–35)
Albumin: 4.5 g/dL (ref 3.6–5.1)
Alkaline Phosphatase: 63 U/L (ref 40–115)
Bilirubin, Direct: 0.2 mg/dL (ref <=0.2)
Indirect Bilirubin: 0.6 mg/dL (ref 0.2–1.2)
Total Bilirubin: 0.8 mg/dL (ref 0.2–1.2)
Total Protein: 7.1 g/dL (ref 6.1–8.1)

## 2015-11-20 LAB — HEPATITIS C ANTIBODY: HCV Ab: NEGATIVE

## 2015-11-20 LAB — HIV ANTIBODY (ROUTINE TESTING W REFLEX): HIV 1&2 Ab, 4th Generation: NONREACTIVE

## 2015-11-21 DIAGNOSIS — R945 Abnormal results of liver function studies: Secondary | ICD-10-CM

## 2015-11-21 DIAGNOSIS — R7989 Other specified abnormal findings of blood chemistry: Secondary | ICD-10-CM | POA: Insufficient documentation

## 2015-11-21 NOTE — Assessment & Plan Note (Signed)
No sx, normal exam, see notes on f/u labs.  If wnl or improved, then no w/u needed.  D/w pt. He agrees.

## 2015-11-22 ENCOUNTER — Encounter: Payer: Self-pay | Admitting: *Deleted

## 2015-11-26 ENCOUNTER — Ambulatory Visit (INDEPENDENT_AMBULATORY_CARE_PROVIDER_SITE_OTHER): Payer: BLUE CROSS/BLUE SHIELD | Admitting: Family Medicine

## 2015-11-26 ENCOUNTER — Ambulatory Visit (INDEPENDENT_AMBULATORY_CARE_PROVIDER_SITE_OTHER)
Admission: RE | Admit: 2015-11-26 | Discharge: 2015-11-26 | Disposition: A | Discharge status: Home / Self Care | Payer: BLUE CROSS/BLUE SHIELD | Source: Ambulatory Visit | Attending: Family Medicine | Admitting: Family Medicine

## 2015-11-26 ENCOUNTER — Encounter: Payer: Self-pay | Admitting: Family Medicine

## 2015-11-26 VITALS — BP 126/76 | HR 100 | Temp 98.6°F | Wt 288.0 lb

## 2015-11-26 DIAGNOSIS — M79672 Pain in left foot: Secondary | ICD-10-CM | POA: Diagnosis not present

## 2015-11-26 LAB — URIC ACID: Uric Acid, Serum: 6 mg/dL (ref 4.0–7.8)

## 2015-11-26 LAB — CBC WITH DIFFERENTIAL/PLATELET
Basophils Absolute: 0 cells/uL (ref 0–200)
Basophils Relative: 0 %
Eosinophils Absolute: 71 cells/uL (ref 15–500)
Eosinophils Relative: 1 %
HCT: 42.6 % (ref 38.5–50.0)
Hemoglobin: 14.3 g/dL (ref 13.2–17.1)
Lymphocytes Relative: 19 %
Lymphs Abs: 1349 cells/uL (ref 850–3900)
MCH: 30.4 pg (ref 27.0–33.0)
MCHC: 33.6 g/dL (ref 32.0–36.0)
MCV: 90.4 fL (ref 80.0–100.0)
MPV: 9.7 fL (ref 7.5–12.5)
Monocytes Absolute: 568 cells/uL (ref 200–950)
Monocytes Relative: 8 %
Neutro Abs: 5112 cells/uL (ref 1500–7800)
Neutrophils Relative %: 72 %
Platelets: 242 10*3/uL (ref 140–400)
RBC: 4.71 MIL/uL (ref 4.20–5.80)
RDW: 13.9 % (ref 11.0–15.0)
WBC: 7.1 10*3/uL (ref 3.8–10.8)

## 2015-11-26 MED ORDER — NAPROXEN 500 MG PO TABS: ORAL_TABLET | ORAL | Status: DC

## 2015-11-26 NOTE — Progress Notes (Signed)
   BP 126/76 mmHg  Pulse 100  Temp(Src) 98.6 F (37 C) (Oral)  Wt 288 lb (130.636 kg)  SpO2 95%   CC: L foot pain  Subjective:    Patient ID: Carlos Foster, male    DOB: February 10, 1951, 65 y.o.   MRN: [id number]  HPI: Carlos Foster is a 65 y.o. male presenting on 11/26/2015 for Foot Pain   5d h/o R foot pain without inciting trauma. Describes throbbing numb ache. No redness/warmth. Worse pain with bending foot at MTPs  Taking aleve for this without significant improvement. No ice/heat.   Today feeling better but worse the longer he's on his feet.  H/o Morton's neuroma on right.  No h/o gout. He did eat more hot dogs this weekend.   [employer].  Relevant past medical, surgical, family and social history reviewed and updated as indicated. Interim medical history since our last visit reviewed. Allergies and medications reviewed and updated. Current Outpatient Prescriptions on File Prior to Visit  Medication Sig  . amlodipine-atorvastatin (CADUET) 10-10 MG tablet Take 1 tablet by mouth daily.  Marland Kitchen doxycycline (DORYX) 100 MG EC tablet Take 100 mg by mouth 2 (two) times daily. For rosacea  . levothyroxine (SYNTHROID, LEVOTHROID) 112 MCG tablet Take 1 tablet by mouth  daily except for 1 and 1/2  tablets by mouth on Sunday  . metroNIDAZOLE (METROGEL) 1 % gel Apply 1 application  topically daily as needed.  . Multiple Vitamin (MULTIVITAMIN) tablet Take 1 tablet by mouth daily.    . potassium chloride SA (K-DUR,KLOR-CON) 20 MEQ tablet Take 2 tablets by mouth two times daily  . quinapril (ACCUPRIL) 40 MG tablet Take 1 tablet by mouth at  bedtime  . spironolactone (ALDACTONE) 25 MG tablet Take 2 tablets by mouth  daily   No current facility-administered medications on file prior to visit.    Review of Systems Per HPI unless specifically indicated in ROS section     Objective:    BP 126/76 mmHg  Pulse 100  Temp(Src) 98.6 F (37 C) (Oral)  Wt 288 lb (130.636 kg)  SpO2 95%  Wt  Readings from Last 3 Encounters:  11/26/15 288 lb (130.636 kg)  11/19/15 290 lb (131.543 kg)  09/16/15 290 lb (131.543 kg)    Physical Exam  Constitutional: He appears well-developed and well-nourished. No distress.  Musculoskeletal: He exhibits no edema.  2+ DP bilaterally R foot WNL L foot tender to palpation at ball of foot below 2nd MT head and at dorsal 2nd MT head, not significant pain at 2nd MT shaft, no pain with axial loading of toes. No pain at 1st MTP joint.  FROM of ankle and toes No palpable nodule appreciated Mild loss of transverse on left  Skin: Skin is warm and dry. No rash noted.  Nursing note and vitals reviewed.     Assessment & Plan:   Problem List Items Addressed This Visit    Left foot pain - Primary    Pain localizes to 2nd MTP joint on left but no active synovitis. ?gout vs capsulitis. Not consistent with MT stress fracture or with morton's neuroma. ?mild collapse of transverse arch predisposing. Overall has supportive shoes.       Relevant Orders   DG Foot Complete Left   Uric acid   CBC with Differential/Platelet       Follow up plan: Return if symptoms worsen or fail to improve, for follow up visit.  Ria Bush, MD

## 2015-11-26 NOTE — Assessment & Plan Note (Signed)
Pain localizes to 2nd MTP joint on left but no active synovitis. ?gout vs capsulitis. Not consistent with MT stress fracture or with morton's neuroma. ?mild collapse of transverse arch predisposing. Overall has supportive shoes.

## 2015-11-26 NOTE — Addendum Note (Signed)
Addended by: Royann Shivers A on: 11/26/2015 03:43 PM   Modules accepted: Orders

## 2015-11-26 NOTE — Patient Instructions (Addendum)
Possible capsulitis (inflammation of capsule of 2nd toe joint) or maybe gouty arthritis. Labs today (uric acid and blood count). Start naprosyn 500mg  twice daily for 5 days with food then as needed. Rest foot as able, elevate foot when seated. Update [place] if not improving with treatment - we will check foot xray.

## 2015-11-26 NOTE — Progress Notes (Signed)
Pre visit review using our clinic review tool, if applicable. No additional management support is needed unless otherwise documented below in the visit note. 

## 2015-11-30 ENCOUNTER — Other Ambulatory Visit: Payer: Self-pay | Admitting: Family Medicine

## 2015-11-30 DIAGNOSIS — M79672 Pain in left foot: Secondary | ICD-10-CM

## 2015-11-30 DIAGNOSIS — M779 Enthesopathy, unspecified: Secondary | ICD-10-CM

## 2015-12-09 ENCOUNTER — Encounter: Payer: Self-pay | Admitting: Family Medicine

## 2015-12-09 LAB — HEMOGLOBIN
ALT: 59
AST: 56 U/L
Creatinine, Ser: 1.11
Glucose: 108
Hemoglobin: 15.4
Prostate Specific Ag, Serum: 3.6
TSH: 3.29

## 2015-12-15 LAB — PULMONARY FUNCTION TEST

## 2015-12-31 ENCOUNTER — Encounter (HOSPITAL_BASED_OUTPATIENT_CLINIC_OR_DEPARTMENT_OTHER): Payer: Self-pay | Admitting: *Deleted

## 2016-01-04 ENCOUNTER — Encounter (HOSPITAL_BASED_OUTPATIENT_CLINIC_OR_DEPARTMENT_OTHER)
Admission: RE | Admit: 2016-01-04 | Discharge: 2016-01-04 | Disposition: A | Discharge status: Home / Self Care | Payer: BLUE CROSS/BLUE SHIELD | Source: Ambulatory Visit | Attending: Orthopedic Surgery | Admitting: Orthopedic Surgery

## 2016-01-04 DIAGNOSIS — M25375 Other instability, left foot: Secondary | ICD-10-CM | POA: Diagnosis not present

## 2016-01-04 DIAGNOSIS — Z79899 Other long term (current) drug therapy: Secondary | ICD-10-CM | POA: Diagnosis not present

## 2016-01-04 DIAGNOSIS — Z792 Long term (current) use of antibiotics: Secondary | ICD-10-CM | POA: Diagnosis not present

## 2016-01-04 DIAGNOSIS — M7742 Metatarsalgia, left foot: Secondary | ICD-10-CM | POA: Diagnosis not present

## 2016-01-04 DIAGNOSIS — M2042 Other hammer toe(s) (acquired), left foot: Secondary | ICD-10-CM | POA: Diagnosis not present

## 2016-01-04 DIAGNOSIS — E785 Hyperlipidemia, unspecified: Secondary | ICD-10-CM | POA: Diagnosis not present

## 2016-01-04 DIAGNOSIS — E039 Hypothyroidism, unspecified: Secondary | ICD-10-CM | POA: Diagnosis not present

## 2016-01-04 DIAGNOSIS — I10 Essential (primary) hypertension: Secondary | ICD-10-CM | POA: Diagnosis not present

## 2016-01-04 DIAGNOSIS — E876 Hypokalemia: Secondary | ICD-10-CM | POA: Diagnosis not present

## 2016-01-04 DIAGNOSIS — S93692A Other sprain of left foot, initial encounter: Secondary | ICD-10-CM | POA: Diagnosis not present

## 2016-01-04 DIAGNOSIS — X58XXXA Exposure to other specified factors, initial encounter: Secondary | ICD-10-CM | POA: Diagnosis not present

## 2016-01-04 LAB — BASIC METABOLIC PANEL
Anion gap: 5 (ref 5–15)
BUN: 23 mg/dL — ABNORMAL HIGH (ref 6–20)
CO2: 26 mmol/L (ref 22–32)
Calcium: 9.4 mg/dL (ref 8.9–10.3)
Chloride: 108 mmol/L (ref 101–111)
Creatinine, Ser: 1.1 mg/dL (ref 0.61–1.24)
GFR calc Af Amer: >60 mL/min (ref >60)
GFR calc non Af Amer: >60 mL/min (ref >60)
Glucose, Bld: 101 mg/dL — ABNORMAL HIGH (ref 65–99)
Potassium: 5 mmol/L (ref 3.5–5.1)
Sodium: 139 mmol/L (ref 135–145)

## 2016-01-06 ENCOUNTER — Encounter (HOSPITAL_BASED_OUTPATIENT_CLINIC_OR_DEPARTMENT_OTHER)
Admission: RE | Disposition: A | Discharge status: Home / Self Care | Payer: Self-pay | Source: Ambulatory Visit | Attending: Orthopedic Surgery

## 2016-01-06 ENCOUNTER — Ambulatory Visit (HOSPITAL_BASED_OUTPATIENT_CLINIC_OR_DEPARTMENT_OTHER): Payer: BLUE CROSS/BLUE SHIELD | Admitting: Certified Registered"

## 2016-01-06 ENCOUNTER — Encounter (HOSPITAL_BASED_OUTPATIENT_CLINIC_OR_DEPARTMENT_OTHER): Payer: Self-pay | Admitting: Certified Registered"

## 2016-01-06 ENCOUNTER — Ambulatory Visit (HOSPITAL_BASED_OUTPATIENT_CLINIC_OR_DEPARTMENT_OTHER)
Admission: RE | Admit: 2016-01-06 | Discharge: 2016-01-06 | Disposition: A | Discharge status: Home / Self Care | Payer: BLUE CROSS/BLUE SHIELD | Source: Ambulatory Visit | Attending: Orthopedic Surgery | Admitting: Orthopedic Surgery

## 2016-01-06 DIAGNOSIS — E876 Hypokalemia: Secondary | ICD-10-CM | POA: Insufficient documentation

## 2016-01-06 DIAGNOSIS — E039 Hypothyroidism, unspecified: Secondary | ICD-10-CM | POA: Insufficient documentation

## 2016-01-06 DIAGNOSIS — Z792 Long term (current) use of antibiotics: Secondary | ICD-10-CM | POA: Insufficient documentation

## 2016-01-06 DIAGNOSIS — M7742 Metatarsalgia, left foot: Secondary | ICD-10-CM | POA: Insufficient documentation

## 2016-01-06 DIAGNOSIS — I10 Essential (primary) hypertension: Secondary | ICD-10-CM | POA: Insufficient documentation

## 2016-01-06 DIAGNOSIS — M25375 Other instability, left foot: Secondary | ICD-10-CM | POA: Insufficient documentation

## 2016-01-06 DIAGNOSIS — M2042 Other hammer toe(s) (acquired), left foot: Secondary | ICD-10-CM | POA: Insufficient documentation

## 2016-01-06 DIAGNOSIS — X58XXXA Exposure to other specified factors, initial encounter: Secondary | ICD-10-CM | POA: Insufficient documentation

## 2016-01-06 DIAGNOSIS — S93692A Other sprain of left foot, initial encounter: Secondary | ICD-10-CM | POA: Insufficient documentation

## 2016-01-06 DIAGNOSIS — E785 Hyperlipidemia, unspecified: Secondary | ICD-10-CM | POA: Insufficient documentation

## 2016-01-06 DIAGNOSIS — Z79899 Other long term (current) drug therapy: Secondary | ICD-10-CM | POA: Insufficient documentation

## 2016-01-06 HISTORY — PX: HAMMER TOE SURGERY: SHX385

## 2016-01-06 HISTORY — DX: Unspecified osteoarthritis, unspecified site: M19.90

## 2016-01-06 HISTORY — PX: METATARSAL OSTEOTOMY: SHX1641

## 2016-01-06 HISTORY — DX: Hypothyroidism, unspecified: E03.9

## 2016-01-06 SURGERY — OSTEOTOMY, METATARSAL BONE
Anesthesia: General | Laterality: Left

## 2016-01-06 MED ORDER — MIDAZOLAM HCL 2 MG/2ML IJ SOLN
INTRAMUSCULAR | Status: AC
  Filled 2016-01-06: qty 2

## 2016-01-06 MED ORDER — FENTANYL CITRATE (PF) 100 MCG/2ML IJ SOLN
50.0000 ug | INTRAMUSCULAR | Status: DC | PRN
  Administered 2016-01-06: 100 ug via INTRAVENOUS

## 2016-01-06 MED ORDER — MIDAZOLAM HCL 2 MG/2ML IJ SOLN
1.0000 mg | INTRAMUSCULAR | Status: DC | PRN
  Administered 2016-01-06: 2 mg via INTRAVENOUS

## 2016-01-06 MED ORDER — PROMETHAZINE HCL 25 MG/ML IJ SOLN: 6.2500 mg | INTRAMUSCULAR | Status: DC | PRN

## 2016-01-06 MED ORDER — PROPOFOL 10 MG/ML IV BOLUS
INTRAVENOUS | Status: DC | PRN
  Administered 2016-01-06: 200 mg via INTRAVENOUS

## 2016-01-06 MED ORDER — CEFAZOLIN SODIUM 10 G IJ SOLR
3.0000 g | INTRAMUSCULAR | Status: AC
  Administered 2016-01-06: 3 g via INTRAVENOUS

## 2016-01-06 MED ORDER — CEFAZOLIN SODIUM-DEXTROSE 2-4 GM/100ML-% IV SOLN
INTRAVENOUS | Status: AC
  Filled 2016-01-06: qty 200

## 2016-01-06 MED ORDER — EPHEDRINE SULFATE 50 MG/ML IJ SOLN
INTRAMUSCULAR | Status: DC | PRN
  Administered 2016-01-06 (×5): 10 mg via INTRAVENOUS

## 2016-01-06 MED ORDER — SCOPOLAMINE 1 MG/3DAYS TD PT72: 1.0000 | MEDICATED_PATCH | Freq: Once | TRANSDERMAL | Status: DC | PRN

## 2016-01-06 MED ORDER — DEXAMETHASONE SODIUM PHOSPHATE 10 MG/ML IJ SOLN
INTRAMUSCULAR | Status: DC | PRN
  Administered 2016-01-06: 10 mg via INTRAVENOUS

## 2016-01-06 MED ORDER — FENTANYL CITRATE (PF) 100 MCG/2ML IJ SOLN
INTRAMUSCULAR | Status: AC
  Filled 2016-01-06: qty 2

## 2016-01-06 MED ORDER — OXYCODONE HCL 5 MG PO TABS: 5.0000 mg | ORAL_TABLET | ORAL | Status: DC | PRN

## 2016-01-06 MED ORDER — LACTATED RINGERS IV SOLN
INTRAVENOUS | Status: DC
  Administered 2016-01-06 (×2): via INTRAVENOUS

## 2016-01-06 MED ORDER — GLYCOPYRROLATE 0.2 MG/ML IJ SOLN: 0.2000 mg | Freq: Once | INTRAMUSCULAR | Status: DC | PRN

## 2016-01-06 MED ORDER — CHLORHEXIDINE GLUCONATE 4 % EX LIQD: 60.0000 mL | Freq: Once | CUTANEOUS | Status: DC

## 2016-01-06 MED ORDER — HYDROMORPHONE HCL 1 MG/ML IJ SOLN: 0.2500 mg | INTRAMUSCULAR | Status: DC | PRN

## 2016-01-06 MED ORDER — SODIUM CHLORIDE 0.9 % IV SOLN: INTRAVENOUS | Status: DC

## 2016-01-06 SURGICAL SUPPLY — 81 items
BANDAGE ESMARK 6X9 LF (GAUZE/BANDAGES/DRESSINGS) ×1 IMPLANT
BLADE AVERAGE 25MMX9MM (BLADE) ×1
BLADE AVERAGE 25X9 (BLADE) ×2 IMPLANT
BLADE OSC/SAG .038X5.5 CUT EDG (BLADE) ×3 IMPLANT
BLADE SURG 15 STRL LF DISP TIS (BLADE) ×2 IMPLANT
BLADE SURG 15 STRL SS (BLADE) ×4
BNDG COHESIVE 4X5 TAN STRL (GAUZE/BANDAGES/DRESSINGS) ×3 IMPLANT
BNDG COHESIVE 6X5 TAN STRL LF (GAUZE/BANDAGES/DRESSINGS) ×3 IMPLANT
BNDG CONFORM 2 STRL LF (GAUZE/BANDAGES/DRESSINGS) IMPLANT
BNDG CONFORM 3 STRL LF (GAUZE/BANDAGES/DRESSINGS) ×3 IMPLANT
BNDG ESMARK 4X9 LF (GAUZE/BANDAGES/DRESSINGS) IMPLANT
BNDG ESMARK 6X9 LF (GAUZE/BANDAGES/DRESSINGS) ×3
CAP PIN PROTECTOR ORTHO WHT (CAP) IMPLANT
CHLORAPREP W/TINT 26ML (MISCELLANEOUS) ×3 IMPLANT
COVER BACK TABLE 60X90IN (DRAPES) ×3 IMPLANT
CUFF TOURNIQUET SINGLE 34IN LL (TOURNIQUET CUFF) IMPLANT
CUFF TOURNIQUET SINGLE 44IN (TOURNIQUET CUFF) IMPLANT
DECANTER SPIKE VIAL GLASS SM (MISCELLANEOUS) IMPLANT
DRAPE EXTREMITY T 121X128X90 (DRAPE) ×3 IMPLANT
DRAPE OEC MINIVIEW 54X84 (DRAPES) ×3 IMPLANT
DRAPE U-SHAPE 47X51 STRL (DRAPES) ×3 IMPLANT
DRSG MEPITEL 4X7.2 (GAUZE/BANDAGES/DRESSINGS) ×3 IMPLANT
DRSG PAD ABDOMINAL 8X10 ST (GAUZE/BANDAGES/DRESSINGS) ×6 IMPLANT
ELECT REM PT RETURN 9FT ADLT (ELECTROSURGICAL) ×3
ELECTRODE REM PT RTRN 9FT ADLT (ELECTROSURGICAL) ×1 IMPLANT
GAUZE SPONGE 4X4 12PLY STRL (GAUZE/BANDAGES/DRESSINGS) ×3 IMPLANT
GLOVE BIO SURGEON STRL SZ8 (GLOVE) ×3 IMPLANT
GLOVE BIOGEL PI IND STRL 8 (GLOVE) ×2 IMPLANT
GLOVE BIOGEL PI INDICATOR 8 (GLOVE) ×4
GLOVE ECLIPSE 7.5 STRL STRAW (GLOVE) ×3 IMPLANT
GLOVE EXAM NITRILE MD LF STRL (GLOVE) IMPLANT
GOWN STRL REUS W/ TWL LRG LVL3 (GOWN DISPOSABLE) ×1 IMPLANT
GOWN STRL REUS W/ TWL XL LVL3 (GOWN DISPOSABLE) ×2 IMPLANT
GOWN STRL REUS W/TWL LRG LVL3 (GOWN DISPOSABLE) ×2
GOWN STRL REUS W/TWL XL LVL3 (GOWN DISPOSABLE) ×4
GUIDEWIRE ORTHO 0.054X7 SS (WIRE) IMPLANT
K-WIRE .054X4 (WIRE) IMPLANT
K-WIRE COCR 1.1X105 (WIRE) ×3
K-WIRE DBL END .054 LG (WIRE) IMPLANT
KWIRE COCR 1.1X105 (WIRE) ×1 IMPLANT
NDL SUT 6 .5 CRC .975X.05 MAYO (NEEDLE) ×1 IMPLANT
NEEDLE HYPO 22GX1.5 SAFETY (NEEDLE) IMPLANT
NEEDLE HYPO 25X1 1.5 SAFETY (NEEDLE) IMPLANT
NEEDLE MAYO TAPER (NEEDLE) ×2
NS IRRIG 1000ML POUR BTL (IV SOLUTION) ×3 IMPLANT
PACK BASIN DAY SURGERY FS (CUSTOM PROCEDURE TRAY) ×3 IMPLANT
PAD CAST 4YDX4 CTTN HI CHSV (CAST SUPPLIES) ×1 IMPLANT
PADDING CAST ABS 4INX4YD NS (CAST SUPPLIES)
PADDING CAST ABS COTTON 4X4 ST (CAST SUPPLIES) IMPLANT
PADDING CAST COTTON 4X4 STRL (CAST SUPPLIES) ×2
PADDING CAST COTTON 6X4 STRL (CAST SUPPLIES) ×3 IMPLANT
PASSER SUT SWANSON 36MM LOOP (INSTRUMENTS) IMPLANT
PENCIL BUTTON HOLSTER BLD 10FT (ELECTRODE) ×3 IMPLANT
RETRIEVER SUT HEWSON (MISCELLANEOUS) IMPLANT
SANITIZER HAND PURELL 535ML FO (MISCELLANEOUS) ×3 IMPLANT
SCREW HCS TWIST-OFF 2.0X14MM (Screw) ×3 IMPLANT
SCREW VPC 3.4X30MM (Screw) ×1 IMPLANT
SHEET MEDIUM DRAPE 40X70 STRL (DRAPES) ×3 IMPLANT
SLEEVE SCD COMPRESS KNEE MED (MISCELLANEOUS) ×3 IMPLANT
SPLINT FAST PLASTER 5X30 (CAST SUPPLIES) ×40
SPLINT PLASTER CAST FAST 5X30 (CAST SUPPLIES) ×20 IMPLANT
SPONGE LAP 18X18 X RAY DECT (DISPOSABLE) ×3 IMPLANT
STOCKINETTE 6  STRL (DRAPES) ×2
STOCKINETTE 6 STRL (DRAPES) ×1 IMPLANT
SUCTION FRAZIER HANDLE 10FR (MISCELLANEOUS)
SUCTION TUBE FRAZIER 10FR DISP (MISCELLANEOUS) IMPLANT
SUT ETHILON 3 0 PS 1 (SUTURE) ×3 IMPLANT
SUT MERSILENE 2.0 SH NDLE (SUTURE) IMPLANT
SUT MNCRL AB 3-0 PS2 18 (SUTURE) ×3 IMPLANT
SUT VIC AB 0 SH 27 (SUTURE) IMPLANT
SUT VIC AB 2-0 SH 27 (SUTURE) ×2
SUT VIC AB 2-0 SH 27XBRD (SUTURE) ×1 IMPLANT
SUT VICRYL 0 UR6 27IN ABS (SUTURE) ×3 IMPLANT
SYR BULB 3OZ (MISCELLANEOUS) ×3 IMPLANT
SYR CONTROL 10ML LL (SYRINGE) IMPLANT
TOWEL OR 17X24 6PK STRL BLUE (TOWEL DISPOSABLE) ×6 IMPLANT
TUBE CONNECTING 20'X1/4 (TUBING)
TUBE CONNECTING 20X1/4 (TUBING) IMPLANT
UNDERPAD 30X30 (UNDERPADS AND DIAPERS) ×3 IMPLANT
VPC SCREW 3.4X30MM (Screw) ×3 IMPLANT
YANKAUER SUCT BULB TIP NO VENT (SUCTIONS) IMPLANT

## 2016-01-06 NOTE — Brief Op Note (Signed)
01/06/2016  1:02 PM  PATIENT:  Carlos Foster  65 y.o. male  PRE-OPERATIVE DIAGNOSIS: 1.  left second hammertoe       2.  Left forefoot metatarsalgia      3.  Left 2nd MTP joint instability  POST-OPERATIVE DIAGNOSIS: 1.  Left 2nd hammertoe      2.  Left forefoot metatarsalgia      3.  Left 2nd MTPJ plantar plate rupture  Procedure(s): 1.  Left 2nd MT Weil osteotomy   2.  Left 2nd MTPJ plantar plate repair   3.  Left 2nd hammertoe correction with flexor to extensor transfer   4.  Left 2nd toe open flexor tenotomy   5.  Left foot AP and lateral xrays  SURGEON:  Wylene Simmer, MD  ASSISTANT: n/a  ANESTHESIA:   General, regional  EBL:  minimal   TOURNIQUET:   Total Tourniquet Time Documented: Thigh (Left) - 51 minutes Total: Thigh (Left) - 51 minutes  COMPLICATIONS:  None apparent  DISPOSITION:  Extubated, awake and stable to recovery.  DICTATION ID:

## 2016-01-06 NOTE — Anesthesia Procedure Notes (Addendum)
Procedure Name: LMA Insertion Performed by: Jartavious Mckimmy D Pre-anesthesia Checklist: Patient identified, Emergency Drugs available, Suction available and Patient being monitored Patient Re-evaluated:Patient Re-evaluated prior to inductionOxygen Delivery Method: Circle system utilized Preoxygenation: Pre-oxygenation with 100% oxygen Intubation Type: IV induction Ventilation: Mask ventilation without difficulty LMA: LMA inserted LMA Size: 4.0 Number of attempts: 1 Airway Equipment and Method: Bite block Placement Confirmation: positive ETCO2 Tube secured with: Tape Dental Injury: Teeth and Oropharynx as per pre-operative assessment     Anesthesia Regional Block:  Popliteal block  Pre-Anesthetic Checklist: ,, timeout performed, Correct Patient, Correct Site, Correct Laterality, Correct Procedure, Correct Position, site marked, Risks and benefits discussed,  Surgical consent,  Pre-op evaluation,  At surgeon's request and post-op pain management  Laterality: Left  Prep: chloraprep       Needles:  Injection technique: Single-shot  Needle Type: Echogenic Stimulator Needle          Additional Needles:  Procedures: ultrasound guided (picture in chart) and nerve stimulator Popliteal block  Nerve Stimulator or Paresthesia:  Response: plantar flexion, 0.45 mA,   Additional Responses:   Narrative:  Start time: 01/06/2016 11:02 AM End time: 01/06/2016 11:12 AM Injection made incrementally with aspirations every 5 mL.  Performed by: Personally  Anesthesiologist: Duane Boston  Additional Notes: A functioning IV was confirmed and monitors were applied.  Sterile prep and drape, hand hygiene and sterile gloves were used.  Negative aspiration and test dose prior to incremental administration of local anesthetic. The patient tolerated the procedure well.Ultrasound  guidance: relevant anatomy identified, needle position confirmed, local anesthetic spread visualized around nerve(s),  vascular puncture avoided.  Image printed for medical record.    Procedure Name: LMA Insertion Date/Time: 01/06/2016 11:34 AM Performed by: Brylie Sneath D Pre-anesthesia Checklist: Patient identified, Emergency Drugs available, Suction available and Patient being monitored Patient Re-evaluated:Patient Re-evaluated prior to inductionOxygen Delivery Method: Circle system utilized Preoxygenation: Pre-oxygenation with 100% oxygen Intubation Type: IV induction Ventilation: Mask ventilation without difficulty LMA: LMA inserted LMA Size: 4.0 Number of attempts: 1 Airway Equipment and Method: Bite block Placement Confirmation: positive ETCO2 Tube secured with: Tape Dental Injury: Teeth and Oropharynx as per pre-operative assessment

## 2016-01-06 NOTE — Progress Notes (Signed)
Assisted Dr. Singer with left, ultrasound guided, popliteal block. Side rails up, monitors on throughout procedure. See vital signs in flow sheet. Tolerated Procedure well. 

## 2016-01-06 NOTE — H&P (Signed)
Carlos Foster is an 65 y.o. male.   Chief Complaint: left forefoot pain HPI: 65 y/o male without significant PMH c/o L forefoot pain for the last several months.  He has developed a painful hammertoe deformity with metatarsalgia.  He presents now for operative treatment.  Past Medical History  Diagnosis Date  . Hypertension   . Thyroid disease     Hypothyroidism  . Hyperlipidemia   . Hypokalemia   . Rosacea   . Hypothyroidism   . Arthritis     feet    Past Surgical History  Procedure Laterality Date  . Morton's neuroma repair  1982  . Hemorrhoid surgery  1982  . Laminectomy  12/02/99    C4/5, 5/6, 6/7 Fusion (Dr. Joya Salm)  . Ett  04/15/03    wnl ST decreased < 33m V6  . Prostate biopsy  12/10/06    Benign (Jeffie Pollock  . Hernia repair Bilateral 2015    Family History  Problem Relation Age of Onset  . Kidney disease Mother     Acute kidney failure  . Heart disease Mother     CAD  . Hypertension Mother   . Diabetes Mother     DM and diabetic retinopathy, toe amputation  . Lymphoma Maternal Uncle     X 2  . Depression Neg Hx   . Alcohol abuse Neg Hx   . Drug abuse Neg Hx   . Stroke Neg Hx   . Prostate cancer Neg Hx   . Colon cancer Neg Hx   . Diabetes Other   . Heart disease Father   . AAA (abdominal aortic aneurysm) Father    Social History:  reports that he has never smoked. He has never used smokeless tobacco. He reports that he does not drink alcohol or use illicit drugs.  Allergies: No Known Allergies  Medications Prior to Admission  Medication Sig Dispense Refill  . amlodipine-atorvastatin (CADUET) 10-10 MG tablet Take 1 tablet by mouth daily. 90 tablet 3  . doxycycline (DORYX) 100 MG EC tablet Take 100 mg by mouth 2 (two) times daily. For rosacea    . levothyroxine (SYNTHROID, LEVOTHROID) 112 MCG tablet Take 1 tablet by mouth  daily except for 1 and 1/2  tablets by mouth on Sunday 100 tablet 3  . metroNIDAZOLE (METROGEL) 1 % gel Apply 1 application  topically  daily as needed. 120 g 5  . Multiple Vitamin (MULTIVITAMIN) tablet Take 1 tablet by mouth daily.      . naproxen (NAPROSYN) 500 MG tablet Take one tablet twice with food for 5 days, then one tablet twice a day with food as needed. 60 tablet 0  . potassium chloride SA (K-DUR,KLOR-CON) 20 MEQ tablet Take 2 tablets by mouth two times daily 360 tablet 3  . quinapril (ACCUPRIL) 40 MG tablet Take 1 tablet by mouth at  bedtime 90 tablet 3  . spironolactone (ALDACTONE) 25 MG tablet Take 2 tablets by mouth  daily 180 tablet 3    Results for orders placed or performed during the hospital encounter of 01/06/16 (from the past 48 hour(s))  Basic metabolic panel     Status: Abnormal   Collection Time: 01/04/16 12:40 PM  Result Value Ref Range   Sodium 139 135 - 145 mmol/L   Potassium 5.0 3.5 - 5.1 mmol/L   Chloride 108 101 - 111 mmol/L   CO2 26 22 - 32 mmol/L   Glucose, Bld 101 (H) 65 - 99 mg/dL   BUN 23 (H)  6 - 20 mg/dL   Creatinine, Ser 1.10 0.61 - 1.24 mg/dL   Calcium 9.4 8.9 - 10.3 mg/dL   GFR calc non Af Amer >60 >60 mL/min   GFR calc Af Amer >60 >60 mL/min    Comment: (NOTE) The eGFR has been calculated using the CKD EPI equation. This calculation has not been validated in all clinical situations. eGFR's persistently <60 mL/min signify possible Chronic Kidney Disease.    Anion gap 5 5 - 15   No results found.  ROS  No recent f/c/n/v/wt loss  Blood pressure 142/90, pulse 93, temperature 98.5 F (36.9 C), temperature source Oral, resp. rate 20, height '6\' 1"'  (1.854 m), weight 128.822 kg (284 lb), SpO2 96 %. Physical Exam  wn wd male in nad.  A and O x 4.  Mood and affect normal.  EOMI.  resp unlabored.  L forefoot with 2nd hammertoe deformity.  Skin healthy and intact.  Sens to LT intact.  No lymphadenopathy.  5/5 strength in PF and DF of the ankle and toes.  Assessment/Plan L 2nd hammertoe deformity with metatarsalgia and MTP joint instability - to OR for 2nd MT Weil osteotomy,  hammertoe correction and flexor to extensor transfer.  The risks and benefits of the alternative treatment options have been discussed in detail.  The patient wishes to proceed with surgery and specifically understands risks of bleeding, infection, nerve damage, blood clots, need for additional surgery, amputation and death.   Wylene Simmer, MD 2016-02-02, 11:05 AM

## 2016-01-06 NOTE — Op Note (Signed)
NAMEMCKENZIE, NUDD NO.:  [id number]  MEDICAL RECORD NO.:  [id number]  LOCATION:                                 FACILITY:  PHYSICIAN:  Wylene Simmer, MD             DATE OF BIRTH:  DATE OF PROCEDURE:  01/06/2016 DATE OF DISCHARGE:                              OPERATIVE REPORT   PREOPERATIVE DIAGNOSES: 1. Left second hammertoe deformity. 2. Left forefoot metatarsalgia. 3. Left second MTP joint instability.  POSTOPERATIVE DIAGNOSES: 1. Left second hammertoe deformity. 2. Left forefoot metatarsalgia. 3. Left second MTP joint plantar plate rupture.  PROCEDURE: 1. Left second metatarsal Weil osteotomy. 2. Left second MTP joint plantar plate repair through a separate     incision. 3. Left second hammertoe correction with flexor to extensor transfer. 4. Left second toe open flexor tenotomy. 5. Left foot AP and lateral radiographs.  SURGEON:  Wylene Simmer, MD.  ANESTHESIA:  General, regional.  ESTIMATED BLOOD LOSS:  Minimal.  TOURNIQUET TIME:  51 minutes at 250 mmHg.  COMPLICATIONS:  None apparent.  DISPOSITION:  Extubated, awake, and stable to recovery.  INDICATION FOR PROCEDURE:  The patient is a 65 year old male, who complains of left forefoot pain now for the last couple of months.  He is significantly limited in his activities.  Radiographs and MRI suggest a plantar plate rupture.  He has also developed hammertoe deformity and has signs and symptoms of synovitis and metatarsalgia.  He presents now for operative treatment of these painful conditions.  He understands the risks, benefits, the alternative treatment options, and elects surgical treatment.  He specifically understands risks of bleeding, infection, nerve damage, blood clots, need for additional surgery, continued pain, nonunion, amputation, and death.  PROCEDURE IN DETAIL:  After preoperative consent was obtained and the correct operative site was identified, the patient was brought  to the operating room and placed supine on the operating table.  General anesthesia was induced.  Preoperative antibiotics were administered. Surgical time-out was taken.  The left lower extremity was prepped and draped in standard sterile fashion with a tourniquet around the thigh. The extremity was exsanguinated and tourniquet was inflated to 250 mmHg. A longitudinal incision was then made over the second MTP joint.  Sharp dissection was carried down through the skin and subcutaneous tissue. The extensor tendons were mobilized.  An arthrotomy was then, made and the dorsal joint capsule was incised and elevated.  There was significant synovial fluid as well as synovitis.  This was all sharply debrided.  The metatarsal head was exposed, and a Weil osteotomy was made with the oscillating saw.  A small wedge of bone was removed distally.  The head of the metatarsal was allowed to retract proximally several millimeters, and a 2 mm Biomet FRS screw was used to fix the osteotomy site.  The overhanging bone was trimmed with a rongeur.  Plantar plate was then carefully inspected.  There was a tear noted involving approximately 2/3rd of the width of the structure.  Attention was then turned to the plantar aspect of the forefoot, where an oblique Brunner style incision was made beneath the second  MTP joint. Sharp dissection was carried down through skin and subcutaneous tissue. A longitudinal incision was made in the flexor tendon sheath.  An open tenotomy was then performed, and the flexor digitorum longus tendon was pulled out through the proximal incision.  It was split longitudinally and both ends were tagged with 2-0 Vicryl.  The plantar plate was inspected and from the plantar surface and the tear was identified.  The toe was held in a reduced position.  0 Vicryl figure-of-eight sutures were then placed in the plantar plate repairing the tear appropriately.  The ends of the flexor tendon  were then passed along the base of the proximal phalanx and pulled out through the dorsal incision.  These were tagged for later use.  A transverse incision was then made over the PIP joint.  Sharp dissection was carried down through the skin and subcutaneous tissue in extensor mechanism.  The collateral ligaments were released.  The head of the proximal phalanx was resected along with the base of the middle phalanx.  The joint was reduced and fixed with a Biomet VPC 3.5 mm screw.  The screw was noted to have excellent purchase.  The toe was then reduced.  The flexor digitorum longus tendon was repaired over the dorsum of the proximal phalanx using horizontal mattress sutures of 0 Vicryl.  The tourniquet was released.  Wounds were irrigated.  Hemostasis was achieved.  The plantar incisions were closed with horizontal mattress sutures of 3-0 nylon.  The dorsal incision was closed with Monocryl and nylon.  Sterile dressings were applied followed by a compression wrap.  Final AP and lateral radiographs confirmed appropriate position and length of all hardware and appropriate correction of the second hammertoe deformity.  The patient was then awakened from anesthesia and transported to the recovery room in stable condition.  FOLLOWUP PLAN:  The patient will be weightbearing as tolerated in a flat postop shoe.  He will follow up with me in the office in 2 weeks for suture removal.  RADIOGRAPHS:  AP and lateral radiographs of the left foot were obtained intraoperatively.  These show interval correction of the second hammertoe deformity.  Hardware is appropriately positioned and of the appropriate length.  No other acute injuries are noted.     Wylene Simmer, MD     JH/MEDQ  D:  01/06/2016  T:  01/06/2016  Job:  [id number]

## 2016-01-06 NOTE — Discharge Instructions (Addendum)
Wylene Simmer, MD [place]  Please read the following information regarding your care after surgery.  Medications  You only need a prescription for the narcotic pain medicine (ex. oxycodone, Percocet, Norco).  All of the other medicines listed below are available over the counter. X acetominophen (Tylenol) 650 mg every 4-6 hours as you need for minor pain X oxycodone as prescribed for moderate to severe pain ?   Narcotic pain medicine (ex. oxycodone, Percocet, Vicodin) will cause constipation.  To prevent this problem, take the following medicines while you are taking any pain medicine. X docusate sodium (Colace) 100 mg twice a day X senna (Senokot) 2 tablets twice a day  ? To help prevent blood clots, take a baby aspirin (81 mg) twice a day for two weeks after surgery.  You should also get up every hour while you are awake to move around.    Weight Bearing X Bear weight when you are able on your operated leg or foot in the post-op shoe.  ? Bear weight only on the heel of your operated foot? Do not bear any weight on the operated leg or foot.  Cast / Splint / Dressing X Keep your dressing clean and dry.  Dont put anything (coat hanger, pencil, etc) down inside of it.  If it gets damp, use a hair dryer on the cool setting to dry it.  If it gets soaked, call the office to schedule an appointment for a cast change. ? Remove your dressing 3 days after surgery and cover the incisions with dry dressings.    After your dressing, cast or splint is removed; you may shower, but do not soak or scrub the wound.  Allow the water to run over it, and then gently pat it dry.  Swelling It is normal for you to have swelling where you had surgery.  To reduce swelling and pain, keep your toes above your nose for at least 3 days after surgery.  It may be necessary to keep your foot or leg elevated for several weeks.  If it hurts, it should be elevated.  Follow Up Call my office at  [phone] when you are discharged from the hospital or surgery center to schedule an appointment to be seen two weeks after surgery.  Call my office at [phone] if you develop a fever >101.5 F, nausea, vomiting, bleeding from the surgical site or severe pain.     Regional Anesthesia Blocks  1. Numbness or the inability to move the "blocked" extremity may last from 3-48 hours after placement. The length of time depends on the medication injected and your individual response to the medication. If the numbness is not going away after 48 hours, call your surgeon.  2. The extremity that is blocked will need to be protected until the numbness is gone and the  Strength has returned. Because you cannot feel it, you will need to take extra care to avoid injury. Because it may be weak, you may have difficulty moving it or using it. You may not know what position it is in without looking at it while the block is in effect.  3. For blocks in the legs and feet, returning to weight bearing and walking needs to be done carefully. You will need to wait until the numbness is entirely gone and the strength has returned. You should be able to move your leg and foot normally before you try and bear weight or walk. You will need someone to be with  you when you first try to ensure you do not fall and possibly risk injury.  4. Bruising and tenderness at the needle site are common side effects and will resolve in a few days.  5. Persistent numbness or new problems with movement should be communicated to the surgeon or the [facility] [phone]).      Post Anesthesia Home Care Instructions  Activity: Get plenty of rest for the remainder of the day. A responsible adult should stay with you for 24 hours following the procedure.  For the next 24 hours, DO NOT: -Drive a car -Paediatric nurse -Drink alcoholic beverages -Take any medication unless  instructed by your physician -Make any legal decisions or sign important papers.  Meals: Start with liquid foods such as gelatin or soup. Progress to regular foods as tolerated. Avoid greasy, spicy, heavy foods. If nausea and/or vomiting occur, drink only clear liquids until the nausea and/or vomiting subsides. Call your physician if vomiting continues.  Special Instructions/Symptoms: Your throat may feel dry or sore from the anesthesia or the breathing tube placed in your throat during surgery. If this causes discomfort, gargle with warm salt water. The discomfort should disappear within 24 hours.  If you had a scopolamine patch placed behind your ear for the management of post- operative nausea and/or vomiting:  1. The medication in the patch is effective for 72 hours, after which it should be removed.  Wrap patch in a tissue and discard in the trash. Wash hands thoroughly with soap and water. 2. You may remove the patch earlier than 72 hours if you experience unpleasant side effects which may include dry mouth, dizziness or visual disturbances. 3. Avoid touching the patch. Wash your hands with soap and water after contact with the patch.

## 2016-01-06 NOTE — Transfer of Care (Signed)
Immediate Anesthesia Transfer of Care Note  Patient: Carlos Foster  Procedure(s) Performed: Procedure(s): LEFT SECOND METATARSAL WEIL OSTEOTOMY (Left) LEFT SECOND HAMMER TOE CORRECTION (Left) LEFT FLEXOR TO EXTENSOR TRANSFER (Left)  Patient Location: PACU  Anesthesia Type:GA combined with regional for post-op pain  Level of Consciousness: awake and patient cooperative  Airway & Oxygen Therapy: Patient Spontanous Breathing and Patient connected to face mask oxygen  Post-op Assessment: Report given to RN and Post -op Vital signs reviewed and stable  Post vital signs: Reviewed and stable  Last Vitals:  Filed Vitals:   01/06/16 1120 01/06/16 1125  BP: 143/82   Pulse: 92 96  Temp:    Resp: 15 13    Last Pain:  Filed Vitals:   01/06/16 1125  PainSc: 5       Patients Stated Pain Goal: 2 ([id number])  Complications: No apparent anesthesia complications

## 2016-01-06 NOTE — Anesthesia Postprocedure Evaluation (Signed)
Anesthesia Post Note  Patient: Carlos Foster  Procedure(s) Performed: Procedure(s) (LRB): LEFT SECOND METATARSAL WEIL OSTEOTOMY (Left) LEFT SECOND HAMMER TOE CORRECTION (Left) LEFT FLEXOR TO EXTENSOR TRANSFER (Left)  Patient location during evaluation: PACU Anesthesia Type: General Level of consciousness: sedated Pain management: pain level controlled Vital Signs Assessment: post-procedure vital signs reviewed and stable Respiratory status: spontaneous breathing and respiratory function stable Cardiovascular status: stable Anesthetic complications: no    Last Vitals:  Filed Vitals:   01/06/16 1330 01/06/16 1345  BP: 124/70 133/77  Pulse: 92 94  Temp:    Resp: 16 13    Last Pain:  Filed Vitals:   01/06/16 1346  PainSc: 0-No pain                 [name] DANIEL

## 2016-01-06 NOTE — Anesthesia Preprocedure Evaluation (Signed)
Anesthesia Evaluation  Patient identified by MRN, date of birth, ID band Patient awake    Reviewed: Allergy & Precautions, NPO status , Patient's Chart, lab work & pertinent test results  History of Anesthesia Complications Negative for: history of anesthetic complications  Airway Mallampati: II  TM Distance: >3 FB Neck ROM: Full    Dental no notable dental hx. (+) Teeth Intact   Pulmonary neg pulmonary ROS,    Pulmonary exam normal        Cardiovascular hypertension, Normal cardiovascular exam     Neuro/Psych negative neurological ROS  negative psych ROS   GI/Hepatic negative GI ROS, Neg liver ROS,   Endo/Other  Hypothyroidism   Renal/GU negative Renal ROS     Musculoskeletal   Abdominal   Peds  Hematology   Anesthesia Other Findings   Reproductive/Obstetrics                             Anesthesia Physical Anesthesia Plan  ASA: II  Anesthesia Plan: General   Post-op Pain Management: GA combined w/ Regional for post-op pain   Induction: Intravenous  Airway Management Planned: LMA  Additional Equipment:   Intra-op Plan:   Post-operative Plan: Extubation in OR  Informed Consent: I have reviewed the patients History and Physical, chart, labs and discussed the procedure including the risks, benefits and alternatives for the proposed anesthesia with the patient or authorized representative who has indicated his/her understanding and acceptance.   Dental advisory given  Plan Discussed with: CRNA, Anesthesiologist and Surgeon  Anesthesia Plan Comments:         Anesthesia Quick Evaluation

## 2016-01-07 ENCOUNTER — Encounter (HOSPITAL_BASED_OUTPATIENT_CLINIC_OR_DEPARTMENT_OTHER): Payer: Self-pay | Admitting: Orthopedic Surgery

## 2016-06-07 ENCOUNTER — Ambulatory Visit (INDEPENDENT_AMBULATORY_CARE_PROVIDER_SITE_OTHER)
Admission: RE | Admit: 2016-06-07 | Discharge: 2016-06-07 | Disposition: A | Discharge status: Home / Self Care | Payer: BLUE CROSS/BLUE SHIELD | Source: Ambulatory Visit | Attending: Family Medicine | Admitting: Family Medicine

## 2016-06-07 ENCOUNTER — Ambulatory Visit (INDEPENDENT_AMBULATORY_CARE_PROVIDER_SITE_OTHER): Payer: BLUE CROSS/BLUE SHIELD | Admitting: Family Medicine

## 2016-06-07 ENCOUNTER — Encounter: Payer: Self-pay | Admitting: Family Medicine

## 2016-06-07 VITALS — BP 158/90 | HR 100 | Temp 98.6°F | Wt 297.0 lb

## 2016-06-07 DIAGNOSIS — R202 Paresthesia of skin: Secondary | ICD-10-CM | POA: Diagnosis not present

## 2016-06-07 MED ORDER — PREDNISONE 20 MG PO TABS: ORAL_TABLET | ORAL | 0 refills | Status: DC

## 2016-06-07 MED ORDER — SPIRONOLACTONE 25 MG PO TABS: ORAL_TABLET | ORAL | 3 refills | Status: DC

## 2016-06-07 MED ORDER — AMLODIPINE-ATORVASTATIN 10-10 MG PO TABS: 1.0000 | ORAL_TABLET | Freq: Every day | ORAL | 3 refills | Status: DC

## 2016-06-07 MED ORDER — LEVOTHYROXINE SODIUM 112 MCG PO TABS: ORAL_TABLET | ORAL | 3 refills | Status: DC

## 2016-06-07 MED ORDER — QUINAPRIL HCL 40 MG PO TABS: ORAL_TABLET | ORAL | 3 refills | Status: DC

## 2016-06-07 MED ORDER — POTASSIUM CHLORIDE CRYS ER 20 MEQ PO TBCR
EXTENDED_RELEASE_TABLET | ORAL | 3 refills | Status: DC
Start: 2016-06-07 — End: 2016-10-13

## 2016-06-07 NOTE — Progress Notes (Signed)
Now with change in sensation in the L hand and arm for about 8 weeks.  He prev had some numbness after prev surgery but this is more diffuse now with inability to feel hot vs cold.  Some tingling and some occ burning.  No hand weakness.  No R sided sx.    Now with numbness in the L thigh.  Some radicular pain on the L leg.  Sx started about 4-5 months.  On foot drop.  No leg weakness.    No B/B sx.  No FCNAVD.  No specific injury to cause either set of sx recently.  Took some aleve w/minimal relief of back pain, no change in hand sx.    History of previous neck surgery noted.  PMH and SH reviewed  ROS: Per HPI unless specifically indicated in ROS section   Meds, vitals, and allergies reviewed.   GEN: nad, alert and oriented HEENT: mucous membranes moist NECK: supple w/o LA, normal ROM CV: rrr.  PULM: ctab, no inc wob ABD: soft, +bs EXT: no edema CN 2-12 wnl B, S/S wnl x4 except for decrease in sensation to cold on the left hand compared to the right and altered sensation to light touch on the left forearm and left thigh compared to the right forearm and right thigh respectively. No foot drop. No weakness.

## 2016-06-07 NOTE — Patient Instructions (Signed)
Go to the lab on the way out.  We'll contact you with your xray report. Prednisone with food.  Update me if not better so we can set up MRI vs seeing Dr. Joya Salm vs both.  Take care.  Glad to see you.

## 2016-06-07 NOTE — Progress Notes (Signed)
Pre visit review using our clinic review tool, if applicable. No additional management support is needed unless otherwise documented below in the visit note. 

## 2016-06-08 ENCOUNTER — Encounter: Payer: Self-pay | Admitting: Family Medicine

## 2016-06-08 DIAGNOSIS — R202 Paresthesia of skin: Secondary | ICD-10-CM | POA: Insufficient documentation

## 2016-06-08 NOTE — Assessment & Plan Note (Signed)
Discussed with patient about his condition. He does not have any emergent symptoms. He very likely has a radicular issue in both his neck and his lower back causing symptoms in the left arm and left leg respectively. He does not have any weakness. His previous surgical history as noted. Reasonable to check C-spine and L-spine films today. If his symptoms continue or if his plain films are unrevealing, we may need to set up MRI of either or both regions. He has seen neurosurgery before, Dr. Joya Salm, about his neck, we can refer back if needed. In the meantime stop NSAIDs and begin prednisone course with routine cautions. He agrees. He will update me if not better. Okay for outpatient f/u.

## 2016-06-13 ENCOUNTER — Encounter: Payer: Self-pay | Admitting: Family Medicine

## 2016-06-13 LAB — GLUCOSE (CC13)
ALT: 39
AST: 30 U/L
Cholesterol, Total: 145
Creatinine, Ser: 1.25
Glucose: 109
HDL Cholesterol: 39
Hemoglobin: 14.1
LDL Cholesterol (Calc): 86
Potassium: 4.2 mmol/L
TSH: 7.47
Thyroxine (T4): 7.2
Triglycerides: 101
Uric Acid: 6.7

## 2016-06-15 ENCOUNTER — Telehealth: Payer: Self-pay

## 2016-06-15 DIAGNOSIS — R202 Paresthesia of skin: Secondary | ICD-10-CM

## 2016-06-15 NOTE — Telephone Encounter (Signed)
MRI ordered.  We'll go from there.  Thanks.

## 2016-06-15 NOTE — Telephone Encounter (Signed)
Patient calls to follow up with Dr. Damita Dunnings and report that his numbness in his hand has not improved on the prednisone.    He would like to know what next steps are in regards to a possible referral or MRI and mentioned at his visit last week.  Please advise.

## 2016-06-16 ENCOUNTER — Other Ambulatory Visit: Payer: Self-pay | Admitting: Family Medicine

## 2016-06-16 ENCOUNTER — Other Ambulatory Visit (INDEPENDENT_AMBULATORY_CARE_PROVIDER_SITE_OTHER): Payer: BLUE CROSS/BLUE SHIELD

## 2016-06-16 DIAGNOSIS — I10 Essential (primary) hypertension: Secondary | ICD-10-CM

## 2016-06-16 NOTE — Telephone Encounter (Signed)
Left detailed message on voicemail.  

## 2016-06-16 NOTE — Addendum Note (Signed)
Addended by: Jacqualin Combes on: 06/16/2016 10:39 AM   Modules accepted: Orders

## 2016-06-19 LAB — CREATININE, SERUM: Creat: 1.31 mg/dL — ABNORMAL HIGH (ref 0.70–1.25)

## 2016-06-19 LAB — BUN+CREAT: BUN/Creatinine Ratio: 16.8 Ratio (ref 6–22)

## 2016-06-19 LAB — BUN: BUN: 22 mg/dL (ref 7–25)

## 2016-06-21 ENCOUNTER — Ambulatory Visit (HOSPITAL_COMMUNITY): Payer: BLUE CROSS/BLUE SHIELD

## 2016-06-27 ENCOUNTER — Ambulatory Visit (HOSPITAL_COMMUNITY): Payer: BLUE CROSS/BLUE SHIELD

## 2016-06-28 ENCOUNTER — Ambulatory Visit (HOSPITAL_COMMUNITY)
Admission: RE | Admit: 2016-06-28 | Discharge: 2016-06-28 | Disposition: A | Discharge status: Home / Self Care | Payer: BLUE CROSS/BLUE SHIELD | Source: Ambulatory Visit | Attending: Family Medicine | Admitting: Family Medicine

## 2016-06-28 DIAGNOSIS — R202 Paresthesia of skin: Secondary | ICD-10-CM | POA: Insufficient documentation

## 2016-06-28 DIAGNOSIS — M4802 Spinal stenosis, cervical region: Secondary | ICD-10-CM | POA: Diagnosis not present

## 2016-06-28 DIAGNOSIS — M5021 Other cervical disc displacement,  high cervical region: Secondary | ICD-10-CM | POA: Diagnosis not present

## 2016-06-28 DIAGNOSIS — M2578 Osteophyte, vertebrae: Secondary | ICD-10-CM | POA: Insufficient documentation

## 2016-06-28 MED ORDER — GADOBENATE DIMEGLUMINE 529 MG/ML IV SOLN
20.0000 mL | Freq: Once | INTRAVENOUS | Status: AC | PRN
  Administered 2016-06-28: 20 mL via INTRAVENOUS

## 2016-06-29 ENCOUNTER — Other Ambulatory Visit: Payer: Self-pay | Admitting: Family Medicine

## 2016-06-29 DIAGNOSIS — M4802 Spinal stenosis, cervical region: Secondary | ICD-10-CM

## 2016-06-29 DIAGNOSIS — R202 Paresthesia of skin: Secondary | ICD-10-CM

## 2016-07-09 HISTORY — PX: OTHER SURGICAL HISTORY: SHX169

## 2016-07-21 ENCOUNTER — Encounter (HOSPITAL_COMMUNITY)
Admission: EM | Disposition: A | Discharge status: Home / Self Care | Payer: Self-pay | Source: Home / Self Care | Attending: Neurosurgery

## 2016-07-21 ENCOUNTER — Emergency Department (HOSPITAL_COMMUNITY): Payer: BLUE CROSS/BLUE SHIELD | Admitting: Certified Registered"

## 2016-07-21 ENCOUNTER — Inpatient Hospital Stay (HOSPITAL_COMMUNITY)
Admission: EM | Admit: 2016-07-21 | Discharge: 2016-07-23 | DRG: 909 | Disposition: A | Discharge status: Home / Self Care | Payer: BLUE CROSS/BLUE SHIELD | Attending: Neurosurgery | Admitting: Neurosurgery

## 2016-07-21 ENCOUNTER — Emergency Department (HOSPITAL_COMMUNITY): Payer: BLUE CROSS/BLUE SHIELD

## 2016-07-21 ENCOUNTER — Encounter (HOSPITAL_COMMUNITY): Payer: Self-pay | Admitting: Emergency Medicine

## 2016-07-21 DIAGNOSIS — J988 Other specified respiratory disorders: Secondary | ICD-10-CM

## 2016-07-21 DIAGNOSIS — I10 Essential (primary) hypertension: Secondary | ICD-10-CM | POA: Diagnosis present

## 2016-07-21 DIAGNOSIS — E785 Hyperlipidemia, unspecified: Secondary | ICD-10-CM | POA: Diagnosis present

## 2016-07-21 DIAGNOSIS — L7634 Postprocedural seroma of skin and subcutaneous tissue following other procedure: Principal | ICD-10-CM | POA: Diagnosis present

## 2016-07-21 DIAGNOSIS — R1310 Dysphagia, unspecified: Secondary | ICD-10-CM

## 2016-07-21 DIAGNOSIS — E039 Hypothyroidism, unspecified: Secondary | ICD-10-CM | POA: Diagnosis present

## 2016-07-21 DIAGNOSIS — T888XXA Other specified complications of surgical and medical care, not elsewhere classified, initial encounter: Secondary | ICD-10-CM

## 2016-07-21 DIAGNOSIS — R21 Rash and other nonspecific skin eruption: Secondary | ICD-10-CM | POA: Diagnosis present

## 2016-07-21 DIAGNOSIS — Y838 Other surgical procedures as the cause of abnormal reaction of the patient, or of later complication, without mention of misadventure at the time of the procedure: Secondary | ICD-10-CM | POA: Diagnosis present

## 2016-07-21 HISTORY — PX: EVACUATION OF CERVICAL HEMATOMA: SHX6695

## 2016-07-21 LAB — COMPREHENSIVE METABOLIC PANEL
ALT: 29 U/L (ref 17–63)
AST: 24 U/L (ref 15–41)
Albumin: 4.2 g/dL (ref 3.5–5.0)
Alkaline Phosphatase: 71 U/L (ref 38–126)
Anion gap: 11 (ref 5–15)
BUN: 13 mg/dL (ref 6–20)
CO2: 27 mmol/L (ref 22–32)
Calcium: 9.3 mg/dL (ref 8.9–10.3)
Chloride: 102 mmol/L (ref 101–111)
Creatinine, Ser: 1.25 mg/dL — ABNORMAL HIGH (ref 0.61–1.24)
GFR calc Af Amer: >60 mL/min (ref >60)
GFR calc non Af Amer: 60 mL/min — ABNORMAL LOW (ref >60)
Glucose, Bld: 103 mg/dL — ABNORMAL HIGH (ref 65–99)
Potassium: 3.1 mmol/L — ABNORMAL LOW (ref 3.5–5.1)
Sodium: 140 mmol/L (ref 135–145)
Total Bilirubin: 1.2 mg/dL (ref 0.3–1.2)
Total Protein: 7.4 g/dL (ref 6.5–8.1)

## 2016-07-21 LAB — CBC
HCT: 43.5 % (ref 39.0–52.0)
Hemoglobin: 14.9 g/dL (ref 13.0–17.0)
MCH: 31 pg (ref 26.0–34.0)
MCHC: 34.3 g/dL (ref 30.0–36.0)
MCV: 90.4 fL (ref 78.0–100.0)
Platelets: 238 10*3/uL (ref 150–400)
RBC: 4.81 MIL/uL (ref 4.22–5.81)
RDW: 13.1 % (ref 11.5–15.5)
WBC: 9.6 10*3/uL (ref 4.0–10.5)

## 2016-07-21 SURGERY — EVACUATION OF CERVICAL HEMATOMA
Anesthesia: General | Site: Neck

## 2016-07-21 MED ORDER — IOPAMIDOL (ISOVUE-300) INJECTION 61%
INTRAVENOUS | Status: AC
  Administered 2016-07-21: 75 mL
  Filled 2016-07-21: qty 75

## 2016-07-21 MED ORDER — KETAMINE HCL-SODIUM CHLORIDE 100-0.9 MG/10ML-% IV SOSY
PREFILLED_SYRINGE | INTRAVENOUS | Status: AC
  Filled 2016-07-21: qty 10

## 2016-07-21 MED ORDER — MIDAZOLAM HCL 2 MG/2ML IJ SOLN
INTRAMUSCULAR | Status: AC
  Filled 2016-07-21: qty 2

## 2016-07-21 MED ORDER — THROMBIN 5000 UNITS EX SOLR
CUTANEOUS | Status: AC
  Filled 2016-07-21: qty 5000

## 2016-07-21 MED ORDER — THROMBIN 20000 UNITS EX SOLR
CUTANEOUS | Status: AC
  Filled 2016-07-21: qty 20000

## 2016-07-21 MED ORDER — FENTANYL CITRATE (PF) 100 MCG/2ML IJ SOLN
INTRAMUSCULAR | Status: AC
  Filled 2016-07-21: qty 2

## 2016-07-21 MED ORDER — SODIUM CHLORIDE 0.9 % IV SOLN
INTRAVENOUS | Status: DC | PRN
  Administered 2016-07-21: via INTRAVENOUS

## 2016-07-21 MED ORDER — VANCOMYCIN HCL 1000 MG IV SOLR
INTRAVENOUS | Status: AC
  Filled 2016-07-21: qty 1000

## 2016-07-21 MED ORDER — SUCCINYLCHOLINE CHLORIDE 20 MG/ML IJ SOLN
INTRAMUSCULAR | Status: DC | PRN
  Administered 2016-07-21: 100 mg via INTRAVENOUS

## 2016-07-21 MED ORDER — FENTANYL CITRATE (PF) 100 MCG/2ML IJ SOLN
INTRAMUSCULAR | Status: DC | PRN
  Administered 2016-07-21: 50 ug via INTRAVENOUS

## 2016-07-21 MED ORDER — ROCURONIUM BROMIDE 100 MG/10ML IV SOLN
INTRAVENOUS | Status: DC | PRN
  Administered 2016-07-21: 50 mg via INTRAVENOUS

## 2016-07-21 MED ORDER — MIDAZOLAM HCL 5 MG/5ML IJ SOLN
INTRAMUSCULAR | Status: DC | PRN
  Administered 2016-07-21: 2 mg via INTRAVENOUS

## 2016-07-21 MED ORDER — KETAMINE HCL 10 MG/ML IJ SOLN
INTRAMUSCULAR | Status: DC | PRN
  Administered 2016-07-21: 30 mg via INTRAVENOUS

## 2016-07-21 MED ORDER — CEFAZOLIN SODIUM-DEXTROSE 2-3 GM-% IV SOLR
INTRAVENOUS | Status: DC | PRN
  Administered 2016-07-21: 2 g via INTRAVENOUS

## 2016-07-21 MED ORDER — SODIUM CHLORIDE 0.9 % IV SOLN
Freq: Once | INTRAVENOUS | Status: AC
  Administered 2016-07-21: 23:00:00 via INTRAVENOUS

## 2016-07-21 MED ORDER — PROPOFOL 10 MG/ML IV BOLUS
INTRAVENOUS | Status: AC
  Filled 2016-07-21: qty 20

## 2016-07-21 MED ORDER — PROPOFOL 10 MG/ML IV BOLUS
INTRAVENOUS | Status: DC | PRN
  Administered 2016-07-21: 150 mg via INTRAVENOUS

## 2016-07-21 MED ORDER — DEXAMETHASONE SODIUM PHOSPHATE 10 MG/ML IJ SOLN
10.0000 mg | Freq: Once | INTRAMUSCULAR | Status: AC
  Administered 2016-07-21: 10 mg via INTRAVENOUS
  Filled 2016-07-21: qty 1

## 2016-07-21 MED ORDER — GLYCOPYRROLATE 0.2 MG/ML IJ SOLN
INTRAMUSCULAR | Status: DC | PRN
  Administered 2016-07-21: 0.4 mg via INTRAVENOUS

## 2016-07-21 SURGICAL SUPPLY — 58 items
BAG DECANTER FOR FLEXI CONT (MISCELLANEOUS) ×3 IMPLANT
BENZOIN TINCTURE PRP APPL 2/3 (GAUZE/BANDAGES/DRESSINGS) ×3 IMPLANT
BUR MATCHSTICK NEURO 3.0 LAGG (BURR) IMPLANT
CANISTER SUCT 3000ML PPV (MISCELLANEOUS) ×3 IMPLANT
CARTRIDGE OIL MAESTRO DRILL (MISCELLANEOUS) ×1 IMPLANT
CLOSURE WOUND 1/2 X4 (GAUZE/BANDAGES/DRESSINGS) ×1
DERMABOND ADVANCED (GAUZE/BANDAGES/DRESSINGS) ×2
DERMABOND ADVANCED .7 DNX12 (GAUZE/BANDAGES/DRESSINGS) ×1 IMPLANT
DIFFUSER DRILL AIR PNEUMATIC (MISCELLANEOUS) ×3 IMPLANT
DRAPE C-ARM 42X72 X-RAY (DRAPES) IMPLANT
DRAPE LAPAROTOMY 100X72 PEDS (DRAPES) ×3 IMPLANT
DRAPE MICROSCOPE LEICA (MISCELLANEOUS) IMPLANT
DRAPE POUCH INSTRU U-SHP 10X18 (DRAPES) ×3 IMPLANT
DRSG OPSITE POSTOP 4X6 (GAUZE/BANDAGES/DRESSINGS) ×3 IMPLANT
DURAPREP 6ML APPLICATOR 50/CS (WOUND CARE) ×3 IMPLANT
ELECT COATED BLADE 2.86 ST (ELECTRODE) ×3 IMPLANT
ELECT REM PT RETURN 9FT ADLT (ELECTROSURGICAL) ×3
ELECTRODE REM PT RTRN 9FT ADLT (ELECTROSURGICAL) ×1 IMPLANT
GAUZE SPONGE 4X4 12PLY STRL (GAUZE/BANDAGES/DRESSINGS) ×3 IMPLANT
GAUZE SPONGE 4X4 16PLY XRAY LF (GAUZE/BANDAGES/DRESSINGS) IMPLANT
GLOVE BIO SURGEON STRL SZ 6.5 (GLOVE) ×2 IMPLANT
GLOVE BIO SURGEON STRL SZ7 (GLOVE) ×3 IMPLANT
GLOVE BIO SURGEON STRL SZ8 (GLOVE) ×3 IMPLANT
GLOVE BIO SURGEONS STRL SZ 6.5 (GLOVE) ×1
GLOVE BIOGEL PI IND STRL 6.5 (GLOVE) ×1 IMPLANT
GLOVE BIOGEL PI INDICATOR 6.5 (GLOVE) ×2
GLOVE EXAM NITRILE LRG STRL (GLOVE) IMPLANT
GLOVE EXAM NITRILE XL STR (GLOVE) IMPLANT
GLOVE EXAM NITRILE XS STR PU (GLOVE) IMPLANT
GLOVE INDICATOR 8.5 STRL (GLOVE) ×3 IMPLANT
GOWN STRL REUS W/ TWL LRG LVL3 (GOWN DISPOSABLE) ×1 IMPLANT
GOWN STRL REUS W/ TWL XL LVL3 (GOWN DISPOSABLE) ×1 IMPLANT
GOWN STRL REUS W/TWL 2XL LVL3 (GOWN DISPOSABLE) IMPLANT
GOWN STRL REUS W/TWL LRG LVL3 (GOWN DISPOSABLE) ×2
GOWN STRL REUS W/TWL XL LVL3 (GOWN DISPOSABLE) ×2
HALTER HD/CHIN CERV TRACTION D (MISCELLANEOUS) ×3 IMPLANT
HEMOSTAT POWDER KIT SURGIFOAM (HEMOSTASIS) ×3 IMPLANT
KIT BASIN OR (CUSTOM PROCEDURE TRAY) ×3 IMPLANT
KIT ROOM TURNOVER OR (KITS) ×3 IMPLANT
NEEDLE HYPO 18GX1.5 BLUNT FILL (NEEDLE) IMPLANT
NEEDLE SPNL 20GX3.5 QUINCKE YW (NEEDLE) ×3 IMPLANT
NS IRRIG 1000ML POUR BTL (IV SOLUTION) ×3 IMPLANT
OIL CARTRIDGE MAESTRO DRILL (MISCELLANEOUS) ×3
PACK LAMINECTOMY NEURO (CUSTOM PROCEDURE TRAY) ×3 IMPLANT
PAD ARMBOARD 7.5X6 YLW CONV (MISCELLANEOUS) ×9 IMPLANT
RUBBERBAND STERILE (MISCELLANEOUS) IMPLANT
SEALANT ADHERUS EXTEND TIP (MISCELLANEOUS) ×3 IMPLANT
SPONGE INTESTINAL PEANUT (DISPOSABLE) ×6 IMPLANT
SPONGE SURGIFOAM ABS GEL SZ50 (HEMOSTASIS) IMPLANT
STRIP CLOSURE SKIN 1/2X4 (GAUZE/BANDAGES/DRESSINGS) ×2 IMPLANT
STRIP SURGICAL 1/2 X 6 IN (GAUZE/BANDAGES/DRESSINGS) ×3 IMPLANT
SUT VIC AB 3-0 SH 8-18 (SUTURE) ×3 IMPLANT
SUT VICRYL 4-0 PS2 18IN ABS (SUTURE) ×3 IMPLANT
TAPE CLOTH 4X10 WHT NS (GAUZE/BANDAGES/DRESSINGS) IMPLANT
TOWEL OR 17X24 6PK STRL BLUE (TOWEL DISPOSABLE) ×3 IMPLANT
TOWEL OR 17X26 10 PK STRL BLUE (TOWEL DISPOSABLE) ×3 IMPLANT
TRAP SPECIMEN MUCOUS 40CC (MISCELLANEOUS) ×3 IMPLANT
WATER STERILE IRR 1000ML POUR (IV SOLUTION) ×3 IMPLANT

## 2016-07-21 NOTE — ED Triage Notes (Signed)
Pt presents to ED for assessment after having cervical spine surgery.  Pt has had some difficulty with swallowing (which he states is "to be expected") since the surgery, bu states it has been worsening.  Redness and swelling noted to surgical site, face and head red as well.  Temp 99.2 at triage.

## 2016-07-21 NOTE — ED Notes (Signed)
Pt airway stable. Pt does not appear in any respiratory distress. PT able to talk in complete sentences. Pt unable to swallow his saliva.

## 2016-07-21 NOTE — ED Provider Notes (Signed)
[facility] DEPT Provider Note   CSN: [id number] Arrival date & time: 07/21/16  2017     History   Chief Complaint Chief Complaint  Patient presents with  . Post-op Problem  . Dysphagia    HPI Carlos Foster is a 65 y.o. male.  HPI 65 year old male who presents with difficulty swallowing. He is denying day postop from anterior cervical fusion. Over the past 5 days he's had slowly progressively worsening swelling over the neck with difficulty swallowing. States that it has worsened over the past 1-2 days where he is unable to swallow pills, and needs to cough them up. He has not had any difficulty breathing. Does note that his voice is more muffled, and today having some difficulty swallowing his saliva. No fevers or chills. No nausea or vomiting.   Past Medical History:  Diagnosis Date  . Arthritis    feet  . Hyperlipidemia   . Hypertension   . Hypokalemia   . Hypothyroidism   . Rosacea   . Thyroid disease    Hypothyroidism    Patient Active Problem List   Diagnosis Date Noted  . Paresthesia 06/08/2016  . Left foot pain 11/26/2015  . LFT elevation 11/21/2015  . Gynecomastia, male 02/26/2015  . Advance care planning 09/11/2014  . Hypokalemia 07/08/2012  . HLD (hyperlipidemia) 07/07/2011  . Routine general medical examination at a health care facility 07/07/2011  . Colon cancer screening 07/07/2011  . History of prostate biopsy 07/07/2011  . Hyperglycemia 05/15/2007  . Hypothyroidism 05/14/2007  . Essential hypertension 05/14/2007    Past Surgical History:  Procedure Laterality Date  . ETT  04/15/03   wnl ST decreased < 47mm V6  . HAMMER TOE SURGERY Left 01/06/2016   Procedure: LEFT SECOND HAMMER TOE CORRECTION;  Surgeon: Wylene Simmer, MD;  Location: [facility];  Service: Orthopedics;  Laterality: Left;  . [place]  . HERNIA REPAIR Bilateral 2015  . LAMINECTOMY  12/02/99   C4/5, 5/6, 6/7 Fusion (Dr. Joya Salm)  . METATARSAL  OSTEOTOMY Left 01/06/2016   Procedure: LEFT SECOND METATARSAL WEIL OSTEOTOMY;  Surgeon: Wylene Simmer, MD;  Location: [place];  Service: Orthopedics;  Laterality: Left;  Marland Kitchen Morton's Neuroma Repair  1982  . PROSTATE BIOPSY  12/10/06   Benign Jeffie Pollock)       Home Medications    Prior to Admission medications   Medication Sig Start Date End Date Taking? Authorizing Provider  amlodipine-atorvastatin (CADUET) 10-10 MG tablet Take 1 tablet by mouth daily. 06/07/16   Tonia Ghent, MD  doxycycline (DORYX) 100 MG EC tablet Take 100 mg by mouth 2 (two) times daily. For rosacea    Historical Provider, MD  levothyroxine (SYNTHROID, LEVOTHROID) 112 MCG tablet Take 1 tablet by mouth  daily except for 1 and 1/2  tablets by mouth on Sunday 06/07/16   Tonia Ghent, MD  metroNIDAZOLE (METROGEL) 1 % gel Apply 1 application  topically daily as needed. 12/08/14   Tonia Ghent, MD  Multiple Vitamin (MULTIVITAMIN) tablet Take 1 tablet by mouth daily.      Historical Provider, MD  potassium chloride SA (K-DUR,KLOR-CON) 20 MEQ tablet Take 2 tablets by mouth two times daily 06/07/16   Tonia Ghent, MD  predniSONE (DELTASONE) 20 MG tablet 2 a day for 4 days, then 1 a day for 4 days, then 0.5 a day for 4 days. With food. 06/07/16   Tonia Ghent, MD  quinapril (ACCUPRIL) 40 MG tablet  Take 1 tablet by mouth at  bedtime 06/07/16   Tonia Ghent, MD  spironolactone (ALDACTONE) 25 MG tablet Take 2 tablets by mouth  daily 06/07/16   Tonia Ghent, MD    Family History Family History  Problem Relation Age of Onset  . Kidney disease Mother     Acute kidney failure  . Heart disease Mother     CAD  . Hypertension Mother   . Diabetes Mother     DM and diabetic retinopathy, toe amputation  . Diabetes Other   . Heart disease Father   . AAA (abdominal aortic aneurysm) Father   . Lymphoma Maternal Uncle     X 2  . Depression Neg Hx   . Alcohol abuse Neg Hx   . Drug abuse Neg Hx   . Stroke Neg  Hx   . Prostate cancer Neg Hx   . Colon cancer Neg Hx     Social History Social History  Substance Use Topics  . Smoking status: Never Smoker  . Smokeless tobacco: Never Used  . Alcohol use No     Allergies   Patient has no known allergies.   Review of Systems Review of Systems 10/14 systems reviewed and are negative other than those stated in the HPI   Physical Exam Updated Vital Signs BP 150/92   Pulse 105   Temp 99.2 F (37.3 C) (Oral)   Resp 18   Ht 6\' 2"  (1.88 m)   Wt 287 lb (130.2 kg)   SpO2 96%   BMI 36.85 kg/m   Physical Exam Physical Exam  Nursing note and vitals reviewed. Constitutional: non-toxic, and in no acute distress Head: Normocephalic and atraumatic.  Mouth/Throat: Oropharynx is clear and moist.  Neck: Normal range of motion. Soft tissue swelling over left anterior neck Cardiovascular: Normal rate and regular rhythm.   Pulmonary/Chest: Effort normal and breath sounds normal. no stridor Abdominal: Soft. There is no tenderness. There is no rebound and no guarding.  Musculoskeletal: Normal range of motion.  Neurological: Alert, no facial droop, fluent speech, moves all extremities symmetrically, sensation to light touch in tact bilaterally Skin: Skin is warm and dry.  Psychiatric: Cooperative   ED Treatments / Results  Labs (all labs ordered are listed, but only abnormal results are displayed) Labs Reviewed  COMPREHENSIVE METABOLIC PANEL - Abnormal; Notable for the following:       Result Value   Potassium 3.1 (*)    Glucose, Bld 103 (*)    Creatinine, Ser 1.25 (*)    GFR calc non Af Amer 60 (*)    All other components within normal limits  CBC    EKG  EKG Interpretation None       Radiology Ct Soft Tissue Neck W Contrast  Result Date: 07/21/2016 CLINICAL DATA:  ACDF 07/11/2016. Difficulty swallowing. Globus sensation. EXAM: CT NECK WITH CONTRAST TECHNIQUE: Multidetector CT imaging of the neck was performed using the standard  protocol following the bolus administration of intravenous contrast. CONTRAST:  38mL ISOVUE-300 IOPAMIDOL (ISOVUE-300) INJECTION 61% COMPARISON:  Cervical spine radiograph 07/20/2016 FINDINGS: Pharynx and larynx: There is a retropharyngeal fluid collection the level of the hyoid bone, extending from the C3 to the C5 level predominantly. The collection measures approximately 4.2 cm transverse by 1.8 cm AP by 5.0 cm craniocaudal. A component of the collection extends superiorly anterior to C2 vertebral body. There is mass effect on the airway, with the airway narrowed to 4 mm at the level of the  epiglottis. In the subcutaneous fat and skin of the left neck, there are sequelae of recent anterior approach to the cervical spine. The nasopharynx is clear. The parapharyngeal spaces are preserved. Salivary glands: The parotid and submandibular glands are normal. No sialolithiasis or salivary ductal dilatation. Thyroid: Normal Lymph nodes: No enlarged or abnormal density cervical lymph nodes. Vascular: The major cervical vessels are normal. Limited intracranial: Normal Mastoids and visualized paranasal sinuses: Clear Skeleton: ACDF hardware extends from C2-C7.  No abnormal lucency. Upper chest: Clear Other: None IMPRESSION: 1. Large retropharyngeal fluid collection, anterior to the cervical fusion, measuring 4.2 x 1.8 x 5.0 cm. There is mass effect on the adjacent airway, narrowing the airway lumen to 4 mm at the level of the epiglottis. 2. In the setting of recent surgery, this is favored to represent a postoperative fluid collection, including the possibility of CSF leak. There are no specific features of infection. These results were discussed in person at the time of interpretation on 07/21/2016 at 10:25 pm with Dr. Kary Kos by Dr. Genia Del. Electronically Signed   By: Ulyses Jarred M.D.   On: 07/21/2016 22:27    Procedures Procedures (including critical care time) CRITICAL CARE Performed by: Forde Dandy   Total critical care time: 35 minutes  Critical care time was exclusive of separately billable procedures and treating other patients.  Critical care was necessary to treat or prevent imminent or life-threatening deterioration.  Critical care was time spent personally by me on the following activities: development of treatment plan with patient and/or surrogate as well as nursing, discussions with consultants, evaluation of patient's response to treatment, examination of patient, obtaining history from patient or surrogate, ordering and performing treatments and interventions, ordering and review of laboratory studies, ordering and review of radiographic studies, pulse oximetry and re-evaluation of patient's condition.   Medications Ordered in ED Medications  dexamethasone (DECADRON) injection 10 mg (10 mg Intravenous Given 07/21/16 2058)  iopamidol (ISOVUE-300) 61 % injection (75 mLs  Contrast Given 07/21/16 2131)  0.9 %  sodium chloride infusion ( Intravenous New Bag/Given 07/21/16 2313)     Initial Impression / Assessment and Plan / ED Course  I have reviewed the triage vital signs and the nursing notes.  Pertinent labs & imaging results that were available during my care of the patient were reviewed by me and considered in my medical decision making (see chart for details).  Clinical Course     Afebrile, hemodynamically stable. With muffled voice and some difficulty swallowing secretions, but breathing normally without stridor, hypoxia, or increased work of breathing. CT soft tissue of the neck visualized and shows postoperative fluid collection. Received decadron in ED, with some subjective improvement.  Discussed with Dr. Saintclair Halsted who will take patient to OR for evacuation of the fluid collection and admit.  Final Clinical Impressions(s) / ED Diagnoses   Final diagnoses:  Fluid collection at surgical site, initial encounter  Airway obstruction    New Prescriptions New  Prescriptions   No medications on file     Forde Dandy, MD 07/21/16 2329

## 2016-07-21 NOTE — H&P (Signed)
Carlos Foster is an 65 y.o. male.   Chief Complaint: Dysphagia HPI: Patient is a 65 year old gentleman is now about 9 days postop from an anterior cervical fusion. He was doing fairly well however over the last few days of progressive increasing difficulty swallowing to the point where now he is having a time getting pills down. He denies any difficulty breathing denies any new symptoms of numbness tingling in his arms. He has developed a rash and significant irritation around his face and lateral neck.  Past Medical History:  Diagnosis Date  . Arthritis    feet  . Hyperlipidemia   . Hypertension   . Hypokalemia   . Hypothyroidism   . Rosacea   . Thyroid disease    Hypothyroidism    Past Surgical History:  Procedure Laterality Date  . ETT  04/15/03   wnl ST decreased < 65m V6  . HAMMER TOE SURGERY Left 01/06/2016   Procedure: LEFT SECOND HAMMER TOE CORRECTION;  Surgeon: Carlos Simmer MD;  [place] [facility]  Service: Orthopedics;  [place]: [place];  [place] HERNIA REPAIR Bilateral 2015  . LAMINECTOMY  12/02/99   C4/5, 5/6, 6/7 Fusion (Dr. BJoya Foster  . METATARSAL OSTEOTOMY Left 01/06/2016   Procedure: LEFT SECOND METATARSAL WEIL OSTEOTOMY;  Surgeon: Carlos Simmer MD;  [place]  Service: Orthopedics;  Laterality: Left;  .Marland KitchenMorton's Neuroma Repair  1982  . PROSTATE BIOPSY  12/10/06   Benign (Carlos Foster    Family History  Problem Relation Age of Onset  . Kidney disease Mother     Acute kidney failure  . Heart disease Mother     CAD  . Hypertension Mother   . Diabetes Mother     DM and diabetic retinopathy, toe amputation  . Diabetes Other   . Heart disease Father   . AAA (abdominal aortic aneurysm) Father   . Lymphoma Maternal Uncle     X 2  . Depression Neg Hx   . Alcohol abuse Neg Hx   . Drug abuse Neg Hx   . Stroke Neg Hx   . Prostate cancer Neg Hx   . Colon cancer Neg Hx    Social History:  reports that he  has never smoked. He has never used smokeless tobacco. He reports that he does not drink alcohol or use drugs.  Allergies: No Known Allergies   (Not in a hospital admission)  Results for orders placed or performed during the hospital encounter of 07/21/16 (from the past 48 hour(s))  Comprehensive metabolic panel     Status: Abnormal   Collection Time: 07/21/16  8:29 PM  Result Value Ref Range   Sodium 140 135 - 145 mmol/L   Potassium 3.1 (L) 3.5 - 5.1 mmol/L   Chloride 102 101 - 111 mmol/L   CO2 27 22 - 32 mmol/L   Glucose, Bld 103 (H) 65 - 99 mg/dL   BUN 13 6 - 20 mg/dL   Creatinine, Ser 1.25 (H) 0.61 - 1.24 mg/dL   Calcium 9.3 8.9 - 10.3 mg/dL   Total Protein 7.4 6.5 - 8.1 g/dL   Albumin 4.2 3.5 - 5.0 g/dL   AST 24 15 - 41 U/L   ALT 29 17 - 63 U/L   Alkaline Phosphatase 71 38 - 126 U/L   Total Bilirubin 1.2 0.3 - 1.2 mg/dL   GFR calc non Af Amer 60 (L) >60 mL/min   GFR calc Af Amer >60 >  60 mL/min    Comment: (NOTE) The eGFR has been calculated using the CKD EPI equation. This calculation has not been validated in all clinical situations. eGFR's persistently <60 mL/min signify possible Chronic Kidney Disease.    Anion gap 11 5 - 15  CBC     Status: None   Collection Time: 07/21/16  8:29 PM  Result Value Ref Range   WBC 9.6 4.0 - 10.5 K/uL   RBC 4.81 4.22 - 5.81 MIL/uL   Hemoglobin 14.9 13.0 - 17.0 g/dL   HCT 43.5 39.0 - 52.0 %   MCV 90.4 78.0 - 100.0 fL   MCH 31.0 26.0 - 34.0 pg   MCHC 34.3 30.0 - 36.0 g/dL   RDW 13.1 11.5 - 15.5 %   Platelets 238 150 - 400 K/uL   No results found.  Review of Systems  HENT: Positive for sore throat.   Skin: Positive for itching and rash.    Blood pressure 147/97, pulse 110, temperature 99.2 F (37.3 C), temperature source Oral, resp. rate 18, height '6\' 2"'  (1.88 m), weight 130.2 kg (287 lb), SpO2 97 %. Physical Exam  Constitutional: He appears well-developed and well-nourished.  HENT:  Head: Normocephalic.  Eyes: Pupils are  equal, round, and reactive to light.  Neck: Normal range of motion.  GI: Soft.  Neurological: He is alert. He has normal strength. GCS eye subscore is 4. GCS verbal subscore is 5. GCS motor subscore is 6.  Is awake and alert pupils equal neck has some mild to moderate amount of local swelling around the incision but otherwise feel soft upper and lower extremities 5 out of 5 Strength  Skin: Skin is warm and dry.     Assessment/Plan 65 year old gentleman postoperative day 9 from an ACDF with some increased difficulty with swallowing. Denies any respiratory difficulties. No shortness of breath no difficulty catching his air. He has reported progressive worsening rash throughout his body both face arms chest. Was sent home on steroids initially after the surgery and has been getting worse since the steroids finished. We obtain soft tissue CT of his neck which does show a postoperative fluid collection without significant airway compromise. I recommended admission IV steroids observation and IV fluids.  Shanon Seawright P, MD 07/21/2016, 9:58 PM

## 2016-07-21 NOTE — Anesthesia Preprocedure Evaluation (Addendum)
Anesthesia Evaluation  Patient identified by MRN, date of birth, ID band Patient awake    Reviewed: Allergy & Precautions, H&P , NPO status , Patient's Chart, lab work & pertinent test results  Airway Mallampati: I  TM Distance: >3 FB Neck ROM: Full    Dental no notable dental hx. (+) Teeth Intact, Dental Advisory Given   Pulmonary neg pulmonary ROS,    Pulmonary exam normal breath sounds clear to auscultation       Cardiovascular hypertension, Pt. on medications  Rhythm:Regular Rate:Normal     Neuro/Psych negative neurological ROS  negative psych ROS   GI/Hepatic negative GI ROS, Neg liver ROS,   Endo/Other  Hypothyroidism   Renal/GU negative Renal ROS  negative genitourinary   Musculoskeletal  (+) Arthritis , Osteoarthritis,    Abdominal   Peds  Hematology negative hematology ROS (+)   Anesthesia Other Findings   Reproductive/Obstetrics negative OB ROS                            Anesthesia Physical Anesthesia Plan  ASA: II and emergent  Anesthesia Plan: General   Post-op Pain Management:    Induction: Intravenous  Airway Management Planned: Oral ETT  Additional Equipment:   Intra-op Plan:   Post-operative Plan: Extubation in OR  Informed Consent: I have reviewed the patients History and Physical, chart, labs and discussed the procedure including the risks, benefits and alternatives for the proposed anesthesia with the patient or authorized representative who has indicated his/her understanding and acceptance.   Dental advisory given  Plan Discussed with: CRNA  Anesthesia Plan Comments:         Anesthesia Quick Evaluation

## 2016-07-22 ENCOUNTER — Encounter (HOSPITAL_COMMUNITY): Payer: Self-pay | Admitting: Neurosurgery

## 2016-07-22 DIAGNOSIS — R21 Rash and other nonspecific skin eruption: Secondary | ICD-10-CM | POA: Diagnosis present

## 2016-07-22 DIAGNOSIS — E785 Hyperlipidemia, unspecified: Secondary | ICD-10-CM | POA: Diagnosis present

## 2016-07-22 DIAGNOSIS — Y838 Other surgical procedures as the cause of abnormal reaction of the patient, or of later complication, without mention of misadventure at the time of the procedure: Secondary | ICD-10-CM | POA: Diagnosis present

## 2016-07-22 DIAGNOSIS — I10 Essential (primary) hypertension: Secondary | ICD-10-CM | POA: Diagnosis present

## 2016-07-22 DIAGNOSIS — R1310 Dysphagia, unspecified: Secondary | ICD-10-CM | POA: Diagnosis present

## 2016-07-22 DIAGNOSIS — E039 Hypothyroidism, unspecified: Secondary | ICD-10-CM | POA: Diagnosis present

## 2016-07-22 DIAGNOSIS — L7634 Postprocedural seroma of skin and subcutaneous tissue following other procedure: Secondary | ICD-10-CM | POA: Diagnosis present

## 2016-07-22 LAB — MRSA PCR SCREENING: MRSA by PCR: POSITIVE — AB

## 2016-07-22 MED ORDER — LACTATED RINGERS IV SOLN
INTRAVENOUS | Status: DC | PRN
  Administered 2016-07-22: via INTRAVENOUS

## 2016-07-22 MED ORDER — HYDROMORPHONE HCL 1 MG/ML IJ SOLN
0.2500 mg | INTRAMUSCULAR | Status: DC | PRN
Start: 2016-07-22 — End: 2016-07-22

## 2016-07-22 MED ORDER — AMLODIPINE-ATORVASTATIN 10-10 MG PO TABS: 1.0000 | ORAL_TABLET | Freq: Every day | ORAL | Status: DC

## 2016-07-22 MED ORDER — SUCCINYLCHOLINE CHLORIDE 200 MG/10ML IV SOSY
PREFILLED_SYRINGE | INTRAVENOUS | Status: AC
  Filled 2016-07-22: qty 10

## 2016-07-22 MED ORDER — DOXYCYCLINE HYCLATE 100 MG PO TABS
100.0000 mg | ORAL_TABLET | Freq: Two times a day (BID) | ORAL | Status: DC
  Administered 2016-07-22 – 2016-07-23 (×3): 100 mg via ORAL
  Filled 2016-07-22 (×3): qty 1

## 2016-07-22 MED ORDER — ATORVASTATIN CALCIUM 10 MG PO TABS
10.0000 mg | ORAL_TABLET | Freq: Every day | ORAL | Status: DC
  Administered 2016-07-22: 10 mg via ORAL
  Filled 2016-07-22: qty 1

## 2016-07-22 MED ORDER — CEFAZOLIN SODIUM-DEXTROSE 2-4 GM/100ML-% IV SOLN
2.0000 g | Freq: Three times a day (TID) | INTRAVENOUS | Status: DC
  Administered 2016-07-22 – 2016-07-23 (×4): 2 g via INTRAVENOUS
  Filled 2016-07-22 (×5): qty 100

## 2016-07-22 MED ORDER — OXYCODONE-ACETAMINOPHEN 5-325 MG PO TABS: 1.0000 | ORAL_TABLET | ORAL | Status: DC | PRN

## 2016-07-22 MED ORDER — AMLODIPINE BESYLATE 10 MG PO TABS
10.0000 mg | ORAL_TABLET | Freq: Every day | ORAL | Status: DC
  Administered 2016-07-22 – 2016-07-23 (×2): 10 mg via ORAL
  Filled 2016-07-22 (×2): qty 1

## 2016-07-22 MED ORDER — ONDANSETRON HCL 4 MG/2ML IJ SOLN
INTRAMUSCULAR | Status: AC
  Filled 2016-07-22: qty 8

## 2016-07-22 MED ORDER — LEVOTHYROXINE SODIUM 112 MCG PO TABS
112.0000 ug | ORAL_TABLET | ORAL | Status: DC
  Administered 2016-07-22: 112 ug via ORAL
  Filled 2016-07-22: qty 1

## 2016-07-22 MED ORDER — HYDROMORPHONE HCL 1 MG/ML IJ SOLN: 0.5000 mg | INTRAMUSCULAR | Status: DC | PRN

## 2016-07-22 MED ORDER — PANTOPRAZOLE SODIUM 40 MG IV SOLR
40.0000 mg | Freq: Two times a day (BID) | INTRAVENOUS | Status: DC
  Administered 2016-07-22 (×3): 40 mg via INTRAVENOUS
  Filled 2016-07-22 (×4): qty 40

## 2016-07-22 MED ORDER — 0.9 % SODIUM CHLORIDE (POUR BTL) OPTIME
TOPICAL | Status: DC | PRN
  Administered 2016-07-22: 1000 mL

## 2016-07-22 MED ORDER — SODIUM CHLORIDE 0.9% FLUSH: 3.0000 mL | INTRAVENOUS | Status: DC | PRN

## 2016-07-22 MED ORDER — ROCURONIUM BROMIDE 50 MG/5ML IV SOSY
PREFILLED_SYRINGE | INTRAVENOUS | Status: AC
  Filled 2016-07-22: qty 5

## 2016-07-22 MED ORDER — MENTHOL 3 MG MT LOZG: 1.0000 | LOZENGE | OROMUCOSAL | Status: DC | PRN

## 2016-07-22 MED ORDER — WHITE PETROLATUM GEL
Status: AC
  Administered 2016-07-22: 06:00:00
  Filled 2016-07-22: qty 1

## 2016-07-22 MED ORDER — QUINAPRIL HCL 10 MG PO TABS
40.0000 mg | ORAL_TABLET | Freq: Every day | ORAL | Status: DC
  Administered 2016-07-22 (×2): 40 mg via ORAL
  Filled 2016-07-22 (×2): qty 4

## 2016-07-22 MED ORDER — CYCLOBENZAPRINE HCL 10 MG PO TABS: 10.0000 mg | ORAL_TABLET | Freq: Three times a day (TID) | ORAL | Status: DC | PRN

## 2016-07-22 MED ORDER — ONDANSETRON HCL 4 MG/2ML IJ SOLN: 4.0000 mg | INTRAMUSCULAR | Status: DC | PRN

## 2016-07-22 MED ORDER — SODIUM CHLORIDE 0.9% FLUSH
3.0000 mL | Freq: Two times a day (BID) | INTRAVENOUS | Status: DC
  Administered 2016-07-22: 3 mL via INTRAVENOUS

## 2016-07-22 MED ORDER — SPIRONOLACTONE 25 MG PO TABS
50.0000 mg | ORAL_TABLET | Freq: Every day | ORAL | Status: DC
  Administered 2016-07-22 – 2016-07-23 (×2): 50 mg via ORAL
  Filled 2016-07-22 (×2): qty 2

## 2016-07-22 MED ORDER — DEXAMETHASONE SODIUM PHOSPHATE 10 MG/ML IJ SOLN
10.0000 mg | Freq: Four times a day (QID) | INTRAMUSCULAR | Status: DC
  Administered 2016-07-22 – 2016-07-23 (×6): 10 mg via INTRAVENOUS
  Filled 2016-07-22 (×5): qty 1

## 2016-07-22 MED ORDER — ONDANSETRON HCL 4 MG/2ML IJ SOLN
INTRAMUSCULAR | Status: DC | PRN
  Administered 2016-07-22: 4 mg via INTRAVENOUS

## 2016-07-22 MED ORDER — ADULT MULTIVITAMIN W/MINERALS CH
1.0000 | ORAL_TABLET | Freq: Every day | ORAL | Status: DC
  Administered 2016-07-23: 1 via ORAL
  Filled 2016-07-22 (×2): qty 1

## 2016-07-22 MED ORDER — POTASSIUM CHLORIDE IN NACL 20-0.9 MEQ/L-% IV SOLN
INTRAVENOUS | Status: DC
  Administered 2016-07-22: 02:00:00 via INTRAVENOUS
  Filled 2016-07-22 (×2): qty 1000

## 2016-07-22 MED ORDER — SODIUM CHLORIDE 0.9 % IR SOLN
Status: DC | PRN
  Administered 2016-07-22: 500 mL

## 2016-07-22 MED ORDER — DEXAMETHASONE SODIUM PHOSPHATE 10 MG/ML IJ SOLN
INTRAMUSCULAR | Status: AC
  Filled 2016-07-22: qty 1

## 2016-07-22 MED ORDER — ACETAMINOPHEN 325 MG PO TABS: 650.0000 mg | ORAL_TABLET | ORAL | Status: DC | PRN

## 2016-07-22 MED ORDER — CHLORHEXIDINE GLUCONATE CLOTH 2 % EX PADS
6.0000 | MEDICATED_PAD | Freq: Every day | CUTANEOUS | Status: DC
  Administered 2016-07-22 – 2016-07-23 (×2): 6 via TOPICAL

## 2016-07-22 MED ORDER — SODIUM CHLORIDE 0.9 % IV SOLN: 250.0000 mL | INTRAVENOUS | Status: DC

## 2016-07-22 MED ORDER — POTASSIUM CHLORIDE CRYS ER 20 MEQ PO TBCR
40.0000 meq | EXTENDED_RELEASE_TABLET | Freq: Two times a day (BID) | ORAL | Status: DC
  Administered 2016-07-22 – 2016-07-23 (×4): 40 meq via ORAL
  Filled 2016-07-22 (×4): qty 2

## 2016-07-22 MED ORDER — SUGAMMADEX SODIUM 500 MG/5ML IV SOLN
INTRAVENOUS | Status: DC | PRN
  Administered 2016-07-22: 500 mg via INTRAVENOUS

## 2016-07-22 MED ORDER — SUGAMMADEX SODIUM 500 MG/5ML IV SOLN
INTRAVENOUS | Status: AC
  Filled 2016-07-22: qty 5

## 2016-07-22 MED ORDER — ACETAMINOPHEN 650 MG RE SUPP: 650.0000 mg | RECTAL | Status: DC | PRN

## 2016-07-22 MED ORDER — PHENYLEPHRINE HCL 10 MG/ML IJ SOLN
INTRAMUSCULAR | Status: DC | PRN
  Administered 2016-07-22 (×6): 80 ug via INTRAVENOUS

## 2016-07-22 MED ORDER — MUPIROCIN 2 % EX OINT
1.0000 "application " | TOPICAL_OINTMENT | Freq: Two times a day (BID) | CUTANEOUS | Status: DC
  Administered 2016-07-22 – 2016-07-23 (×3): 1 via NASAL
  Filled 2016-07-22: qty 22

## 2016-07-22 MED ORDER — SURGIFOAM 100 EX MISC
CUTANEOUS | Status: DC | PRN
  Administered 2016-07-22: 20 mL via TOPICAL

## 2016-07-22 MED ORDER — PHENYLEPHRINE 40 MCG/ML (10ML) SYRINGE FOR IV PUSH (FOR BLOOD PRESSURE SUPPORT)
PREFILLED_SYRINGE | INTRAVENOUS | Status: AC
  Filled 2016-07-22: qty 20

## 2016-07-22 MED ORDER — PHENOL 1.4 % MT LIQD: 1.0000 | OROMUCOSAL | Status: DC | PRN

## 2016-07-22 NOTE — Anesthesia Postprocedure Evaluation (Signed)
Anesthesia Post Note  Patient: Thermon Leyland Sitar  Procedure(s) Performed: Procedure(s) (LRB): RE-EXPLORATION OF ANTERIOR CERVICAL WOUND AND EVACUATION OF CERVICAL HEMATOMA (N/A)  Patient location during evaluation: PACU Anesthesia Type: General Level of consciousness: awake and alert Pain management: pain level controlled Vital Signs Assessment: post-procedure vital signs reviewed and stable Respiratory status: spontaneous breathing, nonlabored ventilation, respiratory function stable and patient connected to nasal cannula oxygen Cardiovascular status: blood pressure returned to baseline and stable Postop Assessment: no signs of nausea or vomiting Anesthetic complications: no       Last Vitals:  Vitals:   07/22/16 0130 07/22/16 0152  BP: (!) 135/92 (!) 143/86  Pulse: 93 99  Resp: 13 13  Temp: 36.7 C 36.8 C    Last Pain:  Vitals:   07/22/16 0152  TempSrc: Oral  PainSc:                  Barbara Keng,W. EDMOND

## 2016-07-22 NOTE — Progress Notes (Signed)
Subjective: Patient reports Better significant improvement in swallowing and soreness in his neck  Objective: Vital signs in last 24 hours: Temp:  [98 F (36.7 C)-99.2 F (37.3 C)] 98.7 F (37.1 C) (12/23 0340) Pulse Rate:  [93-110] 103 (12/23 0600) Resp:  [11-18] 11 (12/23 0600) BP: (135-150)/(85-97) 140/97 (12/23 0400) SpO2:  [92 %-97 %] 94 % (12/23 0600) Weight:  [126.3 kg (278 lb 7.1 oz)-130.2 kg (287 lb)] 126.3 kg (278 lb 7.1 oz) (12/23 0152)  Intake/Output from previous day: 12/22 0701 - 12/23 0700 In: 2674.7 [P.O.:520; I.V.:2054.7; IV Piggyback:100] Out: 810 [Urine:800; Blood:10] Intake/Output this shift: No intake/output data recorded.  Awake alert incision flat drain with minimal output  Lab Results:  Recent Labs  07/21/16 2029  WBC 9.6  HGB 14.9  HCT 43.5  PLT 238   BMET  Recent Labs  07/21/16 2029  NA 140  K 3.1*  CL 102  CO2 27  GLUCOSE 103*  BUN 13  CREATININE 1.25*  CALCIUM 9.3    Studies/Results: Ct Soft Tissue Neck W Contrast  Result Date: 07/21/2016 CLINICAL DATA:  ACDF 07/11/2016. Difficulty swallowing. Globus sensation. EXAM: CT NECK WITH CONTRAST TECHNIQUE: Multidetector CT imaging of the neck was performed using the standard protocol following the bolus administration of intravenous contrast. CONTRAST:  53mL ISOVUE-300 IOPAMIDOL (ISOVUE-300) INJECTION 61% COMPARISON:  Cervical spine radiograph 07/20/2016 FINDINGS: Pharynx and larynx: There is a retropharyngeal fluid collection the level of the hyoid bone, extending from the C3 to the C5 level predominantly. The collection measures approximately 4.2 cm transverse by 1.8 cm AP by 5.0 cm craniocaudal. A component of the collection extends superiorly anterior to C2 vertebral body. There is mass effect on the airway, with the airway narrowed to 4 mm at the level of the epiglottis. In the subcutaneous fat and skin of the left neck, there are sequelae of recent anterior approach to the cervical  spine. The nasopharynx is clear. The parapharyngeal spaces are preserved. Salivary glands: The parotid and submandibular glands are normal. No sialolithiasis or salivary ductal dilatation. Thyroid: Normal Lymph nodes: No enlarged or abnormal density cervical lymph nodes. Vascular: The major cervical vessels are normal. Limited intracranial: Normal Mastoids and visualized paranasal sinuses: Clear Skeleton: ACDF hardware extends from C2-C7.  No abnormal lucency. Upper chest: Clear Other: None IMPRESSION: 1. Large retropharyngeal fluid collection, anterior to the cervical fusion, measuring 4.2 x 1.8 x 5.0 cm. There is mass effect on the adjacent airway, narrowing the airway lumen to 4 mm at the level of the epiglottis. 2. In the setting of recent surgery, this is favored to represent a postoperative fluid collection, including the possibility of CSF leak. There are no specific features of infection. These results were discussed in person at the time of interpretation on 07/21/2016 at 10:25 pm with Dr. Kary Kos by Dr. Genia Del. Electronically Signed   By: Ulyses Jarred M.D.   On: 07/21/2016 22:27    Assessment/Plan: Continue observing the ICU this morning mobilize patient to be discharged home later on this afternoon if he feels like his swallowing is enough improvement that he wants to go home however if not we'll keep him another day on steroids and discharge tomorrow  LOS: 0 days     Elexius Minar P 07/22/2016, 7:28 AM

## 2016-07-22 NOTE — Op Note (Signed)
Preoperative diagnosis: Dysphagia anterior cervical wound hematoma  Postoperative diagnosis: Same possible CSF leak  Procedure: Reexploration of anterior cervical wound for evacuation of anterior cervical fluid collection seromatous or possibly consistent with a previous cerebral spinal fluid leak  Surgeon: Dominica Severin Ciel Yanes  Anesthesia: Gen.  EBL: Mental  History of present illness: Patient is a 65 year old gentleman who 9 days ago underwent an anterior cervical discectomy and fusion at C3-4 did very well initially however over last several days had progressive worsening swallowing difficulty never had knee difficulty breathing presented with swelling in his neck and swallowing difficulty. Workup with CT scan showed a fluid collection causing airway and esophageal compression. So therefore the patient was recommended reexploration of anterior cervical wound for evacuation of the fluid collection. I extensively reviewed the risks and benefits of the operation the patient as well as perioperative course expectations of outcome and alternatives of surgery and he understood and agreed to proceed forward.  Operative procedure: Left-sided patient's neck was prepped and draped in routine sterile fashion. His old incision was opened up and and the scar tissues dissected free the tissues was freed up and the plane medial to the carotid was developed down to the prevertebral fascia and the anterior cervical plate. Once I developed a plane very clear thin fluid was immediately identified and aspirated and was consistent with CSF or possibly seroma although it was very clear and very thin. After aspirated all around the plate and debrided some devitalized tissue that was anterior to the plate I did not see any spinal fluid coming up from around the graft site. Maintain meticulous hemostasis around the dissection cavity I elected to squirt the fibrin glue material around underneath the plate and around the bone graft just  in case it was spinal fluid. Further maintain meticulous hemostasis but due possibility that this was also hematoma I elected to place a J-P drain not overlying the plate that overlying the dissection cavity 1 layer superficial from the plate along the medial border of the carotid and the longus. I then closed the wound in layers with interrupted Vicryl in the platysma and a running 4 subcuticular Dermabond benzo and Steri-Strips and a sterile dressing was applied and patient recovered in stable condition. At the end the case all needle counts and sponge counts were correct.

## 2016-07-22 NOTE — Progress Notes (Signed)
During assessment, pt's JP drain to the right neck had been pulled out. Pt's dressing was still intact and c-collar in place. Pt unaware of when or how long the drain had been out. No complications noted. Dr. Ronnald Ramp paged and made aware. No further interventions or orders from MD at this time. Will continue to monitor and assess site .

## 2016-07-22 NOTE — Anesthesia Procedure Notes (Signed)
Procedure Name: Intubation Date/Time: 07/22/2016 11:46 PM Performed by: Manuela Schwartz B Pre-anesthesia Checklist: Patient identified, Emergency Drugs available, Suction available, Patient being monitored and Timeout performed Patient Re-evaluated:Patient Re-evaluated prior to inductionOxygen Delivery Method: Circle system utilized Preoxygenation: Pre-oxygenation with 100% oxygen Intubation Type: IV induction and Rapid sequence Grade View: Grade I Tube type: Oral Tube size: 7.0 mm Number of attempts: 1 Airway Equipment and Method: Stylet and Video-laryngoscopy (elective glidescope intubation) Placement Confirmation: ETT inserted through vocal cords under direct vision,  positive ETCO2 and breath sounds checked- equal and bilateral Secured at: 22 cm Tube secured with: Tape Dental Injury: Teeth and Oropharynx as per pre-operative assessment

## 2016-07-22 NOTE — Transfer of Care (Signed)
Immediate Anesthesia Transfer of Care Note  Patient: Carlos Foster  Procedure(s) Performed: Procedure(s): RE-EXPLORATION OF ANTERIOR CERVICAL WOUND AND EVACUATION OF CERVICAL HEMATOMA (N/A)  Patient Location: PACU  Anesthesia Type:General  Level of Consciousness: awake, alert  and oriented  Airway & Oxygen Therapy: Patient Spontanous Breathing and Patient connected to nasal cannula oxygen  Post-op Assessment: Report given to RN and Post -op Vital signs reviewed and stable  Post vital signs: Reviewed and stable  Last Vitals:  Vitals:   07/21/16 2300 07/22/16 0106  BP: 150/92 139/85  Pulse: 105 99  Resp: 18 18  Temp:  36.7 C    Last Pain:  Vitals:   07/21/16 2024  TempSrc: Oral         Complications: No apparent anesthesia complications

## 2016-07-22 NOTE — Progress Notes (Signed)
Orthopedic Tech Progress Note Patient Details:  Carlos Foster 1951-02-04 [id number]  Ortho Devices Type of Ortho Device: Soft collar Ortho Device/Splint Interventions: Ordered, Application   [name] 07/22/2016, 1:39 AM

## 2016-07-23 MED ORDER — DEXAMETHASONE 4 MG PO TABS: 4.0000 mg | ORAL_TABLET | Freq: Two times a day (BID) | ORAL | 0 refills | Status: AC

## 2016-07-23 NOTE — Progress Notes (Signed)
Subjective: Patient reports doing well.  Strength full and swallowing much improved.  Objective: Vital signs in last 24 hours: Temp:  [97.7 F (36.5 C)-98.5 F (36.9 C)] 97.8 F (36.6 C) (12/24 0800) Pulse Rate:  [86-111] 93 (12/24 0800) Resp:  [0-26] 12 (12/24 0800) BP: (121-160)/(75-99) 155/81 (12/24 0800) SpO2:  [89 %-97 %] 97 % (12/24 0800)  Intake/Output from previous day: 12/23 0701 - 12/24 0700 In: 1880 [P.O.:1120; I.V.:560; IV Piggyback:200] Out: 1725 [Urine:1725] Intake/Output this shift: No intake/output data recorded.  Physical Exam: Wound soft, speech strong, strength full bilateral upper and lower extremities.  Lab Results:  Recent Labs  07/21/16 2029  WBC 9.6  HGB 14.9  HCT 43.5  PLT 238   BMET  Recent Labs  07/21/16 2029  NA 140  K 3.1*  CL 102  CO2 27  GLUCOSE 103*  BUN 13  CREATININE 1.25*  CALCIUM 9.3    Studies/Results: Ct Soft Tissue Neck W Contrast  Result Date: 07/21/2016 CLINICAL DATA:  ACDF 07/11/2016. Difficulty swallowing. Globus sensation. EXAM: CT NECK WITH CONTRAST TECHNIQUE: Multidetector CT imaging of the neck was performed using the standard protocol following the bolus administration of intravenous contrast. CONTRAST:  60mL ISOVUE-300 IOPAMIDOL (ISOVUE-300) INJECTION 61% COMPARISON:  Cervical spine radiograph 07/20/2016 FINDINGS: Pharynx and larynx: There is a retropharyngeal fluid collection the level of the hyoid bone, extending from the C3 to the C5 level predominantly. The collection measures approximately 4.2 cm transverse by 1.8 cm AP by 5.0 cm craniocaudal. A component of the collection extends superiorly anterior to C2 vertebral body. There is mass effect on the airway, with the airway narrowed to 4 mm at the level of the epiglottis. In the subcutaneous fat and skin of the left neck, there are sequelae of recent anterior approach to the cervical spine. The nasopharynx is clear. The parapharyngeal spaces are preserved.  Salivary glands: The parotid and submandibular glands are normal. No sialolithiasis or salivary ductal dilatation. Thyroid: Normal Lymph nodes: No enlarged or abnormal density cervical lymph nodes. Vascular: The major cervical vessels are normal. Limited intracranial: Normal Mastoids and visualized paranasal sinuses: Clear Skeleton: ACDF hardware extends from C2-C7.  No abnormal lucency. Upper chest: Clear Other: None IMPRESSION: 1. Large retropharyngeal fluid collection, anterior to the cervical fusion, measuring 4.2 x 1.8 x 5.0 cm. There is mass effect on the adjacent airway, narrowing the airway lumen to 4 mm at the level of the epiglottis. 2. In the setting of recent surgery, this is favored to represent a postoperative fluid collection, including the possibility of CSF leak. There are no specific features of infection. These results were discussed in person at the time of interpretation on 07/21/2016 at 10:25 pm with Dr. Kary Kos by Dr. Genia Del. Electronically Signed   By: Ulyses Jarred M.D.   On: 07/21/2016 22:27    Assessment/Plan: Doing well.  Discharge home.    LOS: 1 day    Joeanne Robicheaux D, MD 07/23/2016, 8:30 AM

## 2016-07-23 NOTE — Discharge Summary (Signed)
Physician Discharge Summary  Patient ID: Carlos Foster MRN: [id number] DOB/AGE: 63-Jan-1952 63 y.o.  Admit date: 07/21/2016 Discharge date: 07/23/2016  Admission Diagnoses:Dyspagia after anterior cervical discectomy and fusion surgery  Discharge Diagnoses: Same Active Problems:   Dysphagia   Discharged Condition: good  Hospital Course: Patient was readmitted after ACDF surgery for dysphagia and was taken to surgery for wound exploration and evacuation of hematoma, fo  Consults: None  Significant Diagnostic Studies: None  Treatments: surgery: Wound exploration and evacuation of fluid collection (possibly seroma or CSF leak).  Discharge Exam: Blood pressure (!) 155/81, pulse 93, temperature 97.8 F (36.6 C), temperature source Oral, resp. rate 12, height 6\' 2"  (1.88 m), weight 126.3 kg (278 lb 7.1 oz), SpO2 97 %. Neurologic: Alert and oriented X 3, normal strength and tone. Normal symmetric reflexes. Normal coordination and gait Wound:CDI  Disposition: Home   Allergies as of 07/23/2016   No Known Allergies     Medication List    TAKE these medications   amlodipine-atorvastatin 10-10 MG tablet Commonly known as:  CADUET Take 1 tablet by mouth daily.   dexamethasone 4 MG tablet Commonly known as:  DECADRON Take 1 tablet (4 mg total) by mouth 2 (two) times daily.   doxycycline 100 MG EC tablet Commonly known as:  DORYX Take 100 mg by mouth 2 (two) times daily as needed. For rosacea   levothyroxine 112 MCG tablet Commonly known as:  SYNTHROID, LEVOTHROID Take 1 tablet by mouth  daily except for 1 and 1/2  tablets by mouth on Sunday   metroNIDAZOLE 1 % gel Commonly known as:  METROGEL Apply 1 application  topically daily as needed.   multivitamin tablet Take 1 tablet by mouth daily.   potassium chloride SA 20 MEQ tablet Commonly known as:  K-DUR,KLOR-CON Take 2 tablets by mouth two times daily   predniSONE 20 MG tablet Commonly known as:  DELTASONE 2 a day  for 4 days, then 1 a day for 4 days, then 0.5 a day for 4 days. With food.   quinapril 40 MG tablet Commonly known as:  ACCUPRIL Take 1 tablet by mouth at  bedtime   spironolactone 25 MG tablet Commonly known as:  ALDACTONE Take 2 tablets by mouth  daily   vitamin B-12 100 MCG tablet Commonly known as:  CYANOCOBALAMIN Take 100 mcg by mouth daily.        Signed: Peggyann Shoals, MD 07/23/2016, 8:32 AM

## 2016-07-23 NOTE — Progress Notes (Signed)
Discharge instructions, RX's and follow up appts explained and provided to patient and family verbalized understanding. Patient left floor via wheelchair accompanied by staff no c/o pain or shortness of breath at d/c.  Dilon Lank Lynn, RN  

## 2016-07-26 ENCOUNTER — Encounter (HOSPITAL_COMMUNITY): Payer: Self-pay | Admitting: Neurosurgery

## 2016-08-28 ENCOUNTER — Other Ambulatory Visit: Payer: Self-pay | Admitting: Otolaryngology

## 2016-08-28 DIAGNOSIS — Z8739 Personal history of other diseases of the musculoskeletal system and connective tissue: Secondary | ICD-10-CM

## 2016-08-28 DIAGNOSIS — R1314 Dysphagia, pharyngoesophageal phase: Secondary | ICD-10-CM

## 2016-08-30 ENCOUNTER — Ambulatory Visit
Admission: RE | Admit: 2016-08-30 | Discharge: 2016-08-30 | Disposition: A | Discharge status: Home / Self Care | Payer: BLUE CROSS/BLUE SHIELD | Source: Ambulatory Visit | Attending: Otolaryngology | Admitting: Otolaryngology

## 2016-08-30 DIAGNOSIS — Z8739 Personal history of other diseases of the musculoskeletal system and connective tissue: Secondary | ICD-10-CM

## 2016-08-30 DIAGNOSIS — R1314 Dysphagia, pharyngoesophageal phase: Secondary | ICD-10-CM

## 2016-09-18 ENCOUNTER — Other Ambulatory Visit (INDEPENDENT_AMBULATORY_CARE_PROVIDER_SITE_OTHER): Payer: BLUE CROSS/BLUE SHIELD

## 2016-09-18 ENCOUNTER — Encounter (INDEPENDENT_AMBULATORY_CARE_PROVIDER_SITE_OTHER): Payer: Self-pay

## 2016-09-18 ENCOUNTER — Other Ambulatory Visit: Payer: Self-pay | Admitting: Family Medicine

## 2016-09-18 ENCOUNTER — Telehealth: Payer: Self-pay | Admitting: Radiology

## 2016-09-18 DIAGNOSIS — I10 Essential (primary) hypertension: Secondary | ICD-10-CM

## 2016-09-18 LAB — LIPID PANEL
Cholesterol: 205 mg/dL — ABNORMAL HIGH (ref 0–200)
HDL: 34.6 mg/dL — ABNORMAL LOW (ref >39.00)
LDL Cholesterol: 141 mg/dL — ABNORMAL HIGH (ref 0–99)
NonHDL: 170.34
Total CHOL/HDL Ratio: 6
Triglycerides: 147 mg/dL (ref 0.0–149.0)
VLDL: 29.4 mg/dL (ref 0.0–40.0)

## 2016-09-18 LAB — COMPREHENSIVE METABOLIC PANEL
ALT: 51 U/L (ref 0–53)
AST: 42 U/L — ABNORMAL HIGH (ref 0–37)
Albumin: 3.9 g/dL (ref 3.5–5.2)
Alkaline Phosphatase: 61 U/L (ref 39–117)
BUN: 11 mg/dL (ref 6–23)
CO2: 34 mEq/L — ABNORMAL HIGH (ref 19–32)
Calcium: 8.5 mg/dL (ref 8.4–10.5)
Chloride: 102 mEq/L (ref 96–112)
Creatinine, Ser: 0.98 mg/dL (ref 0.40–1.50)
GFR: 81.92 mL/min (ref >60.00)
Glucose, Bld: 109 mg/dL — ABNORMAL HIGH (ref 70–99)
Potassium: 2.3 mEq/L — CL (ref 3.5–5.1)
Sodium: 146 mEq/L — ABNORMAL HIGH (ref 135–145)
Total Bilirubin: 0.7 mg/dL (ref 0.2–1.2)
Total Protein: 6.2 g/dL (ref 6.0–8.3)

## 2016-09-18 LAB — TSH: TSH: 3.06 u[IU]/mL (ref 0.35–4.50)

## 2016-09-18 NOTE — Telephone Encounter (Signed)
[facility] called critical K+ 2.3, results given to Dr Damita Dunnings

## 2016-09-18 NOTE — Telephone Encounter (Signed)
See result note.  

## 2016-09-19 ENCOUNTER — Other Ambulatory Visit: Payer: Self-pay | Admitting: Family Medicine

## 2016-09-19 DIAGNOSIS — E876 Hypokalemia: Secondary | ICD-10-CM

## 2016-09-21 ENCOUNTER — Encounter: Payer: BLUE CROSS/BLUE SHIELD | Admitting: Family Medicine

## 2016-09-26 ENCOUNTER — Ambulatory Visit (INDEPENDENT_AMBULATORY_CARE_PROVIDER_SITE_OTHER): Payer: BLUE CROSS/BLUE SHIELD | Admitting: Family Medicine

## 2016-09-26 ENCOUNTER — Encounter: Payer: Self-pay | Admitting: Family Medicine

## 2016-09-26 VITALS — BP 164/98 | HR 84 | Temp 98.4°F | Ht 73.0 in | Wt 273.8 lb

## 2016-09-26 DIAGNOSIS — E785 Hyperlipidemia, unspecified: Secondary | ICD-10-CM

## 2016-09-26 DIAGNOSIS — E876 Hypokalemia: Secondary | ICD-10-CM

## 2016-09-26 DIAGNOSIS — E038 Other specified hypothyroidism: Secondary | ICD-10-CM

## 2016-09-26 DIAGNOSIS — Z0000 Encounter for general adult medical examination without abnormal findings: Secondary | ICD-10-CM | POA: Diagnosis not present

## 2016-09-26 DIAGNOSIS — R202 Paresthesia of skin: Secondary | ICD-10-CM

## 2016-09-26 DIAGNOSIS — Z8249 Family history of ischemic heart disease and other diseases of the circulatory system: Secondary | ICD-10-CM

## 2016-09-26 DIAGNOSIS — I10 Essential (primary) hypertension: Secondary | ICD-10-CM

## 2016-09-26 MED ORDER — SPIRONOLACTONE 25 MG PO TABS: ORAL_TABLET | ORAL | Status: DC

## 2016-09-26 NOTE — Patient Instructions (Addendum)
Don't change your potassium for now but restart spironolactone 25mg  a day.  Go to the lab on the way out.  We'll contact you with your lab report. We'll go form there.   Likely will have to go back down on potassium dose later on, with concurrent increase in spironolactone.  Take care.  Glad to see you.

## 2016-09-26 NOTE — Progress Notes (Signed)
Pre visit review using our clinic review tool, if applicable. No additional management support is needed unless otherwise documented below in the visit note. 

## 2016-09-26 NOTE — Progress Notes (Signed)
CPE- See plan.  Routine anticipatory guidance given to patient.  See health maintenance. Flu up to date  Tetanus 2015  PNA due at 60  Shingles d/w pt, defer for now.   Colonoscopy 2011.  Noted prev with PSA elevation, it has resolved after abx treatment, he had f/u with urology in the past. He has plans for a f/u PSA work physical later this year.  D/w pt.  I'll defer for now.  He agrees.   Living will d/w pt. Wife designated if patient were incapacitated.  Diet and exercise d/w pt, both are affected by recent surgery. He is doing the best he can. Discussed with patient. FH AAA, d/w pt about screening.    Wife is in the ER currently with colitis.  I offered my condolences. He is going to leave the office and go check on her.  He had surgery with swallowing troubles after neck surgery.  He had to have re-exploration.  Swallowing is clearly better in the meantime.  He is on nexium.  His left arm symptoms are slowly improving. The left arm symptoms led to the initial neck surgery.  He was having lower BP and had to stop BP meds previously.  H/o low K noted.  Off spironolactone and ACE.  Still on K, 3 tabs BID. H/o sig low K in patient and family.  Off CCB and statin.  Lipids are up, since he has been off statin. All discussed with patient. See plan.  PMH and SH reviewed  Meds, vitals, and allergies reviewed.   ROS: Per HPI.  Unless specifically indicated otherwise in HPI, the patient denies:  General: fever. Eyes: acute vision changes ENT: sore throat Cardiovascular: chest pain Respiratory: SOB GI: vomiting GU: dysuria Musculoskeletal: acute back pain Derm: acute rash Neuro: acute motor dysfunction Psych: worsening mood Endocrine: polydipsia Heme: bleeding Allergy: hayfever  GEN: nad, alert and oriented HEENT: mucous membranes moist NECK: supple w/o LA CV: rrr. PULM: ctab, no inc wob ABD: soft, +bs EXT: no edema SKIN: no acute rash

## 2016-09-27 ENCOUNTER — Telehealth: Payer: Self-pay | Admitting: *Deleted

## 2016-09-27 DIAGNOSIS — E876 Hypokalemia: Secondary | ICD-10-CM

## 2016-09-27 LAB — BASIC METABOLIC PANEL
BUN: 13 mg/dL (ref 6–23)
CO2: 33 mEq/L — ABNORMAL HIGH (ref 19–32)
Calcium: 9 mg/dL (ref 8.4–10.5)
Chloride: 103 mEq/L (ref 96–112)
Creatinine, Ser: 1.05 mg/dL (ref 0.40–1.50)
GFR: 75.64 mL/min (ref >60.00)
Glucose, Bld: 79 mg/dL (ref 70–99)
Potassium: 2.8 mEq/L — CL (ref 3.5–5.1)
Sodium: 146 mEq/L — ABNORMAL HIGH (ref 135–145)

## 2016-09-27 NOTE — Assessment & Plan Note (Signed)
Flu up to date  Tetanus 2015  PNA due at 48  Shingles d/w pt, defer for now.   Colonoscopy 2011.  Noted prev with PSA elevation, it has resolved after abx treatment, he had f/u with urology in the past. He has plans for a f/u PSA work physical later this year.  D/w pt.  I'll defer for now.  He agrees.   Living will d/w pt. Wife designated if patient were incapacitated.  Diet and exercise d/w pt, both are affected by recent surgery. He is doing the best he can. Discussed with patient. FH AAA, d/w pt about screening.

## 2016-09-27 NOTE — Telephone Encounter (Signed)
See result note.  Thanks!

## 2016-09-27 NOTE — Assessment & Plan Note (Signed)
Off statin. Lipids up as expected. We need to dress his blood pressure and potassium first. Defer statin for now. We can add that back on later. He agrees.

## 2016-09-27 NOTE — Assessment & Plan Note (Signed)
See potassium discussion. He agrees.

## 2016-09-27 NOTE — Assessment & Plan Note (Signed)
He very likely has a genetic predisposition towards potassium wasting. Recheck labs today. Restart spironolactone 25 mg a day. He will likely need to increase this in the future. We will make plans when I see his labs. He agrees with all of that. We can add ACE inhibitor back later. We will likely need to adjust his potassium supplementation as we go along. All discussed with patient. He agrees.

## 2016-09-27 NOTE — Assessment & Plan Note (Signed)
TSH within normal limits. Continue as is. Discussed with patient. He agrees.

## 2016-09-27 NOTE — Assessment & Plan Note (Signed)
His left arm is slowly improving. He unfortunately had to have reexploration of his neck but is improved in the meantime.

## 2016-09-27 NOTE — Telephone Encounter (Signed)
CRITICAL VALUE STICKER  CRITICAL VALUE: Potassium 2.8, Potassium 2.3 was on 09/18/16  RECEIVER: Daralene Milch   DATE & TIME NOTIFIED: 09/27/16 1:19  MESSENGER Darolyn Rua  MD NOTIFIED:  Elsie Stain MD  TIME OF NOTIFICATION: 1:24  RESPONSE:

## 2016-10-04 ENCOUNTER — Other Ambulatory Visit (INDEPENDENT_AMBULATORY_CARE_PROVIDER_SITE_OTHER): Payer: BLUE CROSS/BLUE SHIELD

## 2016-10-04 DIAGNOSIS — E876 Hypokalemia: Secondary | ICD-10-CM

## 2016-10-04 LAB — BASIC METABOLIC PANEL
BUN: 10 mg/dL (ref 6–23)
CO2: 35 mEq/L — ABNORMAL HIGH (ref 19–32)
Calcium: 9.3 mg/dL (ref 8.4–10.5)
Chloride: 103 mEq/L (ref 96–112)
Creatinine, Ser: 1.04 mg/dL (ref 0.40–1.50)
GFR: 76.48 mL/min (ref >60.00)
Glucose, Bld: 114 mg/dL — ABNORMAL HIGH (ref 70–99)
Potassium: 3.1 mEq/L — ABNORMAL LOW (ref 3.5–5.1)
Sodium: 146 mEq/L — ABNORMAL HIGH (ref 135–145)

## 2016-10-05 ENCOUNTER — Other Ambulatory Visit: Payer: Self-pay | Admitting: Family Medicine

## 2016-10-05 DIAGNOSIS — I10 Essential (primary) hypertension: Secondary | ICD-10-CM

## 2016-10-05 MED ORDER — SPIRONOLACTONE 25 MG PO TABS: ORAL_TABLET | ORAL | Status: DC

## 2016-10-12 ENCOUNTER — Telehealth: Payer: Self-pay | Admitting: Family Medicine

## 2016-10-12 ENCOUNTER — Other Ambulatory Visit (INDEPENDENT_AMBULATORY_CARE_PROVIDER_SITE_OTHER): Payer: BLUE CROSS/BLUE SHIELD

## 2016-10-12 DIAGNOSIS — I10 Essential (primary) hypertension: Secondary | ICD-10-CM | POA: Diagnosis not present

## 2016-10-12 LAB — BASIC METABOLIC PANEL
BUN: 13 mg/dL (ref 6–23)
CO2: 32 mEq/L (ref 19–32)
Calcium: 9.6 mg/dL (ref 8.4–10.5)
Chloride: 104 mEq/L (ref 96–112)
Creatinine, Ser: 1.09 mg/dL (ref 0.40–1.50)
GFR: 72.44 mL/min (ref >60.00)
Glucose, Bld: 140 mg/dL — ABNORMAL HIGH (ref 70–99)
Potassium: 3 mEq/L — ABNORMAL LOW (ref 3.5–5.1)
Sodium: 144 mEq/L (ref 135–145)

## 2016-10-12 NOTE — Telephone Encounter (Signed)
Pt came in for labs today and BP readings as requested.  146/92- 3/15 a.m. 160/100- 3/14 am & pm

## 2016-10-13 ENCOUNTER — Other Ambulatory Visit: Payer: Self-pay | Admitting: Family Medicine

## 2016-10-13 DIAGNOSIS — I10 Essential (primary) hypertension: Secondary | ICD-10-CM

## 2016-10-13 MED ORDER — POTASSIUM CHLORIDE CRYS ER 20 MEQ PO TBCR: EXTENDED_RELEASE_TABLET | ORAL | Status: DC

## 2016-10-13 MED ORDER — QUINAPRIL HCL 40 MG PO TABS: ORAL_TABLET | ORAL | Status: DC

## 2016-10-13 NOTE — Telephone Encounter (Signed)
See result note.  Thanks!

## 2016-10-25 ENCOUNTER — Other Ambulatory Visit (INDEPENDENT_AMBULATORY_CARE_PROVIDER_SITE_OTHER): Payer: BLUE CROSS/BLUE SHIELD

## 2016-10-25 ENCOUNTER — Telehealth: Payer: Self-pay

## 2016-10-25 ENCOUNTER — Encounter (INDEPENDENT_AMBULATORY_CARE_PROVIDER_SITE_OTHER): Payer: Self-pay

## 2016-10-25 DIAGNOSIS — I10 Essential (primary) hypertension: Secondary | ICD-10-CM | POA: Diagnosis not present

## 2016-10-25 DIAGNOSIS — E785 Hyperlipidemia, unspecified: Secondary | ICD-10-CM

## 2016-10-25 LAB — BASIC METABOLIC PANEL
BUN: 13 mg/dL (ref 6–23)
CO2: 31 mEq/L (ref 19–32)
Calcium: 9.3 mg/dL (ref 8.4–10.5)
Chloride: 103 mEq/L (ref 96–112)
Creatinine, Ser: 0.97 mg/dL (ref 0.40–1.50)
GFR: 82.87 mL/min (ref >60.00)
Glucose, Bld: 128 mg/dL — ABNORMAL HIGH (ref 70–99)
Potassium: 3.7 mEq/L (ref 3.5–5.1)
Sodium: 142 mEq/L (ref 135–145)

## 2016-10-25 NOTE — Telephone Encounter (Signed)
Pt left note at front desk; BP was 170/102 and pt added Caduet back to his medicines that he takes. Pt started taking Caduet 6 days ago. Since taking Caduet BP 160/90.

## 2016-10-26 MED ORDER — AMLODIPINE-ATORVASTATIN 5-10 MG PO TABS: 1.0000 | ORAL_TABLET | Freq: Every day | ORAL | Status: DC

## 2016-10-26 MED ORDER — QUINAPRIL HCL 40 MG PO TABS: ORAL_TABLET | ORAL | Status: DC

## 2016-10-26 NOTE — Telephone Encounter (Signed)
Left message on voicemail for patient to call back. 

## 2016-10-26 NOTE — Telephone Encounter (Signed)
Spoke to patient by telephone and was advised that he is taking Caduet 10/10, Spironolactone 50 mg daily, 4 potassium pills a day and a whole Quinapril daily. Patient stated that he started back taking the Caduet about 6 days ago and that seems to be bringing his BP down. Patient stated that he took his BP today and it was 120/80.

## 2016-10-26 NOTE — Telephone Encounter (Signed)
call patient. See notes on labs.  Verify the patient is on Caduet 5/10 mg, spironolactone total of 50 mg a day, 4 potassium pills a day, and quinapril. See if he is taking half or a whole tablet of quinapril per day.  If he is taking a half tablet of quinapril a day, then increase to a whole tablet and update me about his blood pressure in about 10 days. If he is already taking a whole tablet, let me know so we can adjust his medications otherwise. Thanks.

## 2016-10-26 NOTE — Telephone Encounter (Signed)
Patient notified as instructed by telephone and verbalized understanding.  Follow-up lab appointment scheduled. 

## 2016-10-26 NOTE — Telephone Encounter (Signed)
Great, then continue as is and update me in about 10 days if BP isn't <140/<90, or if he has other concerns.  We can recheck his lipids in about 2 months.   Order is in.  Thanks.

## 2016-11-07 ENCOUNTER — Ambulatory Visit (HOSPITAL_COMMUNITY)
Admission: RE | Admit: 2016-11-07 | Discharge: 2016-11-07 | Disposition: A | Discharge status: Home / Self Care | Payer: BLUE CROSS/BLUE SHIELD | Source: Ambulatory Visit | Attending: Cardiovascular Disease | Admitting: Cardiovascular Disease

## 2016-11-07 DIAGNOSIS — I700 Atherosclerosis of aorta: Secondary | ICD-10-CM | POA: Insufficient documentation

## 2016-11-07 DIAGNOSIS — I714 Abdominal aortic aneurysm, without rupture: Secondary | ICD-10-CM | POA: Diagnosis not present

## 2016-11-07 DIAGNOSIS — Z8249 Family history of ischemic heart disease and other diseases of the circulatory system: Secondary | ICD-10-CM | POA: Insufficient documentation

## 2016-11-07 DIAGNOSIS — Z136 Encounter for screening for cardiovascular disorders: Secondary | ICD-10-CM | POA: Insufficient documentation

## 2016-11-07 DIAGNOSIS — E785 Hyperlipidemia, unspecified: Secondary | ICD-10-CM | POA: Insufficient documentation

## 2016-11-07 DIAGNOSIS — I10 Essential (primary) hypertension: Secondary | ICD-10-CM | POA: Insufficient documentation

## 2016-11-08 ENCOUNTER — Encounter: Payer: Self-pay | Admitting: Family Medicine

## 2016-11-08 DIAGNOSIS — I77811 Abdominal aortic ectasia: Secondary | ICD-10-CM | POA: Insufficient documentation

## 2016-11-10 ENCOUNTER — Ambulatory Visit (INDEPENDENT_AMBULATORY_CARE_PROVIDER_SITE_OTHER): Payer: BLUE CROSS/BLUE SHIELD | Admitting: Family Medicine

## 2016-11-10 ENCOUNTER — Encounter: Payer: Self-pay | Admitting: Family Medicine

## 2016-11-10 VITALS — BP 146/80 | HR 94 | Temp 98.4°F | Wt 285.2 lb

## 2016-11-10 DIAGNOSIS — I10 Essential (primary) hypertension: Secondary | ICD-10-CM

## 2016-11-10 DIAGNOSIS — I714 Abdominal aortic aneurysm, without rupture, unspecified: Secondary | ICD-10-CM

## 2016-11-10 DIAGNOSIS — I779 Disorder of arteries and arterioles, unspecified: Secondary | ICD-10-CM

## 2016-11-10 NOTE — Patient Instructions (Signed)
Recheck lipids when possible.  Rosaria Ferries will call about your referral. See her on the way out.  Update me as needed.

## 2016-11-10 NOTE — Progress Notes (Signed)
Hypertension:    Using medication without problems or lightheadedness: yes Chest pain with exertion:no Edema:no Short of breath:no Average home BPs: clearly improved with addition of caduet.  At goal on home checks.   See scanned forms regarding blood pressure.  His neck is healing up and he is follow up with surgery clinic about his lower back.   His wife is doing better in the meantime.   AAA d/w pt.  He may have work considerations related to dx.  He has no sx.  D/w pt about watching this via imaging yearly.  We talked about second opinion.  Unclear how much of the finding is due to his overall size, ie if he has larger baseline caliber vessels.    Meds, vitals, and allergies reviewed.   ROS: Per HPI unless specifically indicated in ROS section   nad ncat Mmm rrr ctab abd soft Ext w/o edema

## 2016-11-10 NOTE — Progress Notes (Signed)
Pre visit review using our clinic review tool, if applicable. No additional management support is needed unless otherwise documented below in the visit note. 

## 2016-11-12 NOTE — Assessment & Plan Note (Signed)
Discussed with patient about anatomy. This is not alarming finding that needs immediate intervention. He has questions if this will in any way impact his work. Reasonable to have the patient see cardiovascular thoracic surgeons to see if anything else should be done in the meantime. He has been working with this for an undetermined period of time without complication. I have no problem with him working.  Discussed with patient. He understood. I appreciate the help of all involved.

## 2016-11-12 NOTE — Assessment & Plan Note (Signed)
Much improved. Continue as is. We can recheck lipids later on. He agrees.

## 2016-11-15 ENCOUNTER — Other Ambulatory Visit (HOSPITAL_COMMUNITY): Payer: Self-pay | Admitting: Neurosurgery

## 2016-11-15 DIAGNOSIS — M5416 Radiculopathy, lumbar region: Secondary | ICD-10-CM

## 2016-11-17 ENCOUNTER — Encounter: Payer: Self-pay | Admitting: Vascular Surgery

## 2016-11-22 ENCOUNTER — Ambulatory Visit (HOSPITAL_COMMUNITY)
Admission: RE | Admit: 2016-11-22 | Discharge: 2016-11-22 | Disposition: A | Discharge status: Home / Self Care | Payer: BLUE CROSS/BLUE SHIELD | Source: Ambulatory Visit | Attending: Neurosurgery | Admitting: Neurosurgery

## 2016-11-22 DIAGNOSIS — M5416 Radiculopathy, lumbar region: Secondary | ICD-10-CM | POA: Diagnosis present

## 2016-11-22 DIAGNOSIS — M47817 Spondylosis without myelopathy or radiculopathy, lumbosacral region: Secondary | ICD-10-CM | POA: Insufficient documentation

## 2016-11-22 DIAGNOSIS — M48061 Spinal stenosis, lumbar region without neurogenic claudication: Secondary | ICD-10-CM | POA: Insufficient documentation

## 2016-11-22 DIAGNOSIS — M4726 Other spondylosis with radiculopathy, lumbar region: Secondary | ICD-10-CM | POA: Diagnosis not present

## 2016-11-24 ENCOUNTER — Ambulatory Visit (INDEPENDENT_AMBULATORY_CARE_PROVIDER_SITE_OTHER): Payer: BLUE CROSS/BLUE SHIELD | Admitting: Vascular Surgery

## 2016-11-24 ENCOUNTER — Encounter: Payer: Self-pay | Admitting: Vascular Surgery

## 2016-11-24 VITALS — BP 147/94 | HR 98 | Temp 98.1°F | Resp 20 | Ht 73.0 in | Wt 282.0 lb

## 2016-11-24 DIAGNOSIS — I77811 Abdominal aortic ectasia: Secondary | ICD-10-CM | POA: Diagnosis not present

## 2016-11-24 NOTE — Progress Notes (Signed)
Referred by: Tonia Ghent, MD [place], [place] [address]  Reason for referral: abdominal aortic enlargement   History of Present Illness   The patient is a 66 y.o. ([birth year]) male who presents with chief complaint: possible abdominal aneurysm.  This patient's father died from a ruptured AAA so he recently udnerwent an abdominal ultrasound to screen for such.  This demonstrated a suprarenal abdominal aorta that was 3.0 cm x 2.8 cm.  The patient does not have back or abdominal pain.  The patient notes no history of embolic episodes from his aorta.  The patient's risk factors for AAA included: age, male sex, and first degree relative with AAA.  The patient does not smoke.   Past Medical History:  Diagnosis Date  . Arthritis    feet  . Hyperlipidemia   . Hypertension   . Hypokalemia   . Hypothyroidism   . Rosacea   . Thyroid disease    Hypothyroidism    Past Surgical History:  Procedure Laterality Date  . ETT  04/15/03   wnl ST decreased < 3mm V6  . EVACUATION OF CERVICAL HEMATOMA N/A 07/21/2016   Procedure: RE-EXPLORATION OF ANTERIOR CERVICAL WOUND AND EVACUATION OF CERVICAL HEMATOMA;  Surgeon: Kary Kos, MD;  Location: [facility];  Service: Neurosurgery;  Laterality: N/A;  . HAMMER TOE SURGERY Left 01/06/2016   Procedure: LEFT SECOND HAMMER TOE CORRECTION;  Surgeon: Wylene Simmer, MD;  Location: [facility];  Service: Orthopedics;  Laterality: Left;  . [place]  . HERNIA REPAIR Bilateral 2015  . LAMINECTOMY  12/02/99   C4/5, 5/6, 6/7 Fusion (Dr. Joya Salm)  . METATARSAL OSTEOTOMY Left 01/06/2016   Procedure: LEFT SECOND METATARSAL WEIL OSTEOTOMY;  Surgeon: Wylene Simmer, MD;  Location: [facility];  Service: Orthopedics;  Laterality: Left;  Marland Kitchen Morton's Neuroma Repair  1982  . PROSTATE BIOPSY  12/10/06   Benign Jeffie Pollock)    Social History   Social History  . Marital status: Married    Spouse name: N/A  . Number of  children: 1  . Years of education: N/A   Occupational History  . 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 Smoking status: Never Smoker  . Smokeless tobacco: Never Used  . Alcohol use No  . Drug use: No  . Sexual activity: Not on file   Other Topics Concern  . 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     Family History  Problem Relation Age of Onset  . Kidney disease Mother     Acute kidney failure  . Heart disease Mother     CAD  . Hypertension Mother   . Diabetes Mother     DM and diabetic retinopathy, toe amputation  . Diabetes Other   . Heart disease Father   . AAA (abdominal aortic aneurysm) Father   . Lymphoma Maternal Uncle     X 2  . Depression Neg Hx   . Alcohol abuse Neg Hx   . Drug abuse Neg Hx   . Stroke Neg Hx   . Prostate cancer Neg Hx   . Colon cancer Neg Hx     Current Outpatient Prescriptions  Medication Sig  Dispense Refill  . amlodipine-atorvastatin (CADUET) 10-10 MG tablet Take 1 tablet by mouth daily.    Marland Kitchen doxycycline (DORYX) 100 MG EC tablet Take 100 mg by mouth 2 (two) times daily as needed. For rosacea    . levothyroxine (SYNTHROID, LEVOTHROID) 112 MCG tablet Take 1 tablet by mouth  daily except for 1 and 1/2  tablets by mouth on Sunday 100 tablet 3  . metroNIDAZOLE (METROGEL) 1 % gel Apply 1 application  topically daily as needed. 120 g 5  . Multiple Vitamin (MULTIVITAMIN) tablet Take 1 tablet by mouth daily.      . potassium chloride SA (K-DUR,KLOR-CON) 20 MEQ tablet Take 2 tablets by mouth two times daily    . quinapril (ACCUPRIL) 40 MG tablet Take 1 tablet by mouth at  bedtime    . spironolactone (ALDACTONE) 25 MG tablet Take 2 tablets by mouth  daily     No current facility-administered medications for this visit.       No Known Allergies   REVIEW OF SYSTEMS:   Cardiac:  positive for: no symptoms, negative for: Chest pain or chest pressure, Shortness of breath upon exertion and Shortness of breath when lying flat,   Vascular:  positive for: no symptoms,  negative for: Pain in calf, thigh, or hip brought on by ambulation, Pain in feet at night that wakes you up from your sleep, Blood clot in your veins and Leg swelling  Pulmonary:  positive for: no symptoms,  negative for: Oxygen at home, Productive cough and Wheezing  Neurologic:  positive for: No symptoms, negative for: Sudden weakness in arms or legs, Sudden numbness in arms or legs, Sudden onset of difficulty speaking or slurred speech, Temporary loss of vision in one eye and Problems with dizziness  Gastrointestinal:  positive for: no symptoms, negative for: Blood in stool and Vomited blood  Genitourinary:  positive for: no symptoms, negative for: Burning when urinating and Blood in urine  Psychiatric:  positive for: no symptoms,  negative for: Major depression  Hematologic:  positive for: no symptoms,  negative for: negative for: Bleeding problems and Problems with blood clotting too easily  Dermatologic:  positive for: abnormal skin lesions, negative for: Rashes or ulcers and abnormal skin lesions  Constitutional:  positive for: no symptoms, negative for: Fever or chills  Ear/Nose/Throat:  positive for: no symptoms, negative for: Change in hearing, Nose bleeds and Sore throat  Musculoskeletal:  positive for: no symptoms, negative for: Back pain, Joint pain and Muscle pain   For VQI Use Only   PRE-ADM LIVING Home  AMB STATUS Ambulatory  CAD Sx None  PRIOR CHF None  STRESS TEST No    Physical Examination   Vitals:   11/24/16 1510  BP: (!) 147/94  Pulse: 98  Resp: 20  Temp: 98.1 F (36.7 C)  TempSrc: Oral  SpO2: 96%  Weight: 282 lb (127.9 kg)  Height: 6\' 1"  (1.854 m)    Body mass index is 37.21  kg/m.  General Alert, O x 3, mildly obese, NAD  [place]/AT,    Ear/Nose/Throat Hearing grossly intact, nares without erythema or drainage, oropharynx without Erythema or Exudate, Mallampati score: 3, Dentition intact  Eyes PERRLA, EOMI,    Neck Supple, mid-line trachea,    Pulmonary Sym exp, good B air movt, CTA B  Cardiac RRR, Nl S1, S2, no Murmurs, No rubs, No S3,S4  Vascular Vessel Right Left  Radial Palpable Palpable  Brachial Palpable Palpable  Carotid Palpable, No Bruit Palpable,  No Bruit  Aorta Not palpable due to pannus N/A  Femoral Palpable Palpable  Popliteal Not palpable Not palpable  PT Palpable Palpable  DP Palpable Palpable    Gastrointestinal soft, non-distended, non-tender to palpation, No guarding or rebound, no HSM, no masses, no CVAT B, No palpable prominent aortic pulse,    Musculoskeletal M/S 5/5 throughout  , Extremities without ischemic changes  , No edema present, No obvious varicosities , No Lipodermatosclerosis present  Neurologic Cranial nerves 2-12 intact , Pain and light touch intact in extremities , Motor exam as listed above  Psychiatric Judgement intact, Mood & affect appropriate for pt's clinical situation  Dermatologic See M/S exam for extremity exam, No rashes otherwise noted  Lymphatic  Palpable lymph nodes: None    Non-Invasive Vascular Imaging   Outside aortic duplex (11/24/2016)  Current size:  3.0 cm x 2.8 cm  Suprarenal aortic ectasia   Outside Studies/Documentation   3 pages of outside documents were reviewed including: PCP chart.   Medical Decision Making   ALESSANDER SIKORSKI is a 66 y.o. (1950-10-09) male  who presents with: small suprarenal aortic ectasia   This patient's aorta does not meet the aneurysm criteria at this point, i.e. 50% greater than normal.  Annual rupture risk for 3.0 cm AAA are <1% so his risk annually is <1%/  Based on this patient's exam and diagnostic studies, he needs q2 year aortic duplex.  The threshold  for repair is AAA size > 5.5 cm, growth > 1 cm/yr, and symptomatic status.  I emphasized the importance of maximal medical management including strict control of blood pressure, blood glucose, and lipid levels, antiplatelet agents, obtaining regular exercise, and cessation of smoking.    Thank you for allowing [place] to participate in this patient's care.   Adele Barthel, MD, FACS [facility] Office: [phone] Pager: [phone]  11/24/2016, 3:57 PM

## 2017-01-01 ENCOUNTER — Other Ambulatory Visit (INDEPENDENT_AMBULATORY_CARE_PROVIDER_SITE_OTHER): Payer: BLUE CROSS/BLUE SHIELD

## 2017-01-01 DIAGNOSIS — E785 Hyperlipidemia, unspecified: Secondary | ICD-10-CM

## 2017-01-01 LAB — LIPID PANEL
Cholesterol: 155 mg/dL (ref 0–200)
HDL: 45 mg/dL (ref >39.00)
LDL Cholesterol: 89 mg/dL (ref 0–99)
NonHDL: 109.7
Total CHOL/HDL Ratio: 3
Triglycerides: 105 mg/dL (ref 0.0–149.0)
VLDL: 21 mg/dL (ref 0.0–40.0)

## 2017-01-02 ENCOUNTER — Encounter: Payer: Self-pay | Admitting: *Deleted

## 2017-02-26 ENCOUNTER — Other Ambulatory Visit: Payer: Self-pay | Admitting: Neurosurgery

## 2017-02-28 NOTE — Pre-Procedure Instructions (Signed)
Carlos Foster  02/28/2017      [place], [place] [facility] [address] [place] [address] Phone: [phone] Fax: [phone]  [id number] [place], [place] [address] [place] [address] Phone: [phone] Fax: [phone]    Your procedure is scheduled on March 02, 2017  Report to [facility] at 530 A.M.  Call this number if you have problems the morning of surgery:  [phone]   Remember:  Do not eat food or drink liquids after midnight.  Take these medicines the morning of surgery with A SIP OF WATER levothyroxine (synthroid), doxycycline (vibramycin)-if needed  7 days prior to surgery STOP taking any Aspirin, Aleve, Naproxen, Ibuprofen, Motrin, Advil, Goody's, BC's, all herbal medications, fish oil, and all vitamins   Do not wear jewelry, make-up or nail polish.  Do not wear lotions, powders, or perfumes, or deoderant.  Do not shave 48 hours prior to surgery.  Men may shave face and neck.  Do not bring valuables to the hospital.  [facility] is not responsible for any belongings or valuables.  Contacts, dentures or bridgework may not be worn into surgery.  Leave your suitcase in the car.  After surgery it may be brought to your room.  For patients admitted to the hospital, discharge time will be determined by your treatment team.  Patients discharged the day of surgery will not be allowed to drive home.   Special instructions:  - Preparing For Surgery  Before surgery, you can play an important role. Because skin is not sterile, your skin needs to be as free of germs as possible. You can reduce the number of germs on your skin by washing with CHG (chlorahexidine gluconate) Soap before surgery.  CHG is an antiseptic cleaner which kills germs and bonds with the skin to continue killing germs even after washing.  Please do not use if you have an  allergy to CHG or antibacterial soaps. If your skin becomes reddened/irritated stop using the CHG.  Do not shave (including legs and underarms) for at least 48 hours prior to first CHG shower. It is OK to shave your face.  Please follow these instructions carefully.   1. Shower the NIGHT BEFORE SURGERY and the MORNING OF SURGERY with CHG.   2. If you chose to wash your hair, wash your hair first as usual with your normal shampoo.  3. After you shampoo, rinse your hair and body thoroughly to remove the shampoo.  4. Use CHG as you would any other liquid soap. You can apply CHG directly to the skin and wash gently with a scrungie or a clean washcloth.   5. Apply the CHG Soap to your body ONLY FROM THE NECK DOWN.  Do not use on open wounds or open sores. Avoid contact with your eyes, ears, mouth and genitals (private parts). Wash genitals (private parts) with your normal soap.  6. Wash thoroughly, paying special attention to the area where your surgery will be performed.  7. Thoroughly rinse your body with warm water from the neck down.  8. DO NOT shower/wash with your normal soap after using and rinsing off the CHG Soap.  9. Pat yourself dry with a CLEAN TOWEL.   10. Wear CLEAN PAJAMAS   11. Place CLEAN SHEETS on your bed the night of your first shower and DO NOT SLEEP WITH PETS.  Day of Surgery: Do not apply any deodorants/lotions. Please wear clean clothes to the hospital/surgery center.     Please read over the following fact sheets that you were given. Pain Booklet, Coughing and Deep Breathing, Total Joint Packet and MRSA Information

## 2017-03-01 ENCOUNTER — Encounter (HOSPITAL_COMMUNITY): Payer: Self-pay

## 2017-03-01 ENCOUNTER — Encounter (HOSPITAL_COMMUNITY)
Admission: RE | Admit: 2017-03-01 | Discharge: 2017-03-01 | Disposition: A | Discharge status: Home / Self Care | Payer: BLUE CROSS/BLUE SHIELD | Source: Ambulatory Visit | Attending: Neurosurgery | Admitting: Neurosurgery

## 2017-03-01 ENCOUNTER — Encounter (HOSPITAL_COMMUNITY): Payer: Self-pay | Admitting: Anesthesiology

## 2017-03-01 DIAGNOSIS — M19071 Primary osteoarthritis, right ankle and foot: Secondary | ICD-10-CM | POA: Diagnosis not present

## 2017-03-01 DIAGNOSIS — Z833 Family history of diabetes mellitus: Secondary | ICD-10-CM | POA: Diagnosis not present

## 2017-03-01 DIAGNOSIS — Z8249 Family history of ischemic heart disease and other diseases of the circulatory system: Secondary | ICD-10-CM | POA: Diagnosis not present

## 2017-03-01 DIAGNOSIS — N62 Hypertrophy of breast: Secondary | ICD-10-CM | POA: Diagnosis not present

## 2017-03-01 DIAGNOSIS — E669 Obesity, unspecified: Secondary | ICD-10-CM | POA: Diagnosis not present

## 2017-03-01 DIAGNOSIS — E876 Hypokalemia: Secondary | ICD-10-CM | POA: Diagnosis not present

## 2017-03-01 DIAGNOSIS — E039 Hypothyroidism, unspecified: Secondary | ICD-10-CM | POA: Diagnosis not present

## 2017-03-01 DIAGNOSIS — Z841 Family history of disorders of kidney and ureter: Secondary | ICD-10-CM | POA: Diagnosis not present

## 2017-03-01 DIAGNOSIS — M1990 Unspecified osteoarthritis, unspecified site: Secondary | ICD-10-CM | POA: Diagnosis not present

## 2017-03-01 DIAGNOSIS — I739 Peripheral vascular disease, unspecified: Secondary | ICD-10-CM | POA: Diagnosis not present

## 2017-03-01 DIAGNOSIS — M48061 Spinal stenosis, lumbar region without neurogenic claudication: Secondary | ICD-10-CM | POA: Diagnosis present

## 2017-03-01 DIAGNOSIS — M5116 Intervertebral disc disorders with radiculopathy, lumbar region: Secondary | ICD-10-CM | POA: Diagnosis not present

## 2017-03-01 DIAGNOSIS — I10 Essential (primary) hypertension: Secondary | ICD-10-CM | POA: Diagnosis not present

## 2017-03-01 DIAGNOSIS — L719 Rosacea, unspecified: Secondary | ICD-10-CM | POA: Diagnosis not present

## 2017-03-01 DIAGNOSIS — Z6837 Body mass index (BMI) 37.0-37.9, adult: Secondary | ICD-10-CM | POA: Diagnosis not present

## 2017-03-01 DIAGNOSIS — E785 Hyperlipidemia, unspecified: Secondary | ICD-10-CM | POA: Diagnosis not present

## 2017-03-01 DIAGNOSIS — K219 Gastro-esophageal reflux disease without esophagitis: Secondary | ICD-10-CM | POA: Diagnosis not present

## 2017-03-01 DIAGNOSIS — M19072 Primary osteoarthritis, left ankle and foot: Secondary | ICD-10-CM | POA: Diagnosis not present

## 2017-03-01 DIAGNOSIS — M4606 Spinal enthesopathy, lumbar region: Secondary | ICD-10-CM | POA: Diagnosis not present

## 2017-03-01 DIAGNOSIS — Z807 Family history of other malignant neoplasms of lymphoid, hematopoietic and related tissues: Secondary | ICD-10-CM | POA: Diagnosis not present

## 2017-03-01 DIAGNOSIS — K449 Diaphragmatic hernia without obstruction or gangrene: Secondary | ICD-10-CM | POA: Diagnosis not present

## 2017-03-01 DIAGNOSIS — Z79899 Other long term (current) drug therapy: Secondary | ICD-10-CM | POA: Diagnosis not present

## 2017-03-01 HISTORY — DX: Gastro-esophageal reflux disease without esophagitis: K21.9

## 2017-03-01 HISTORY — DX: Personal history of other diseases of the digestive system: Z87.19

## 2017-03-01 LAB — BASIC METABOLIC PANEL
Anion gap: 9 (ref 5–15)
BUN: 19 mg/dL (ref 6–20)
CO2: 23 mmol/L (ref 22–32)
Calcium: 9.4 mg/dL (ref 8.9–10.3)
Chloride: 109 mmol/L (ref 101–111)
Creatinine, Ser: 1.15 mg/dL (ref 0.61–1.24)
GFR calc Af Amer: >60 mL/min (ref >60)
GFR calc non Af Amer: >60 mL/min (ref >60)
Glucose, Bld: 111 mg/dL — ABNORMAL HIGH (ref 65–99)
Potassium: 4.3 mmol/L (ref 3.5–5.1)
Sodium: 141 mmol/L (ref 135–145)

## 2017-03-01 LAB — SURGICAL PCR SCREEN
MRSA, PCR: NEGATIVE
Staphylococcus aureus: NEGATIVE

## 2017-03-01 LAB — CBC
HCT: 42.2 % (ref 39.0–52.0)
Hemoglobin: 14.1 g/dL (ref 13.0–17.0)
MCH: 30.5 pg (ref 26.0–34.0)
MCHC: 33.4 g/dL (ref 30.0–36.0)
MCV: 91.3 fL (ref 78.0–100.0)
Platelets: 243 10*3/uL (ref 150–400)
RBC: 4.62 MIL/uL (ref 4.22–5.81)
RDW: 14.1 % (ref 11.5–15.5)
WBC: 5.6 10*3/uL (ref 4.0–10.5)

## 2017-03-01 NOTE — Anesthesia Preprocedure Evaluation (Addendum)
Anesthesia Evaluation  Patient identified by MRN, date of birth, ID band Patient awake    Reviewed: Allergy & Precautions, NPO status , Patient's Chart, lab work & pertinent test results  Airway Mallampati: III  TM Distance: >3 FB Neck ROM: Limited    Dental no notable dental hx. (+) Teeth Intact, Caps, Dental Advisory Given   Pulmonary neg pulmonary ROS,  Snores- ?OSA   Pulmonary exam normal breath sounds clear to auscultation       Cardiovascular hypertension, Pt. on medications + Peripheral Vascular Disease  Normal cardiovascular exam Rhythm:Regular Rate:Normal  Hx/o Aortic ectasia   Neuro/Psych negative neurological ROS  negative psych ROS   GI/Hepatic Neg liver ROS, hiatal hernia, GERD  Medicated and Controlled,  Endo/Other  Hypothyroidism Obesity Gynecomastia   Renal/GU negative Renal ROS  negative genitourinary   Musculoskeletal  (+) Arthritis , Osteoarthritis,  Spinal stenosis L2-3, L3-4  Foraminal stenosis L2-3, L3-4   Abdominal (+) + obese,   Peds  Hematology negative hematology ROS (+)   Anesthesia Other Findings   Reproductive/Obstetrics                          Anesthesia Physical Anesthesia Plan  ASA: II  Anesthesia Plan: General   Post-op Pain Management:    Induction: Intravenous  PONV Risk Score and Plan: 3 and Ondansetron, Dexamethasone, Midazolam and Propofol infusion  Airway Management Planned: Oral ETT and Video Laryngoscope Planned  Additional Equipment:   Intra-op Plan:   Post-operative Plan: Extubation in OR  Informed Consent: I have reviewed the patients History and Physical, chart, labs and discussed the procedure including the risks, benefits and alternatives for the proposed anesthesia with the patient or authorized representative who has indicated his/her understanding and acceptance.   Dental advisory given  Plan Discussed with: CRNA,  Anesthesiologist and Surgeon  Anesthesia Plan Comments:        Anesthesia Quick Evaluation

## 2017-03-02 ENCOUNTER — Encounter (HOSPITAL_COMMUNITY): Payer: Self-pay

## 2017-03-02 ENCOUNTER — Encounter (HOSPITAL_COMMUNITY)
Admission: RE | Disposition: A | Discharge status: Home / Self Care | Payer: Self-pay | Source: Ambulatory Visit | Attending: Neurosurgery

## 2017-03-02 ENCOUNTER — Ambulatory Visit (HOSPITAL_COMMUNITY): Payer: BLUE CROSS/BLUE SHIELD

## 2017-03-02 ENCOUNTER — Ambulatory Visit (HOSPITAL_COMMUNITY)
Admission: RE | Admit: 2017-03-02 | Discharge: 2017-03-03 | Disposition: A | Discharge status: Home / Self Care | Payer: BLUE CROSS/BLUE SHIELD | Source: Ambulatory Visit | Attending: Neurosurgery | Admitting: Neurosurgery

## 2017-03-02 ENCOUNTER — Ambulatory Visit (HOSPITAL_COMMUNITY): Payer: BLUE CROSS/BLUE SHIELD | Admitting: Anesthesiology

## 2017-03-02 DIAGNOSIS — I739 Peripheral vascular disease, unspecified: Secondary | ICD-10-CM | POA: Insufficient documentation

## 2017-03-02 DIAGNOSIS — Z833 Family history of diabetes mellitus: Secondary | ICD-10-CM | POA: Insufficient documentation

## 2017-03-02 DIAGNOSIS — M48061 Spinal stenosis, lumbar region without neurogenic claudication: Secondary | ICD-10-CM | POA: Diagnosis present

## 2017-03-02 DIAGNOSIS — Z79899 Other long term (current) drug therapy: Secondary | ICD-10-CM | POA: Insufficient documentation

## 2017-03-02 DIAGNOSIS — K449 Diaphragmatic hernia without obstruction or gangrene: Secondary | ICD-10-CM | POA: Insufficient documentation

## 2017-03-02 DIAGNOSIS — Z841 Family history of disorders of kidney and ureter: Secondary | ICD-10-CM | POA: Insufficient documentation

## 2017-03-02 DIAGNOSIS — M4606 Spinal enthesopathy, lumbar region: Secondary | ICD-10-CM | POA: Insufficient documentation

## 2017-03-02 DIAGNOSIS — E039 Hypothyroidism, unspecified: Secondary | ICD-10-CM | POA: Insufficient documentation

## 2017-03-02 DIAGNOSIS — M1990 Unspecified osteoarthritis, unspecified site: Secondary | ICD-10-CM | POA: Insufficient documentation

## 2017-03-02 DIAGNOSIS — Z8249 Family history of ischemic heart disease and other diseases of the circulatory system: Secondary | ICD-10-CM | POA: Insufficient documentation

## 2017-03-02 DIAGNOSIS — E669 Obesity, unspecified: Secondary | ICD-10-CM | POA: Insufficient documentation

## 2017-03-02 DIAGNOSIS — E876 Hypokalemia: Secondary | ICD-10-CM | POA: Insufficient documentation

## 2017-03-02 DIAGNOSIS — Z6837 Body mass index (BMI) 37.0-37.9, adult: Secondary | ICD-10-CM | POA: Insufficient documentation

## 2017-03-02 DIAGNOSIS — M19071 Primary osteoarthritis, right ankle and foot: Secondary | ICD-10-CM | POA: Insufficient documentation

## 2017-03-02 DIAGNOSIS — I10 Essential (primary) hypertension: Secondary | ICD-10-CM | POA: Insufficient documentation

## 2017-03-02 DIAGNOSIS — L719 Rosacea, unspecified: Secondary | ICD-10-CM | POA: Insufficient documentation

## 2017-03-02 DIAGNOSIS — M19072 Primary osteoarthritis, left ankle and foot: Secondary | ICD-10-CM | POA: Insufficient documentation

## 2017-03-02 DIAGNOSIS — M5116 Intervertebral disc disorders with radiculopathy, lumbar region: Secondary | ICD-10-CM | POA: Insufficient documentation

## 2017-03-02 DIAGNOSIS — E785 Hyperlipidemia, unspecified: Secondary | ICD-10-CM | POA: Insufficient documentation

## 2017-03-02 DIAGNOSIS — N62 Hypertrophy of breast: Secondary | ICD-10-CM | POA: Insufficient documentation

## 2017-03-02 DIAGNOSIS — Z807 Family history of other malignant neoplasms of lymphoid, hematopoietic and related tissues: Secondary | ICD-10-CM | POA: Insufficient documentation

## 2017-03-02 DIAGNOSIS — K219 Gastro-esophageal reflux disease without esophagitis: Secondary | ICD-10-CM | POA: Insufficient documentation

## 2017-03-02 HISTORY — PX: LUMBAR LAMINECTOMY/DECOMPRESSION MICRODISCECTOMY: SHX5026

## 2017-03-02 SURGERY — LUMBAR LAMINECTOMY/DECOMPRESSION MICRODISCECTOMY 2 LEVELS
Anesthesia: General | Site: Back | Laterality: Left

## 2017-03-02 MED ORDER — ACETAMINOPHEN 325 MG PO TABS: 650.0000 mg | ORAL_TABLET | ORAL | Status: DC | PRN

## 2017-03-02 MED ORDER — SODIUM CHLORIDE 0.9% FLUSH: 3.0000 mL | INTRAVENOUS | Status: DC | PRN

## 2017-03-02 MED ORDER — HYDROMORPHONE HCL 1 MG/ML IJ SOLN: 0.5000 mg | INTRAMUSCULAR | Status: DC | PRN

## 2017-03-02 MED ORDER — SPIRONOLACTONE 50 MG PO TABS
50.0000 mg | ORAL_TABLET | Freq: Every day | ORAL | Status: DC
  Filled 2017-03-02 (×2): qty 1

## 2017-03-02 MED ORDER — LIDOCAINE-EPINEPHRINE 1 %-1:100000 IJ SOLN
INTRAMUSCULAR | Status: DC | PRN
  Administered 2017-03-02: 10 mL

## 2017-03-02 MED ORDER — PHENYLEPHRINE 40 MCG/ML (10ML) SYRINGE FOR IV PUSH (FOR BLOOD PRESSURE SUPPORT)
PREFILLED_SYRINGE | INTRAVENOUS | Status: AC
  Filled 2017-03-02: qty 10

## 2017-03-02 MED ORDER — PROPOFOL 10 MG/ML IV BOLUS
INTRAVENOUS | Status: DC | PRN
  Administered 2017-03-02: 40 mg via INTRAVENOUS
  Administered 2017-03-02: 160 mg via INTRAVENOUS

## 2017-03-02 MED ORDER — ATORVASTATIN CALCIUM 20 MG PO TABS
10.0000 mg | ORAL_TABLET | Freq: Every day | ORAL | Status: DC
  Administered 2017-03-02: 10 mg via ORAL
  Filled 2017-03-02: qty 1

## 2017-03-02 MED ORDER — ALUM & MAG HYDROXIDE-SIMETH 200-200-20 MG/5ML PO SUSP: 30.0000 mL | Freq: Four times a day (QID) | ORAL | Status: DC | PRN

## 2017-03-02 MED ORDER — SODIUM CHLORIDE 0.9 % IR SOLN
Status: DC | PRN
  Administered 2017-03-02: 09:00:00

## 2017-03-02 MED ORDER — MEPERIDINE HCL 25 MG/ML IJ SOLN: 6.2500 mg | INTRAMUSCULAR | Status: DC | PRN

## 2017-03-02 MED ORDER — LEVOTHYROXINE SODIUM 112 MCG PO TABS: 168.0000 ug | ORAL_TABLET | ORAL | Status: DC

## 2017-03-02 MED ORDER — ACETAMINOPHEN 650 MG RE SUPP: 650.0000 mg | RECTAL | Status: DC | PRN

## 2017-03-02 MED ORDER — AMLODIPINE-ATORVASTATIN 10-10 MG PO TABS: 1.0000 | ORAL_TABLET | Freq: Every evening | ORAL | Status: DC

## 2017-03-02 MED ORDER — METRONIDAZOLE 1 % EX GEL: Freq: Every day | CUTANEOUS | Status: DC

## 2017-03-02 MED ORDER — 0.9 % SODIUM CHLORIDE (POUR BTL) OPTIME
TOPICAL | Status: DC | PRN
  Administered 2017-03-02: 1000 mL

## 2017-03-02 MED ORDER — HEMOSTATIC AGENTS (NO CHARGE) OPTIME
TOPICAL | Status: DC | PRN
  Administered 2017-03-02: 1 via TOPICAL

## 2017-03-02 MED ORDER — ONDANSETRON HCL 4 MG/2ML IJ SOLN
INTRAMUSCULAR | Status: AC
  Filled 2017-03-02: qty 2

## 2017-03-02 MED ORDER — SODIUM CHLORIDE 0.9% FLUSH
3.0000 mL | Freq: Two times a day (BID) | INTRAVENOUS | Status: DC
  Administered 2017-03-02: 3 mL via INTRAVENOUS

## 2017-03-02 MED ORDER — LEVOTHYROXINE SODIUM 112 MCG PO TABS
112.0000 ug | ORAL_TABLET | ORAL | Status: DC
  Filled 2017-03-02: qty 1

## 2017-03-02 MED ORDER — LIDOCAINE 2% (20 MG/ML) 5 ML SYRINGE
INTRAMUSCULAR | Status: AC
  Filled 2017-03-02: qty 5

## 2017-03-02 MED ORDER — CEFAZOLIN SODIUM-DEXTROSE 2-4 GM/100ML-% IV SOLN
INTRAVENOUS | Status: AC
  Filled 2017-03-02: qty 100

## 2017-03-02 MED ORDER — PROPOFOL 10 MG/ML IV BOLUS
INTRAVENOUS | Status: AC
  Filled 2017-03-02: qty 40

## 2017-03-02 MED ORDER — ONDANSETRON HCL 4 MG/2ML IJ SOLN: 4.0000 mg | Freq: Four times a day (QID) | INTRAMUSCULAR | Status: DC | PRN

## 2017-03-02 MED ORDER — LIDOCAINE HCL (CARDIAC) 20 MG/ML IV SOLN
INTRAVENOUS | Status: DC | PRN
  Administered 2017-03-02: 100 mg via INTRAVENOUS

## 2017-03-02 MED ORDER — MENTHOL 3 MG MT LOZG: 1.0000 | LOZENGE | OROMUCOSAL | Status: DC | PRN

## 2017-03-02 MED ORDER — POTASSIUM CHLORIDE CRYS ER 20 MEQ PO TBCR
40.0000 meq | EXTENDED_RELEASE_TABLET | Freq: Two times a day (BID) | ORAL | Status: DC
  Administered 2017-03-02 (×2): 40 meq via ORAL
  Filled 2017-03-02 (×2): qty 2

## 2017-03-02 MED ORDER — LIDOCAINE-EPINEPHRINE 1 %-1:100000 IJ SOLN
INTRAMUSCULAR | Status: AC
  Filled 2017-03-02: qty 1

## 2017-03-02 MED ORDER — ONDANSETRON HCL 4 MG/2ML IJ SOLN
INTRAMUSCULAR | Status: DC | PRN
  Administered 2017-03-02: 4 mg via INTRAVENOUS

## 2017-03-02 MED ORDER — MIDAZOLAM HCL 5 MG/5ML IJ SOLN
INTRAMUSCULAR | Status: DC | PRN
  Administered 2017-03-02: 2 mg via INTRAVENOUS

## 2017-03-02 MED ORDER — CYCLOBENZAPRINE HCL 10 MG PO TABS: 10.0000 mg | ORAL_TABLET | Freq: Three times a day (TID) | ORAL | 0 refills | Status: DC | PRN

## 2017-03-02 MED ORDER — HYDROMORPHONE HCL 1 MG/ML IJ SOLN
0.2500 mg | INTRAMUSCULAR | Status: DC | PRN
  Administered 2017-03-02: 0.25 mg via INTRAVENOUS

## 2017-03-02 MED ORDER — MIDAZOLAM HCL 2 MG/2ML IJ SOLN
INTRAMUSCULAR | Status: AC
  Filled 2017-03-02: qty 2

## 2017-03-02 MED ORDER — DEXAMETHASONE SODIUM PHOSPHATE 10 MG/ML IJ SOLN
INTRAMUSCULAR | Status: DC | PRN
  Administered 2017-03-02: 10 mg via INTRAVENOUS

## 2017-03-02 MED ORDER — SUCCINYLCHOLINE CHLORIDE 20 MG/ML IJ SOLN
INTRAMUSCULAR | Status: DC | PRN
  Administered 2017-03-02: 50 mg via INTRAVENOUS
  Administered 2017-03-02: 140 mg via INTRAVENOUS

## 2017-03-02 MED ORDER — FENTANYL CITRATE (PF) 250 MCG/5ML IJ SOLN
INTRAMUSCULAR | Status: AC
  Filled 2017-03-02: qty 5

## 2017-03-02 MED ORDER — GLYCOPYRROLATE 0.2 MG/ML IJ SOLN
INTRAMUSCULAR | Status: DC | PRN
  Administered 2017-03-02 (×2): 0.1 mg via INTRAVENOUS

## 2017-03-02 MED ORDER — THROMBIN 5000 UNITS EX SOLR
CUTANEOUS | Status: DC | PRN
  Administered 2017-03-02 (×2): 5000 [IU] via TOPICAL

## 2017-03-02 MED ORDER — SUGAMMADEX SODIUM 500 MG/5ML IV SOLN
INTRAVENOUS | Status: AC
  Filled 2017-03-02: qty 5

## 2017-03-02 MED ORDER — POLYVINYL ALCOHOL 1.4 % OP SOLN
1.0000 [drp] | Freq: Three times a day (TID) | OPHTHALMIC | Status: DC | PRN
  Filled 2017-03-02: qty 15

## 2017-03-02 MED ORDER — LEVOTHYROXINE SODIUM 112 MCG PO TABS: 112.0000 ug | ORAL_TABLET | ORAL | Status: DC

## 2017-03-02 MED ORDER — QUINAPRIL HCL 10 MG PO TABS
40.0000 mg | ORAL_TABLET | Freq: Every day | ORAL | Status: DC
  Administered 2017-03-02: 40 mg via ORAL
  Filled 2017-03-02 (×2): qty 4

## 2017-03-02 MED ORDER — OXYCODONE HCL 5 MG PO TABS: 5.0000 mg | ORAL_TABLET | ORAL | 0 refills | Status: DC | PRN

## 2017-03-02 MED ORDER — FENTANYL CITRATE (PF) 100 MCG/2ML IJ SOLN
INTRAMUSCULAR | Status: DC | PRN
  Administered 2017-03-02: 150 ug via INTRAVENOUS

## 2017-03-02 MED ORDER — PROMETHAZINE HCL 25 MG/ML IJ SOLN: 6.2500 mg | INTRAMUSCULAR | Status: DC | PRN

## 2017-03-02 MED ORDER — THROMBIN 5000 UNITS EX SOLR
CUTANEOUS | Status: AC
  Filled 2017-03-02: qty 10000

## 2017-03-02 MED ORDER — PHENOL 1.4 % MT LIQD: 1.0000 | OROMUCOSAL | Status: DC | PRN

## 2017-03-02 MED ORDER — ONDANSETRON HCL 4 MG PO TABS: 4.0000 mg | ORAL_TABLET | Freq: Four times a day (QID) | ORAL | Status: DC | PRN

## 2017-03-02 MED ORDER — LACTATED RINGERS IV SOLN
INTRAVENOUS | Status: DC | PRN
  Administered 2017-03-02 (×2): via INTRAVENOUS

## 2017-03-02 MED ORDER — ROCURONIUM BROMIDE 100 MG/10ML IV SOLN
INTRAVENOUS | Status: DC | PRN
  Administered 2017-03-02: 10 mg via INTRAVENOUS

## 2017-03-02 MED ORDER — OXYCODONE HCL 5 MG PO TABS
5.0000 mg | ORAL_TABLET | ORAL | Status: DC | PRN
  Administered 2017-03-02 (×2): 10 mg via ORAL
  Administered 2017-03-03: 5 mg via ORAL
  Filled 2017-03-02 (×3): qty 2

## 2017-03-02 MED ORDER — DEXAMETHASONE SODIUM PHOSPHATE 10 MG/ML IJ SOLN
INTRAMUSCULAR | Status: AC
  Filled 2017-03-02: qty 1

## 2017-03-02 MED ORDER — CEFAZOLIN SODIUM-DEXTROSE 2-4 GM/100ML-% IV SOLN
2.0000 g | Freq: Three times a day (TID) | INTRAVENOUS | Status: AC
  Administered 2017-03-02 – 2017-03-03 (×2): 2 g via INTRAVENOUS
  Filled 2017-03-02 (×2): qty 100

## 2017-03-02 MED ORDER — BUPIVACAINE HCL (PF) 0.25 % IJ SOLN
INTRAMUSCULAR | Status: DC | PRN
  Administered 2017-03-02: 10 mL

## 2017-03-02 MED ORDER — CEFAZOLIN SODIUM-DEXTROSE 1-4 GM/50ML-% IV SOLN
INTRAVENOUS | Status: AC
  Filled 2017-03-02: qty 50

## 2017-03-02 MED ORDER — SUGAMMADEX SODIUM 500 MG/5ML IV SOLN
INTRAVENOUS | Status: DC | PRN
Start: 2017-03-02 — End: 2017-03-02
  Administered 2017-03-02: 263 mg via INTRAVENOUS

## 2017-03-02 MED ORDER — ROCURONIUM BROMIDE 10 MG/ML (PF) SYRINGE
PREFILLED_SYRINGE | INTRAVENOUS | Status: AC
  Filled 2017-03-02: qty 5

## 2017-03-02 MED ORDER — SUCCINYLCHOLINE CHLORIDE 200 MG/10ML IV SOSY
PREFILLED_SYRINGE | INTRAVENOUS | Status: AC
  Filled 2017-03-02: qty 10

## 2017-03-02 MED ORDER — PHENYLEPHRINE HCL 10 MG/ML IJ SOLN
INTRAMUSCULAR | Status: DC | PRN
  Administered 2017-03-02: 80 ug via INTRAVENOUS
  Administered 2017-03-02: 40 ug via INTRAVENOUS
  Administered 2017-03-02: 80 ug via INTRAVENOUS

## 2017-03-02 MED ORDER — BUPIVACAINE HCL (PF) 0.25 % IJ SOLN
INTRAMUSCULAR | Status: AC
  Filled 2017-03-02: qty 30

## 2017-03-02 MED ORDER — CYCLOBENZAPRINE HCL 10 MG PO TABS
10.0000 mg | ORAL_TABLET | Freq: Three times a day (TID) | ORAL | Status: DC | PRN
  Administered 2017-03-02 – 2017-03-03 (×3): 10 mg via ORAL
  Filled 2017-03-02 (×3): qty 1

## 2017-03-02 MED ORDER — AMLODIPINE BESYLATE 10 MG PO TABS
10.0000 mg | ORAL_TABLET | Freq: Every day | ORAL | Status: DC
  Administered 2017-03-02: 10 mg via ORAL
  Filled 2017-03-02: qty 1

## 2017-03-02 MED ORDER — HYDROMORPHONE HCL 1 MG/ML IJ SOLN
INTRAMUSCULAR | Status: AC
  Filled 2017-03-02: qty 1

## 2017-03-02 MED ORDER — DEXTROSE 5 % IV SOLN
3.0000 g | Freq: Once | INTRAVENOUS | Status: AC
  Administered 2017-03-02: 3 g via INTRAVENOUS

## 2017-03-02 MED ORDER — PANTOPRAZOLE SODIUM 40 MG IV SOLR
40.0000 mg | Freq: Every day | INTRAVENOUS | Status: DC
  Administered 2017-03-02: 40 mg via INTRAVENOUS
  Filled 2017-03-02: qty 40

## 2017-03-02 SURGICAL SUPPLY — 57 items
BAG DECANTER FOR FLEXI CONT (MISCELLANEOUS) ×2 IMPLANT
BENZOIN TINCTURE PRP APPL 2/3 (GAUZE/BANDAGES/DRESSINGS) ×2 IMPLANT
BLADE CLIPPER SURG (BLADE) ×2 IMPLANT
BLADE SURG 11 STRL SS (BLADE) ×2 IMPLANT
BUR CUTTER 7.0 ROUND (BURR) ×2 IMPLANT
BUR MATCHSTICK NEURO 3.0 LAGG (BURR) ×2 IMPLANT
CANISTER SUCT 3000ML PPV (MISCELLANEOUS) ×2 IMPLANT
CARTRIDGE OIL MAESTRO DRILL (MISCELLANEOUS) ×1 IMPLANT
DECANTER SPIKE VIAL GLASS SM (MISCELLANEOUS) ×2 IMPLANT
DERMABOND ADVANCED (GAUZE/BANDAGES/DRESSINGS) ×1
DERMABOND ADVANCED .7 DNX12 (GAUZE/BANDAGES/DRESSINGS) ×1 IMPLANT
DIFFUSER DRILL AIR PNEUMATIC (MISCELLANEOUS) ×2 IMPLANT
DRAPE HALF SHEET 40X57 (DRAPES) IMPLANT
DRAPE LAPAROTOMY 100X72X124 (DRAPES) ×2 IMPLANT
DRAPE MICROSCOPE LEICA (MISCELLANEOUS) ×2 IMPLANT
DRAPE POUCH INSTRU U-SHP 10X18 (DRAPES) ×2 IMPLANT
DRAPE SURG 17X23 STRL (DRAPES) ×2 IMPLANT
DRSG OPSITE POSTOP 4X6 (GAUZE/BANDAGES/DRESSINGS) ×2 IMPLANT
DURAPREP 26ML APPLICATOR (WOUND CARE) ×2 IMPLANT
ELECT BLADE 4.0 EZ CLEAN MEGAD (MISCELLANEOUS) ×2
ELECT REM PT RETURN 9FT ADLT (ELECTROSURGICAL) ×2
ELECTRODE BLDE 4.0 EZ CLN MEGD (MISCELLANEOUS) ×1 IMPLANT
ELECTRODE REM PT RTRN 9FT ADLT (ELECTROSURGICAL) ×1 IMPLANT
EVACUATOR 1/8 PVC DRAIN (DRAIN) ×2 IMPLANT
GAUZE SPONGE 4X4 12PLY STRL (GAUZE/BANDAGES/DRESSINGS) ×2 IMPLANT
GAUZE SPONGE 4X4 16PLY XRAY LF (GAUZE/BANDAGES/DRESSINGS) IMPLANT
GLOVE BIO SURGEON STRL SZ7 (GLOVE) IMPLANT
GLOVE BIO SURGEON STRL SZ8 (GLOVE) ×2 IMPLANT
GLOVE BIOGEL PI IND STRL 7.0 (GLOVE) IMPLANT
GLOVE BIOGEL PI INDICATOR 7.0 (GLOVE)
GLOVE ECLIPSE 7.5 STRL STRAW (GLOVE) IMPLANT
GLOVE EXAM NITRILE LRG STRL (GLOVE) IMPLANT
GLOVE EXAM NITRILE XL STR (GLOVE) IMPLANT
GLOVE EXAM NITRILE XS STR PU (GLOVE) IMPLANT
GLOVE INDICATOR 8.5 STRL (GLOVE) ×2 IMPLANT
GOWN STRL REUS W/ TWL LRG LVL3 (GOWN DISPOSABLE) ×1 IMPLANT
GOWN STRL REUS W/ TWL XL LVL3 (GOWN DISPOSABLE) ×2 IMPLANT
GOWN STRL REUS W/TWL 2XL LVL3 (GOWN DISPOSABLE) IMPLANT
GOWN STRL REUS W/TWL LRG LVL3 (GOWN DISPOSABLE) ×1
GOWN STRL REUS W/TWL XL LVL3 (GOWN DISPOSABLE) ×2
KIT BASIN OR (CUSTOM PROCEDURE TRAY) ×2 IMPLANT
KIT ROOM TURNOVER OR (KITS) ×2 IMPLANT
NEEDLE HYPO 22GX1.5 SAFETY (NEEDLE) ×2 IMPLANT
NEEDLE SPNL 22GX3.5 QUINCKE BK (NEEDLE) ×2 IMPLANT
NS IRRIG 1000ML POUR BTL (IV SOLUTION) ×2 IMPLANT
OIL CARTRIDGE MAESTRO DRILL (MISCELLANEOUS) ×2
PACK LAMINECTOMY NEURO (CUSTOM PROCEDURE TRAY) ×2 IMPLANT
RUBBERBAND STERILE (MISCELLANEOUS) ×4 IMPLANT
SPONGE SURGIFOAM ABS GEL SZ50 (HEMOSTASIS) ×2 IMPLANT
STRIP CLOSURE SKIN 1/2X4 (GAUZE/BANDAGES/DRESSINGS) ×2 IMPLANT
SUT VIC AB 0 CT1 18XCR BRD8 (SUTURE) ×1 IMPLANT
SUT VIC AB 0 CT1 8-18 (SUTURE) ×1
SUT VIC AB 2-0 CT1 18 (SUTURE) ×2 IMPLANT
SUT VICRYL 4-0 PS2 18IN ABS (SUTURE) ×2 IMPLANT
TOWEL GREEN STERILE (TOWEL DISPOSABLE) ×2 IMPLANT
TOWEL GREEN STERILE FF (TOWEL DISPOSABLE) ×2 IMPLANT
WATER STERILE IRR 1000ML POUR (IV SOLUTION) ×2 IMPLANT

## 2017-03-02 NOTE — Op Note (Signed)
Preoperative diagnosis: Lumbar spinal stenosis L2-3 L3-4 with left-sided L3 and L4 radiculopathies  Postoperative diagnosis: Same  Procedure: Decompressive lumbar laminectomy L2-3 and L3-4 partial medial facetectomies and foraminotomies of the L3 and L4 nerve roots. Microscopic dissection of the L2-3 and L4 nerve roots with microscopic foraminotomies and partial facetectomies.  Surgeon: Dominica Severin Travus Oren  Asst.: Newman Pies  Anesthesia: Gen.  EBL: Minimal  History of present illness: Patient is very pleasant 66 year old germicide predominant left hip and leg pain rating down L3 and L4 nerve root pattern workup revealed severe spinal stenosis at L2-3 and L3-4. Due the patient's today conservative treatment imaging findings and progression of clinical syndrome I recommended decompressive laminotomies at those levels. I extensively went over the risks and benefits of a operation with the patient as well as perioperative course expectations of outcome and alternatives of surgery and he understands and agrees to proceed forward.  The operative procedure: Patient was brought into the or was using general anesthesia positioned prone on the Wilson frame his back was prepped and draped in routine sterile fashion. Preoperative x-ray localize the appropriate level so after infiltration 10 mL lidocaine with epi a midline incision was made and Bovie light cautery was used to take down the subcutaneous tissues tissues and subperiosteal dissections care lamina of L2-3 and L4 on the left. Interoperative x-ray confirmed indication appropriate level so a high-speed was drilled down the inferior aspect of the lateral to medial facet complex superior and inferior part of L3 and superior L4. Laminotomy was begun with a 2 and 3 minute Kerrison punch. Ligament flavum was a markedly hypertrophied this was removed in piecemeal fashion. Then under microscopic illumination this a very large spur at L2-3 causing hourglass  compression of the thecal sac this was teased off the dura and removed in piecemeal fashion. Under biting the medial gutter also removed operatively ligament and decompressed the L3 foramen. At the end of decompression was no further stenosis either centrally or foraminally at L2-3. Tensions and taken L3-4 and a similar fashion was a large medial spur this was removed decompress the central canal and unroofing and decompress and the L4 nerve root. At the end of the decompression was no further stenosis at either level was cut was irrigated meticulous hemostasis was maintained a medium Hemovac drain was placed and the wounds closed in layers with interrupted Vicryl skin was closed running 4 septic particular Dermabond benzo and Steri-Strips and sterile dressings applied patient recovered in stable condition. At the end of case all needle counts and sponge counts were correct.

## 2017-03-02 NOTE — Discharge Summary (Addendum)
  Physician Discharge Summary  Patient ID: Carlos Foster MRN: [id number] DOB/AGE: [birth year] 66 y.o.  Admit date: 03/02/2017 Discharge date: 03/02/2017  Admission Diagnoses:Lumbar spinal stenosis L2-3 L3-4  Discharge Diagnoses: Same Active Problems:   Spinal stenosis of lumbar region   Discharged Condition: good  Hospital Course: Patient admitted hospital underwent decompressive laminectomies at L2-3 and L3-4 on the left. Postoperative patient did very well recovered in the floor on the floor was angling and voiding spontaneously tolerating regular diet pain was significantly improved the left looks 64. His drain output was still lobe at high to be discharged on the day of postop day 0 but he will be discharged on the morning of postop day 1. He will be discharged scheduled follow-up in one to 2 weeks.  Consults: Significant Diagnostic Studies: Treatments: Left-sided decompressive limiting L2-3 L3-4 Discharge Exam: Blood pressure 121/83, pulse 95, temperature 98.4 F (36.9 C), resp. rate 18, height 6\' 2"  (1.88 m), weight 131.5 kg (290 lb), SpO2 95 %. Strength out of 5 wound clean dry and intact  Disposition: Home   Allergies as of 03/02/2017   No Known Allergies     Medication List    TAKE these medications   amlodipine-atorvastatin 10-10 MG tablet Commonly known as:  CADUET Take 1 tablet by mouth every evening.   cyclobenzaprine 10 MG tablet Commonly known as:  FLEXERIL Take 1 tablet (10 mg total) by mouth 3 (three) times daily as needed for muscle spasms.   doxycycline 100 MG capsule Commonly known as:  VIBRAMYCIN Take 100 mg by mouth 2 (two) times daily as needed. For rosacea flare ups   levothyroxine 112 MCG tablet Commonly known as:  SYNTHROID, LEVOTHROID Take 1 tablet by mouth  daily except for 1 and 1/2  tablets by mouth on Sunday What changed:  how much to take  how to take this  when to take this  additional instructions   LUBRICANT EYE DROPS 0.4-0.3 %  Soln Generic drug:  Polyethyl Glycol-Propyl Glycol Place 1 drop into both eyes 3 (three) times daily as needed (for dry eyes/contact irritation).   metroNIDAZOLE 1 % gel Commonly known as:  METROGEL Apply 1 application  topically daily as needed. What changed:  See the new instructions.   oxyCODONE 5 MG immediate release tablet Commonly known as:  Oxy IR/ROXICODONE Take 1-2 tablets (5-10 mg total) by mouth every 3 (three) hours as needed for breakthrough pain.   potassium chloride SA 20 MEQ tablet Commonly known as:  K-DUR,KLOR-CON Take 2 tablets by mouth two times daily What changed:  how much to take  how to take this  when to take this  additional instructions   quinapril 40 MG tablet Commonly known as:  ACCUPRIL Take 1 tablet by mouth at  bedtime What changed:  how much to take  how to take this  when to take this  additional instructions   spironolactone 25 MG tablet Commonly known as:  ALDACTONE Take 2 tablets by mouth  daily What changed:  how much to take  how to take this  when to take this  additional instructions        Signed: CRAM,GARY P 03/02/2017, 5:55 PM

## 2017-03-02 NOTE — Anesthesia Postprocedure Evaluation (Signed)
Anesthesia Post Note  Patient: Carlos Foster  Procedure(s) Performed: Procedure(s) (LRB): Laminectomy and Foraminotomy - L2-L3 - L3-L4 - left (Left)     Patient location during evaluation: PACU Anesthesia Type: General Level of consciousness: awake and alert and oriented Pain management: pain level controlled Vital Signs Assessment: post-procedure vital signs reviewed and stable Respiratory status: spontaneous breathing, nonlabored ventilation and respiratory function stable Cardiovascular status: blood pressure returned to baseline and stable Postop Assessment: no signs of nausea or vomiting Anesthetic complications: no    Last Vitals:  Vitals:   03/02/17 0611  BP: (!) 142/80  Pulse: 90  Resp: 18  Temp: 37 C    Last Pain:  Vitals:   03/02/17 1116  TempSrc:   PainSc: 7                  Chae Oommen A.

## 2017-03-02 NOTE — Transfer of Care (Signed)
Immediate Anesthesia Transfer of Care Note  Patient: Carlos Foster  Procedure(s) Performed: Procedure(s): Laminectomy and Foraminotomy - L2-L3 - L3-L4 - left (Left)  Patient Location: PACU  Anesthesia Type:General  Level of Consciousness: awake, oriented and patient cooperative  Airway & Oxygen Therapy: Patient Spontanous Breathing and Patient connected to face mask oxygen  Post-op Assessment: Report given to RN and Post -op Vital signs reviewed and stable  Post vital signs: Reviewed  Last Vitals:  Vitals:   03/02/17 0611  BP: (!) 142/80  Pulse: 90  Resp: 18  Temp: 37 C    Last Pain:  Vitals:   03/02/17 0611  TempSrc: Oral      Patients Stated Pain Goal: 2 (31/59/45 8592)  Complications: No apparent anesthesia complications

## 2017-03-02 NOTE — H&P (Signed)
Carlos Foster is an 66 y.o. male.   Chief Complaint: Back and left leg pain HPI: Patient is very pleasant 45 gentleman is a long-standing back and left leg pain rating to her anterior quad for admission consistent with an L3 and L4 nerve root pattern. Workup revealed severe spinal stenosis and compression of left L3 and L4 nerve roots. Due to patient's failed conservative treatment imaging findings and progression of clinical syndrome I recommended laminotomies at L2-3 and L3 for possible discectomies I extensively went over the risks and benefits of the operation with him as well as perioperative course expectations of outcome and alternatives surgery and he understands and agrees to proceed forward.  Past Medical History:  Diagnosis Date  . Arthritis    feet  . GERD (gastroesophageal reflux disease)   . History of hiatal hernia   . Hyperlipidemia   . Hypertension   . Hypokalemia   . Hypothyroidism   . Rosacea   . Thyroid disease    Hypothyroidism    Past Surgical History:  Procedure Laterality Date  . C2 C3 Laminectomy  07/09/2016  . COLONOSCOPY    . ETT  04/15/03   wnl ST decreased < 29m V6  . EVACUATION OF CERVICAL HEMATOMA N/A 07/21/2016   Procedure: RE-EXPLORATION OF ANTERIOR CERVICAL WOUND AND EVACUATION OF CERVICAL HEMATOMA;  Surgeon: GKary Kos MD;  [facility] [facility]  Service: Neurosurgery;  Laterality: N/A;  . HAMMER TOE SURGERY Left 01/06/2016   Procedure: LEFT SECOND HAMMER TOE CORRECTION;  Surgeon: JWylene Simmer MD;  [place] [facility]  Service: Orthopedics;  Laterality: Left;  . [facility] . HERNIA REPAIR Bilateral 2015   inguinal  . LAMINECTOMY  12/02/99   C4/5, 5/6, 6/7 Fusion (Dr. BJoya Salm  . METATARSAL OSTEOTOMY Left 01/06/2016   Procedure: LEFT SECOND METATARSAL WEIL OSTEOTOMY;  Surgeon: JWylene Simmer MD;  [place] [facility]  Service: Orthopedics;  Laterality: Left;  .Marland KitchenMorton's Neuroma Repair Right 1982   foot  .  PROSTATE BIOPSY  12/10/06   Benign (Jeffie Pollock    Family History  Problem Relation Age of Onset  . Kidney disease Mother        Acute kidney failure  . Heart disease Mother        CAD  . Hypertension Mother   . Diabetes Mother        DM and diabetic retinopathy, toe amputation  . Diabetes Other   . Heart disease Father   . AAA (abdominal aortic aneurysm) Father   . Lymphoma Maternal Uncle        X 2  . Depression Neg Hx   . Alcohol abuse Neg Hx   . Drug abuse Neg Hx   . Stroke Neg Hx   . Prostate cancer Neg Hx   . Colon cancer Neg Hx    Social History:  reports that he has never smoked. He has never used smokeless tobacco. He reports that he does not drink alcohol or use drugs.  Allergies: No Known Allergies  Medications Prior to Admission  Medication Sig Dispense Refill  . amlodipine-atorvastatin (CADUET) 10-10 MG tablet Take 1 tablet by mouth every evening.     .Marland Kitchendoxycycline (VIBRAMYCIN) 100 MG capsule Take 100 mg by mouth 2 (two) times daily as needed. For rosacea flare ups  2  . levothyroxine (SYNTHROID, LEVOTHROID) 112 MCG tablet Take 1 tablet by mouth  daily except for 1 and 1/2  tablets by mouth on Sunday (  Patient taking differently: Take 112-168 mcg by mouth See admin instructions. Take 1 tablet by mouth  daily except for 1 and 1/2  tablets by mouth on Sunday) 100 tablet 3  . Polyethyl Glycol-Propyl Glycol (LUBRICANT EYE DROPS) 0.4-0.3 % SOLN Place 1 drop into both eyes 3 (three) times daily as needed (for dry eyes/contact irritation).    . potassium chloride SA (K-DUR,KLOR-CON) 20 MEQ tablet Take 2 tablets by mouth two times daily (Patient taking differently: Take 40 mEq by mouth 2 (two) times daily. )    . quinapril (ACCUPRIL) 40 MG tablet Take 1 tablet by mouth at  bedtime (Patient taking differently: Take 40 mg by mouth daily. )    . spironolactone (ALDACTONE) 25 MG tablet Take 2 tablets by mouth  daily (Patient taking differently: Take 50 mg by mouth daily. )    .  metroNIDAZOLE (METROGEL) 1 % gel Apply 1 application  topically daily as needed. (Patient taking differently: Apply 1 application  topically daily as needed for rosacea.) 120 g 5    Results for orders placed or performed during the hospital encounter of 03/01/17 (from the past 48 hour(s))  Surgical pcr screen     Status: None   Collection Time: 03/01/17 10:54 AM  Result Value Ref Range   MRSA, PCR NEGATIVE NEGATIVE   Staphylococcus aureus NEGATIVE NEGATIVE    Comment:        The Xpert SA Assay (FDA approved for NASAL specimens in patients over 76 years of age), is one component of a comprehensive surveillance program.  Test performance has been validated by [facility] for patients greater than or equal to 66 year old. It is not intended to diagnose infection nor to guide or monitor treatment.   Basic metabolic panel     Status: Abnormal   Collection Time: 03/01/17 10:55 AM  Result Value Ref Range   Sodium 141 135 - 145 mmol/L   Potassium 4.3 3.5 - 5.1 mmol/L   Chloride 109 101 - 111 mmol/L   CO2 23 22 - 32 mmol/L   Glucose, Bld 111 (H) 65 - 99 mg/dL   BUN 19 6 - 20 mg/dL   Creatinine, Ser 1.15 0.61 - 1.24 mg/dL   Calcium 9.4 8.9 - 10.3 mg/dL   GFR calc non Af Amer >60 >60 mL/min   GFR calc Af Amer >60 >60 mL/min    Comment: (NOTE) The eGFR has been calculated using the CKD EPI equation. This calculation has not been validated in all clinical situations. eGFR's persistently <60 mL/min signify possible Chronic Kidney Disease.    Anion gap 9 5 - 15  CBC     Status: None   Collection Time: 03/01/17 10:55 AM  Result Value Ref Range   WBC 5.6 4.0 - 10.5 K/uL   RBC 4.62 4.22 - 5.81 MIL/uL   Hemoglobin 14.1 13.0 - 17.0 g/dL   HCT 42.2 39.0 - 52.0 %   MCV 91.3 78.0 - 100.0 fL   MCH 30.5 26.0 - 34.0 pg   MCHC 33.4 30.0 - 36.0 g/dL   RDW 14.1 11.5 - 15.5 %   Platelets 243 150 - 400 K/uL   No results found.  Review of Systems  Musculoskeletal: Positive for back pain and  myalgias.    Blood pressure (!) 142/80, pulse 90, temperature 98.6 F (37 C), temperature source Oral, resp. rate 18, height 6' 2" (1.88 m), weight 131.5 kg (290 lb), SpO2 98 %. Physical Exam  Constitutional: He is  oriented to person, place, and time. He appears well-developed and well-nourished.  Eyes: Pupils are equal, round, and reactive to light.  Neck: Normal range of motion.  Respiratory: Effort normal.  GI: Soft.  Neurological: He is alert and oriented to person, place, and time. He has normal strength. GCS eye subscore is 4. GCS verbal subscore is 5. GCS motor subscore is 6.  Strength 5 out of 5 iliopsoas, quads, hip she's, gastric, into tibialis, and EHL.     Assessment/Plan 66 year old gentleman presents for L2-3 L3-4 left-sided laminectomy microdiscectomy.  CRAM,GARY P, MD 03/02/2017, 8:08 AM

## 2017-03-02 NOTE — Anesthesia Procedure Notes (Signed)
Procedure Name: Intubation Date/Time: 03/02/2017 9:12 AM Performed by: Luciana Axe K Pre-anesthesia Checklist: Patient identified, Emergency Drugs available, Suction available and Patient being monitored Patient Re-evaluated:Patient Re-evaluated prior to induction Oxygen Delivery Method: Circle System Utilized Preoxygenation: Pre-oxygenation with 100% oxygen Induction Type: IV induction Ventilation: Mask ventilation without difficulty Grade View: Grade I Tube type: Oral Tube size: 7.5 mm Number of attempts: 1 Airway Equipment and Method: Stylet,  Oral airway and Video-laryngoscopy Placement Confirmation: ETT inserted through vocal cords under direct vision,  positive ETCO2 and breath sounds checked- equal and bilateral Secured at: 23 cm Tube secured with: Tape Dental Injury: Teeth and Oropharynx as per pre-operative assessment  Difficulty Due To: Difficulty was anticipated, Difficult Airway- due to anterior larynx and Difficult Airway- due to reduced neck mobility

## 2017-03-03 ENCOUNTER — Encounter (HOSPITAL_COMMUNITY): Payer: Self-pay | Admitting: Neurosurgery

## 2017-03-03 DIAGNOSIS — M48061 Spinal stenosis, lumbar region without neurogenic claudication: Secondary | ICD-10-CM | POA: Diagnosis not present

## 2017-03-03 NOTE — Progress Notes (Signed)
Patient alert and oriented, mae's well, voiding adequate amount of urine, swallowing without difficulty, no c/o pain at time of discharge. Patient discharged home with family. Script and discharged instructions given to patient. Patient and family stated understanding of instructions given. Patient has an appointment with Dr. Cram 

## 2017-03-03 NOTE — Discharge Instructions (Signed)

## 2017-04-11 ENCOUNTER — Encounter: Payer: Self-pay | Admitting: Family Medicine

## 2017-04-11 ENCOUNTER — Ambulatory Visit (INDEPENDENT_AMBULATORY_CARE_PROVIDER_SITE_OTHER): Payer: BLUE CROSS/BLUE SHIELD | Admitting: Family Medicine

## 2017-04-11 VITALS — BP 144/90 | HR 113 | Temp 98.4°F | Ht 74.0 in | Wt 287.0 lb

## 2017-04-11 DIAGNOSIS — H1030 Unspecified acute conjunctivitis, unspecified eye: Secondary | ICD-10-CM

## 2017-04-11 MED ORDER — TOBRAMYCIN-DEXAMETHASONE 0.3-0.1 % OP SUSP: 2.0000 [drp] | OPHTHALMIC | 0 refills | Status: DC

## 2017-04-11 NOTE — Patient Instructions (Signed)
WE NOW OFFER   [facility] FAST TRACK!!!  SAME DAY Appointments for ACUTE CARE  Such as: Sprains, Injuries, cuts, abrasions, rashes, muscle pain, joint pain, back pain Colds, flu, sore throats, headache, allergies, cough, fever  Ear pain, sinus and eye infections Abdominal pain, nausea, vomiting, diarrhea, upset stomach Animal/insect bites  3 Easy Ways to Schedule: Walk-In Scheduling Call in scheduling Mychart Sign-up: [email or web address]         

## 2017-04-11 NOTE — Progress Notes (Signed)
   Subjective:    Patient ID: Carlos Foster, male    DOB: 1951-01-13, 66 y.o.   MRN: [id number]  HPI He woke up this am with redness, itching and burning in the right eye. No eye pain per se. No vision changes, he normally wears contacts but he left the right one out today. No URI symptoms.    Review of Systems  Constitutional: Negative.   HENT: Negative.   Eyes: Positive for redness and itching. Negative for photophobia, pain, discharge and visual disturbance.  Respiratory: Negative.        Objective:   Physical Exam  Constitutional: He appears well-developed and well-nourished.  HENT:  Right Ear: External ear normal.  Left Ear: External ear normal.  Nose: Nose normal.  Mouth/Throat: Oropharynx is clear and moist.  Eyes: Pupils are equal, round, and reactive to light.  No photophobia. The right conjunctiva is red, not swollen.   Neck: No thyromegaly present.  Pulmonary/Chest: Effort normal and breath sounds normal. No respiratory distress. He has no wheezes. He has no rales.  Lymphadenopathy:    He has no cervical adenopathy.          Assessment & Plan:  Conjunctivitis, try Tobradex drops prn. Use cool compresses prn. Alysia Penna, MD

## 2017-05-03 LAB — LIPID PANEL
Cholesterol: 144 (ref 0–200)
HDL: 44 (ref 35–70)
LDL Cholesterol: 84
Triglycerides: 80 (ref 40–160)

## 2017-05-03 LAB — PSA: PSA: 2.9

## 2017-05-03 LAB — CBC AND DIFFERENTIAL
Hemoglobin: 14 (ref 13.5–17.5)
Platelets: 237 (ref 150–399)

## 2017-05-03 LAB — HEPATIC FUNCTION PANEL
ALT: 34 (ref 10–40)
AST: 25 (ref 14–40)

## 2017-05-03 LAB — BASIC METABOLIC PANEL
Creatinine: 1.2 (ref 0.6–1.3)
Glucose: 111
Potassium: 4.4 (ref 3.4–5.3)

## 2017-05-03 LAB — TSH: TSH: 9.06 — AB (ref 0.41–5.90)

## 2017-05-07 ENCOUNTER — Other Ambulatory Visit: Payer: Self-pay | Admitting: *Deleted

## 2017-05-07 DIAGNOSIS — E039 Hypothyroidism, unspecified: Secondary | ICD-10-CM

## 2017-05-07 NOTE — Telephone Encounter (Signed)
Levothyroxine 112 mcg MYLAN on backorder.  OK to change to [facility]?

## 2017-05-08 NOTE — Telephone Encounter (Signed)
Okay, please give the order. Would check TSH about 2 months after the change.  Order is in for lab.

## 2017-05-08 NOTE — Telephone Encounter (Addendum)
Left detailed message on voicemail of cell phone.  Check later to see if patient has made lab appointment in 2 months (nonfasting).

## 2017-05-08 NOTE — Addendum Note (Signed)
Addended by: Tonia Ghent on: 05/08/2017 05:32 AM   Modules accepted: Orders

## 2017-05-15 ENCOUNTER — Encounter: Payer: Self-pay | Admitting: *Deleted

## 2017-05-15 NOTE — Telephone Encounter (Signed)
Letter mailed

## 2017-05-16 ENCOUNTER — Telehealth: Payer: Self-pay | Admitting: Family Medicine

## 2017-05-16 NOTE — Telephone Encounter (Signed)
Please have him drop off a copy of his recent labs.  We may need to change his levothyroxine dose anyway, based on the labs.  Thanks.

## 2017-05-16 NOTE — Telephone Encounter (Signed)
Patient called and said he has 2-3 months of his old medication left.  He has labs for his cpx on 09/26/17. Patient said he could get his labs checked then.  [place] [facility] [place] 05[place]03[place]. His TSH was 9.060.

## 2017-05-16 NOTE — Telephone Encounter (Signed)
Patient notified as instructed by telephone and verbalized understanding. Patient stated that he will drop a copy of his recent labs off.

## 2017-05-16 NOTE — Telephone Encounter (Signed)
Carlos Foster dropped off his lab results, he stated Dr. Damita Dunnings needed them to adjust his medication. Put on mail cart

## 2017-05-17 NOTE — Telephone Encounter (Signed)
I'll check the hard copy. Thanks.  

## 2017-05-21 ENCOUNTER — Encounter: Payer: Self-pay | Admitting: Family Medicine

## 2017-05-21 MED ORDER — LEVOTHYROXINE SODIUM 112 MCG PO TABS: ORAL_TABLET | ORAL | Status: DC

## 2017-05-21 NOTE — Telephone Encounter (Signed)
Lm on pts vm requesting a call back 

## 2017-05-21 NOTE — Telephone Encounter (Signed)
Notify pt.  Would increase replacement with thyroid med.   Take 1 tablet by mouth daily except for 1.5  tablets by mouth on Sunday and Wednesday.  Recheck TSH about 2 months after the change. Thanks.   Med list updated, order is in.

## 2017-05-21 NOTE — Addendum Note (Signed)
Addended by: Tonia Ghent on: 05/21/2017 12:33 AM   Modules accepted: Orders

## 2017-05-22 NOTE — Telephone Encounter (Signed)
Patient notified as instructed by telephone and verbalized understanding. Patient wanted to let Dr. Damita Dunnings know that he has already been taking one daily except 1 1/2 on Sunday.

## 2017-05-23 IMAGING — MR MR LUMBAR SPINE W/O CM
4 of 5 series · 19 of 48 positions shown · non-contrast
Comparison: None.

CLINICAL DATA: Left leg pain and numbness for 3 months

EXAM:
MRI LUMBAR SPINE WITHOUT CONTRAST
TECHNIQUE: Multiplanar, multisequence MR imaging of the lumbar spine was
performed. No intravenous contrast was administered.

[Series 4: T1 · sagittal · 4.0mm · 0.51mm/px · 3 of 16 slices shown (1 of 2)]
[im 1/16]
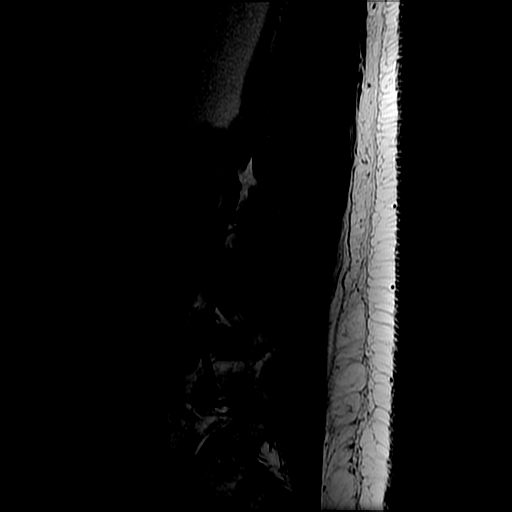
[im 8/16]
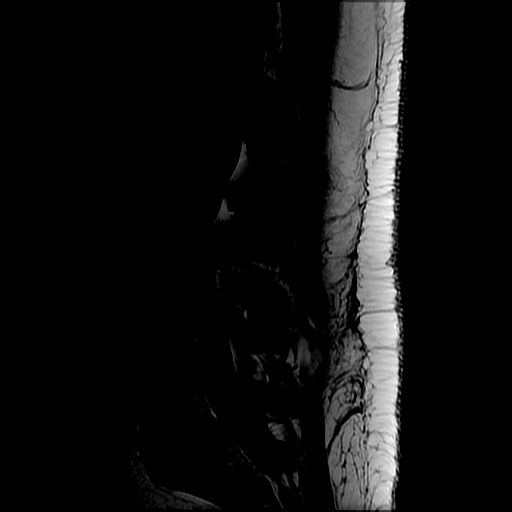
[im 16/16]
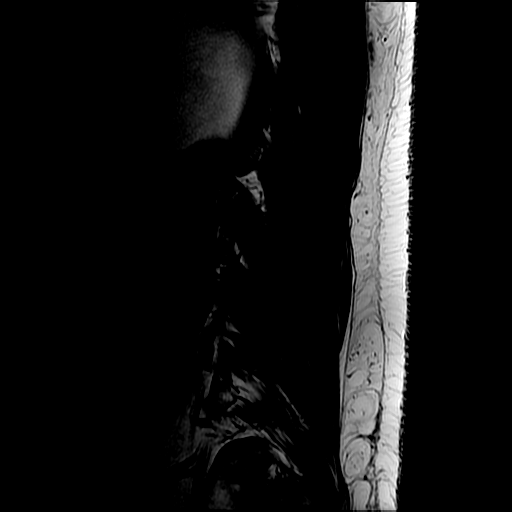

[Series 5: T2 post-contrast · sagittal · 4.0mm · 0.51mm/px · 5 of 16 slices shown]
[im 1/16]
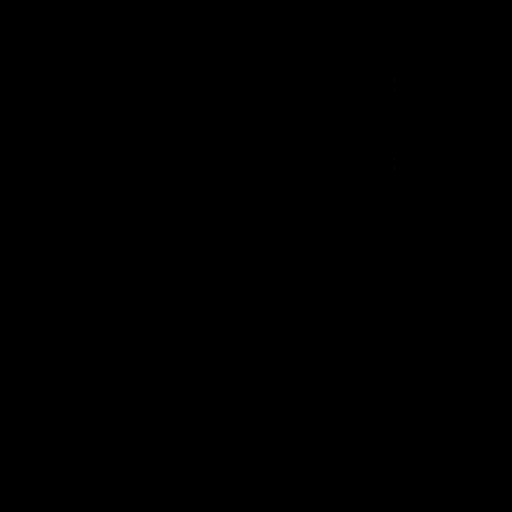
[im 4/16]
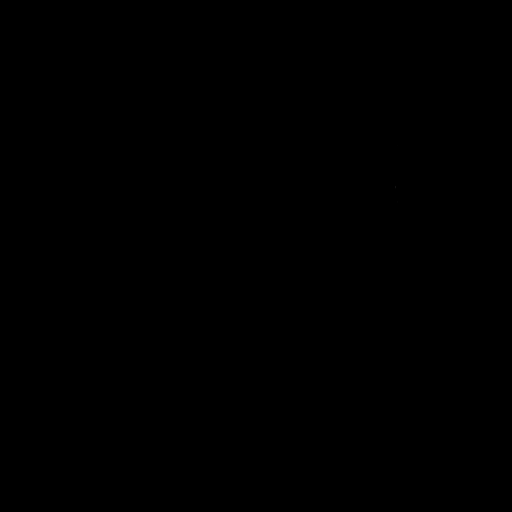
[im 8/16]
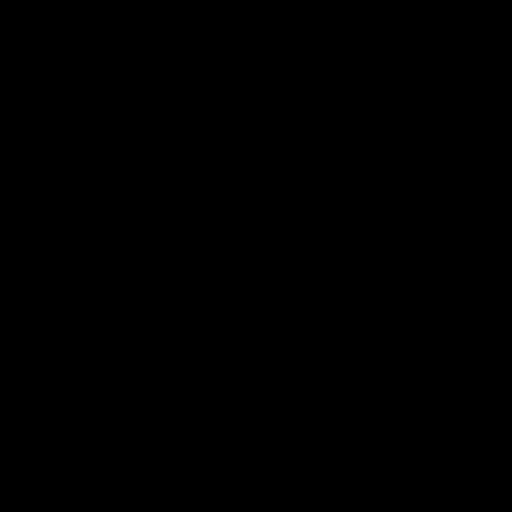
[im 12/16]
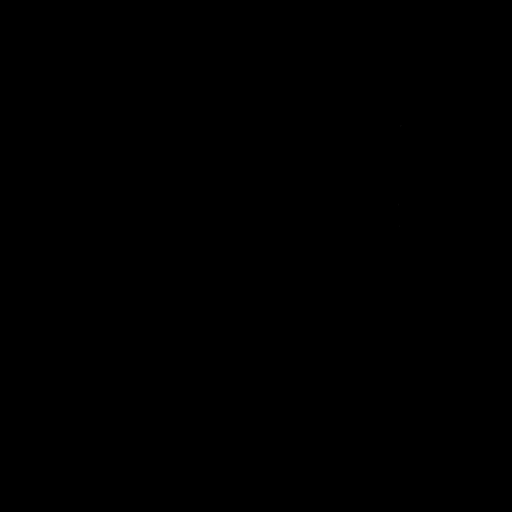
[im 16/16]
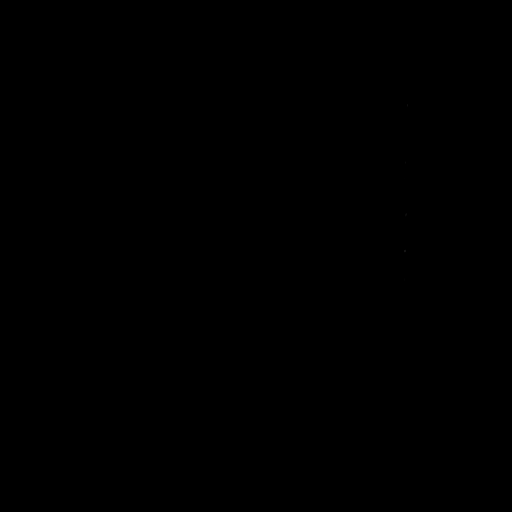

[Series 7: T2 · axial · 5.0mm · 0.39mm/px · z∈[-33,+146]mm · 8 of 45 slices shown]
[im 3/45]
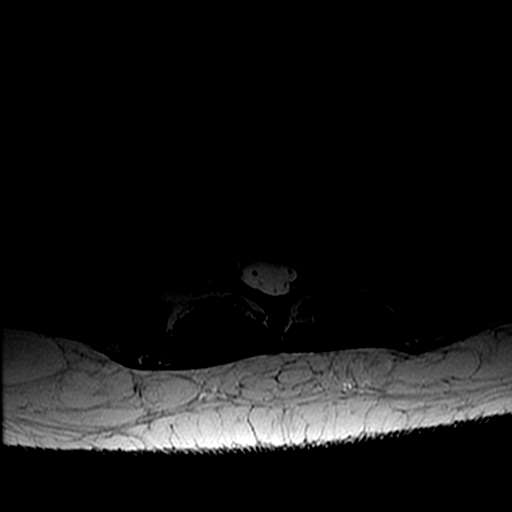
[im 6/45]
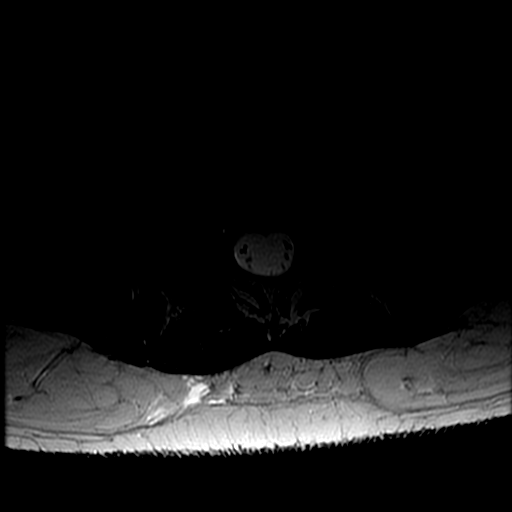
[im 9/45]
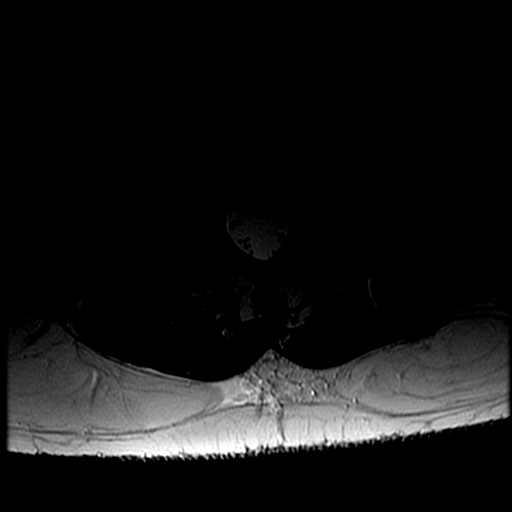
[im 15/45]
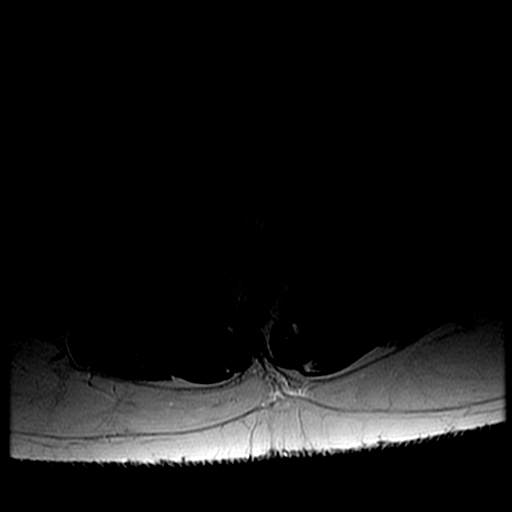
[im 21/45]
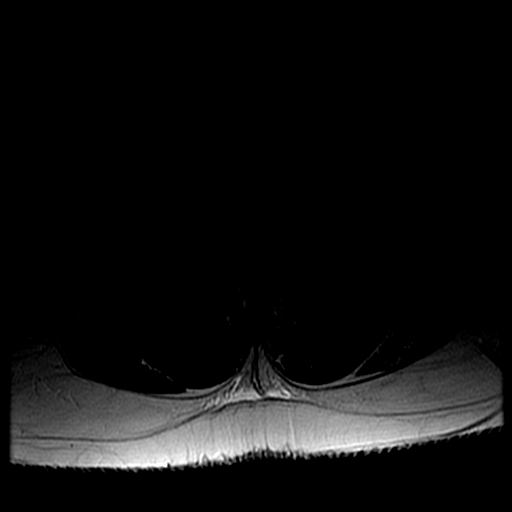
[im 24/45]
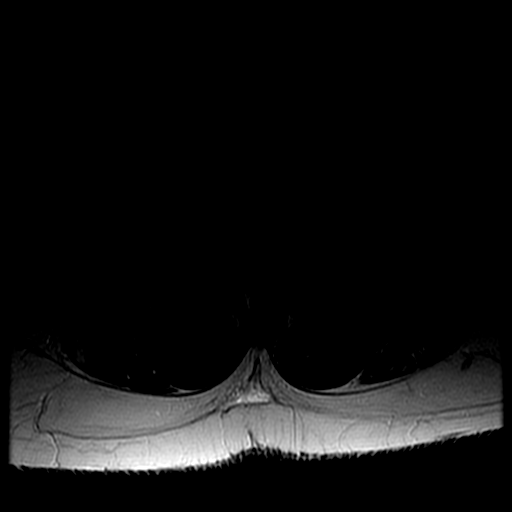
[im 27/45]
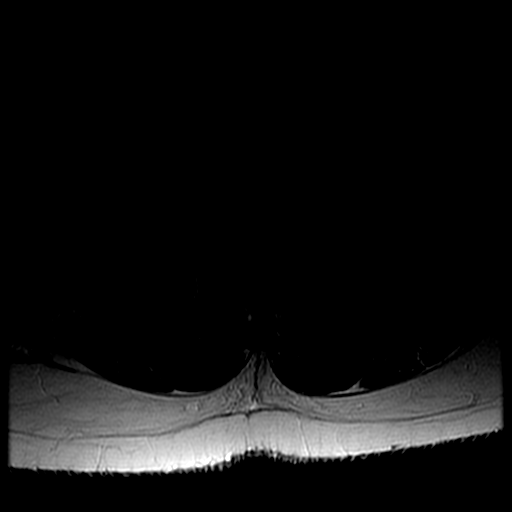
[im 39/45]
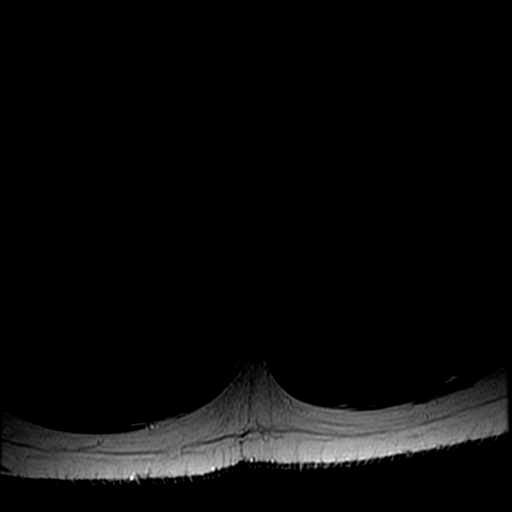

[Series 8: T1 · axial · 5.0mm · 0.39mm/px · z∈[-18,+146]mm · 3 of 45 slices shown (2 of 2)]
[im 6/45]
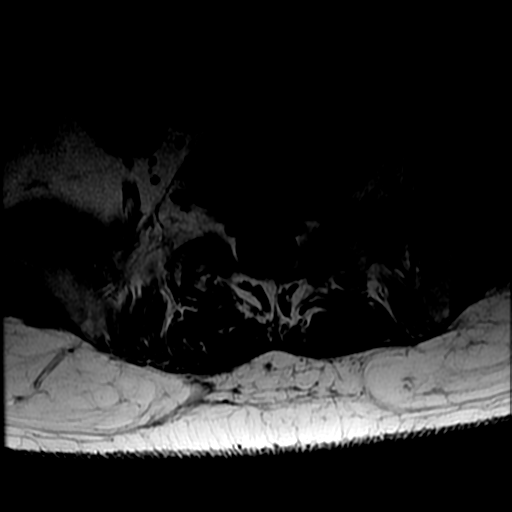
[im 24/45]
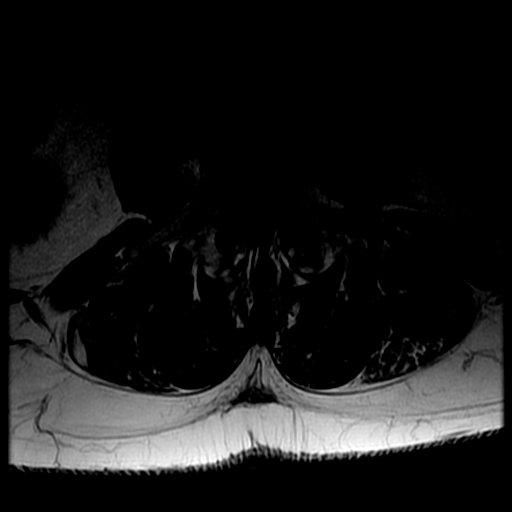
[im 39/45]
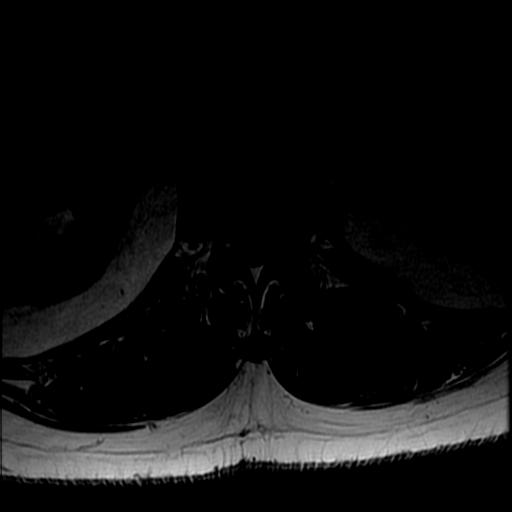

[19 of 48 positions shown; findings below may reference images not displayed]

FINDINGS: Segmentation:  Normal

Alignment:  Normal

Vertebrae: There is heterogeneous marrow signal throughout the
lumbar spine. There are type 2 Modic endplate changes at L1-L2.

Conus medullaris: Extends to the L1 level and appears normal.

Paraspinal and other soft tissues: The visualized aorta, IVC and
iliac vessels are normal. The visualized retroperitoneal organs and
paraspinal soft tissues are normal.

Disc levels:

T12-L1: Normal disc space and facets. No spinal canal or
neuroforaminal stenosis.

L1-L2: Right subarticular disc protrusion narrows the right lateral
recess. Mild right foraminal stenosis.

L2-L3: Disc desiccation with diffuse bulge and severe bilateral
facet hypertrophy with resultant moderate spinal canal stenosis.
Moderate right neural foraminal stenosis.

L3-L4: Diffuse disc bulge and severe bilateral facet hypertrophy. No
spinal canal stenosis. Mild bilateral foraminal stenosis.

L4-L5: Severe bilateral facet hypertrophy and mild disc bulge. Mild
right and moderate left foraminal stenosis.

L5-S1: Severe bilateral facet hypertrophy.  No stenosis.

Visualized sacrum: Normal.
IMPRESSION: 1. Multilevel severe facet arthrosis from L2-S1. This may serve as a
source of localized low back pain.
2. Moderate stenosis of the left L4-L5 neural foramen with possible
impingement of the exiting nerve root due to combination of disc
bulge and facet arthrosis. Correlate for left L4 radiculopathy.

## 2017-05-23 NOTE — Telephone Encounter (Signed)
Patient notified as instructed by telephone and verbalized understanding. 

## 2017-05-23 NOTE — Telephone Encounter (Signed)
Agreed, would still increase the dose and have him take 1 tablet by mouth daily except for 1.5  tablets by mouth on Sunday and Wednesday.  That would be an extra 1/2 tab per week, and that is the amount of increase that should help.  Recheck TSH about 2 months after the change. Thanks.

## 2017-08-03 ENCOUNTER — Other Ambulatory Visit: Payer: Self-pay | Admitting: Family Medicine

## 2017-08-03 ENCOUNTER — Other Ambulatory Visit (INDEPENDENT_AMBULATORY_CARE_PROVIDER_SITE_OTHER): Payer: BLUE CROSS/BLUE SHIELD

## 2017-08-03 DIAGNOSIS — E039 Hypothyroidism, unspecified: Secondary | ICD-10-CM

## 2017-08-03 LAB — TSH: TSH: 4.76 u[IU]/mL — ABNORMAL HIGH (ref 0.35–4.50)

## 2017-08-06 ENCOUNTER — Other Ambulatory Visit: Payer: Self-pay | Admitting: Family Medicine

## 2017-08-06 DIAGNOSIS — E038 Other specified hypothyroidism: Secondary | ICD-10-CM

## 2017-08-06 MED ORDER — LEVOTHYROXINE SODIUM 137 MCG PO TABS: 137.0000 ug | ORAL_TABLET | Freq: Every day | ORAL | 3 refills | Status: DC

## 2017-08-09 ENCOUNTER — Telehealth: Payer: Self-pay

## 2017-08-09 ENCOUNTER — Other Ambulatory Visit: Payer: Self-pay

## 2017-08-09 ENCOUNTER — Telehealth: Payer: Self-pay | Admitting: Family Medicine

## 2017-08-09 DIAGNOSIS — E038 Other specified hypothyroidism: Secondary | ICD-10-CM

## 2017-08-09 MED ORDER — LEVOTHYROXINE SODIUM 137 MCG PO TABS: 137.0000 ug | ORAL_TABLET | Freq: Every day | ORAL | 3 refills | Status: DC

## 2017-08-09 NOTE — Telephone Encounter (Signed)
Copied from [place]. Topic: Quick Communication - Rx Refill/Question >> Aug 09, 2017 10:24 AM Yvette Rack wrote: Medication: levothyroxine (SYNTHROID, LEVOTHROID) 137 MCG tablet This was already ordered but forgot to let provider know that he doesn't use [facility] only [facility]   Has the patient contacted their pharmacy? Yes.     (Agent: If no, request that the patient contact the pharmacy for the refill.)   Preferred Pharmacy (with phone number or street name): [id number] [place], [place] [phone] (Phone) [phone] (Fax     Agent: Please be advised that RX refills may take up to 3 business days. We ask that you follow-up with your pharmacy.

## 2017-08-09 NOTE — Telephone Encounter (Signed)
Copied from [place]. Topic: General - Other >> Aug 09, 2017 10:28 AM Carolyn Stare wrote:   RX  was sent to the wrong pharmacy and need the rx sent to   [facility] [address]  levothyroxine (SYNTHROID, LEVOTHROID) 137 MCG tablet

## 2017-08-10 ENCOUNTER — Other Ambulatory Visit: Payer: Self-pay | Admitting: *Deleted

## 2017-08-10 DIAGNOSIS — E038 Other specified hypothyroidism: Secondary | ICD-10-CM

## 2017-08-10 MED ORDER — LEVOTHYROXINE SODIUM 137 MCG PO TABS: 137.0000 ug | ORAL_TABLET | Freq: Every day | ORAL | 3 refills | Status: DC

## 2017-08-18 ENCOUNTER — Other Ambulatory Visit: Payer: Self-pay | Admitting: Family Medicine

## 2017-09-26 ENCOUNTER — Other Ambulatory Visit: Payer: BLUE CROSS/BLUE SHIELD

## 2017-10-01 ENCOUNTER — Other Ambulatory Visit: Payer: Self-pay | Admitting: Family Medicine

## 2017-10-01 DIAGNOSIS — I10 Essential (primary) hypertension: Secondary | ICD-10-CM

## 2017-10-03 ENCOUNTER — Encounter: Payer: BLUE CROSS/BLUE SHIELD | Admitting: Family Medicine

## 2017-10-04 ENCOUNTER — Other Ambulatory Visit (INDEPENDENT_AMBULATORY_CARE_PROVIDER_SITE_OTHER): Payer: BLUE CROSS/BLUE SHIELD

## 2017-10-04 DIAGNOSIS — I10 Essential (primary) hypertension: Secondary | ICD-10-CM | POA: Diagnosis not present

## 2017-10-04 DIAGNOSIS — E038 Other specified hypothyroidism: Secondary | ICD-10-CM

## 2017-10-04 LAB — COMPREHENSIVE METABOLIC PANEL
ALT: 25 U/L (ref 0–53)
AST: 18 U/L (ref 0–37)
Albumin: 4.4 g/dL (ref 3.5–5.2)
Alkaline Phosphatase: 80 U/L (ref 39–117)
BUN: 15 mg/dL (ref 6–23)
CO2: 28 mEq/L (ref 19–32)
Calcium: 9.8 mg/dL (ref 8.4–10.5)
Chloride: 106 mEq/L (ref 96–112)
Creatinine, Ser: 1.09 mg/dL (ref 0.40–1.50)
GFR: 72.21 mL/min (ref >60.00)
Glucose, Bld: 113 mg/dL — ABNORMAL HIGH (ref 70–99)
Potassium: 4 mEq/L (ref 3.5–5.1)
Sodium: 141 mEq/L (ref 135–145)
Total Bilirubin: 0.5 mg/dL (ref 0.2–1.2)
Total Protein: 7.1 g/dL (ref 6.0–8.3)

## 2017-10-04 LAB — LIPID PANEL
Cholesterol: 142 mg/dL (ref 0–200)
HDL: 54.2 mg/dL (ref >39.00)
LDL Cholesterol: 74 mg/dL (ref 0–99)
NonHDL: 88.23
Total CHOL/HDL Ratio: 3
Triglycerides: 69 mg/dL (ref 0.0–149.0)
VLDL: 13.8 mg/dL (ref 0.0–40.0)

## 2017-10-04 LAB — TSH: TSH: 1.38 u[IU]/mL (ref 0.35–4.50)

## 2017-10-08 ENCOUNTER — Encounter: Payer: Self-pay | Admitting: Family Medicine

## 2017-10-08 ENCOUNTER — Ambulatory Visit (INDEPENDENT_AMBULATORY_CARE_PROVIDER_SITE_OTHER): Payer: BLUE CROSS/BLUE SHIELD | Admitting: Family Medicine

## 2017-10-08 VITALS — BP 142/76 | HR 68 | Temp 98.5°F | Ht 74.0 in | Wt 289.2 lb

## 2017-10-08 DIAGNOSIS — E038 Other specified hypothyroidism: Secondary | ICD-10-CM

## 2017-10-08 DIAGNOSIS — R739 Hyperglycemia, unspecified: Secondary | ICD-10-CM

## 2017-10-08 DIAGNOSIS — I10 Essential (primary) hypertension: Secondary | ICD-10-CM

## 2017-10-08 DIAGNOSIS — R202 Paresthesia of skin: Secondary | ICD-10-CM

## 2017-10-08 DIAGNOSIS — M48061 Spinal stenosis, lumbar region without neurogenic claudication: Secondary | ICD-10-CM

## 2017-10-08 DIAGNOSIS — E785 Hyperlipidemia, unspecified: Secondary | ICD-10-CM

## 2017-10-08 DIAGNOSIS — Z0000 Encounter for general adult medical examination without abnormal findings: Secondary | ICD-10-CM | POA: Diagnosis not present

## 2017-10-08 DIAGNOSIS — Z7189 Other specified counseling: Secondary | ICD-10-CM

## 2017-10-08 DIAGNOSIS — I77811 Abdominal aortic ectasia: Secondary | ICD-10-CM

## 2017-10-08 MED ORDER — METRONIDAZOLE 1 % EX GEL: CUTANEOUS | Status: DC

## 2017-10-08 MED ORDER — QUINAPRIL HCL 40 MG PO TABS: 40.0000 mg | ORAL_TABLET | Freq: Every day | ORAL | 3 refills | Status: DC

## 2017-10-08 MED ORDER — POTASSIUM CHLORIDE CRYS ER 20 MEQ PO TBCR: EXTENDED_RELEASE_TABLET | ORAL | 3 refills | Status: DC

## 2017-10-08 MED ORDER — AMLODIPINE-ATORVASTATIN 10-10 MG PO TABS: 1.0000 | ORAL_TABLET | Freq: Every day | ORAL | 3 refills | Status: DC

## 2017-10-08 MED ORDER — SPIRONOLACTONE 25 MG PO TABS: 50.0000 mg | ORAL_TABLET | Freq: Every day | ORAL | 3 refills | Status: DC

## 2017-10-08 MED ORDER — LEVOTHYROXINE SODIUM 137 MCG PO TABS: 137.0000 ug | ORAL_TABLET | Freq: Every day | ORAL | 3 refills | Status: DC

## 2017-10-08 NOTE — Progress Notes (Signed)
CPE- See plan.  Routine anticipatory guidance given to patient.  See health maintenance.  The possibility exists that previously documented standard health maintenance information may have been brought forward from a previous encounter into this note.  If needed, that same information has been updated to reflect the current situation based on today's encounter.    Flu up to date Tetanus 2015  PNA due at 28  Shingles d/w pt, defer for now.  Backordered.   Colonoscopy 2011.  Noted prev with PSA elevation, it has resolved after abx treatment, he had f/u with urology in the past. PSA wnl 04/2017.   Living will d/w pt. Wife designated if patient were incapacitated.  Diet and exercise d/w pt, both are affected by recent surgery. He is doing the best he can. Discussed with patient.  He agrees.  "I'm getting back to it."   FH AAA- he has seen vascular and is due for f/u in 2020    His back pain is better.  The numbness is better.  D/w pt.  S/p surgery.  "I feel like I'm getting back to my normal self."    Hypothyroidism.  On 163mcg daily.  No neck mass. No dysphagia.  Compliant.    Hypertension:    Using medication without problems or lightheadedness: yes Chest pain with exertion: no Edema: yes, R>L, better with elevation Short of breath:no Labs d/w pt.  Sleeping on 2 pillow, at baseline.    Elevated Cholesterol: Using medications without problems:yes Muscle aches: not from statin.  Diet compliance: encouraged.  Exercise:encouraged  PMH and SH reviewed  Meds, vitals, and allergies reviewed.   ROS: Per HPI.  Unless specifically indicated otherwise in HPI, the patient denies:  General: fever. Eyes: acute vision changes ENT: sore throat Cardiovascular: chest pain Respiratory: SOB GI: vomiting GU: dysuria Musculoskeletal: acute back pain Derm: acute rash Neuro: acute motor dysfunction Psych: worsening mood Endocrine: polydipsia Heme: bleeding Allergy: hayfever  GEN: nad, alert  and oriented HEENT: mucous membranes moist NECK: supple w/o LA, no tmg CV: rrr. PULM: ctab, no inc wob ABD: soft, +bs EXT: R>L BLE edema SKIN: no acute rash

## 2017-10-08 NOTE — Patient Instructions (Signed)
Don't change your meds for now.  Update me as needed.  Take care.  Glad to see you.   

## 2017-10-09 NOTE — Assessment & Plan Note (Signed)
Continue work on diet and exercise, no change in meds.  He agrees. Labs d/w pt.

## 2017-10-09 NOTE — Assessment & Plan Note (Signed)
D/w pt about diet and exercise.

## 2017-10-09 NOTE — Assessment & Plan Note (Signed)
tsh wnl, continue as is.  No tmg.

## 2017-10-09 NOTE — Assessment & Plan Note (Signed)
Living will d/w pt.  Wife designated if patient were incapacitated.   ?

## 2017-10-09 NOTE — Assessment & Plan Note (Signed)
Flu up to date Tetanus 2015  PNA due at 40  Shingles d/w pt, defer for now.  Backordered.   Colonoscopy 2011.  Noted prev with PSA elevation, it has resolved after abx treatment, he had f/u with urology in the past. PSA wnl 04/2017.   Living will d/w pt. Wife designated if patient were incapacitated.  Diet and exercise d/w pt, both are affected by recent surgery. He is doing the best he can. Discussed with patient.  He agrees.  "I'm getting back to it."   FH AAA- he has seen vascular and is due for f/u in 2020

## 2017-10-09 NOTE — Assessment & Plan Note (Signed)
Due for f/u in 2020, d/w pt.

## 2017-10-09 NOTE — Assessment & Plan Note (Signed)
He is going to stop lyrica if not beneficial in the next month or two.  He didn't tolerate gabapentin.

## 2017-10-09 NOTE — Assessment & Plan Note (Addendum)
Continue work on diet and exercise, no change in meds.  He agrees. Labs d/w pt. he has multiple potential causes for the lower extremity edema noted.  Amlodipine could cause this.  Lyrica could contribute.  He is off gabapentin.  The swelling goes down with elevation.  I would observe for now.  If worse he will let me know.  His lungs are clear and he does not appear to be in failure.  Okay for outpatient follow-up.

## 2017-10-09 NOTE — Assessment & Plan Note (Signed)
His back pain is better.  The numbness is better.  D/w pt.  S/p surgery.  "I feel like I'm getting back to my normal self."

## 2018-03-19 ENCOUNTER — Encounter: Payer: Self-pay | Admitting: Family Medicine

## 2018-03-19 ENCOUNTER — Ambulatory Visit (INDEPENDENT_AMBULATORY_CARE_PROVIDER_SITE_OTHER): Payer: Medicare Other | Admitting: Family Medicine

## 2018-03-19 VITALS — BP 130/74 | HR 84 | Temp 98.4°F | Ht 74.0 in | Wt 294.0 lb

## 2018-03-19 DIAGNOSIS — I10 Essential (primary) hypertension: Secondary | ICD-10-CM | POA: Diagnosis not present

## 2018-03-19 DIAGNOSIS — R300 Dysuria: Secondary | ICD-10-CM | POA: Diagnosis not present

## 2018-03-19 LAB — POC URINALSYSI DIPSTICK (AUTOMATED)
Bilirubin, UA: NEGATIVE
Blood, UA: NEGATIVE
Glucose, UA: NEGATIVE
Ketones, UA: NEGATIVE
Nitrite, UA: NEGATIVE
Protein, UA: NEGATIVE
Spec Grav, UA: 1.025 (ref 1.010–1.025)
Urobilinogen, UA: 0.2 E.U./dL
pH, UA: 6 (ref 5.0–8.0)

## 2018-03-19 MED ORDER — SULFAMETHOXAZOLE-TRIMETHOPRIM 400-80 MG PO TABS: 1.0000 | ORAL_TABLET | Freq: Two times a day (BID) | ORAL | 0 refills | Status: DC

## 2018-03-19 NOTE — Patient Instructions (Signed)
Presumed prostatitis.  Stop doxy.  Change to septra for 4 weeks.  Update me as needed.  Urine culture pending.  Take care.  Glad to see you.  If the swelling continue after this then we may need to taper the amlodipine.

## 2018-03-19 NOTE — Progress Notes (Signed)
He has R>L leg edema at baseline.  Improved with elevation, none at the beginning of the day.  Still on baseline meds, inc amlodipine.   He felt well until had a fever on this past Friday, up to 101.  Nocturia, incomplete voiding, but no burning with urination.  Fever broke about 2 days ago.  No fevers since.  He had h/o prostatitis with sig PSA elevation with similar sx years ago.  No groin pain.  No pain sitting.    He restarted doxycycline in the meantime, just in case that would help.  That seemed to help.    His back pain is better and weaned off lyrica, totally off that med at this point.    He doesn't have URI sx o/w.  No cough.  No ST.    Meds, vitals, and allergies reviewed.   ROS: Per HPI unless specifically indicated in ROS section   nad ncat OP wnl mmm Neck supple, no LA rrr ctab  R>L leg edema.   Prostate not ttp  Skin well perfused.

## 2018-03-20 DIAGNOSIS — R300 Dysuria: Secondary | ICD-10-CM | POA: Insufficient documentation

## 2018-03-20 LAB — URINE CULTURE
MICRO NUMBER:: 90990808
Result:: NO GROWTH
SPECIMEN QUALITY:: ADEQUATE

## 2018-03-20 NOTE — Assessment & Plan Note (Signed)
With edema noted. If the swelling continues after this episode of presumed prostatitis then we may need to taper the amlodipine.  He will update me as needed.

## 2018-03-20 NOTE — Assessment & Plan Note (Signed)
Presumed prostatitis though his prostate is not tender now.  He is likely partially treated with doxycycline.  He also likely caught his symptoms relatively early and started treatment with antibiotics.  Discussed with patient about options.  Would not check PSA at this point as it would likely not change management.  He agrees.  Rationale discussed with patient.  Start Septra.  1 tab twice a day for 28 days.  Update me as needed in the meantime, especially if not improving or if worse.  Check urine culture though I expect it to be negative given his doxycycline use.  All questions answered to the best my ability and he agrees with plan.  Okay for outpatient follow-up.  See after visit summary.

## 2018-09-30 ENCOUNTER — Other Ambulatory Visit: Payer: Self-pay | Admitting: Family Medicine

## 2018-09-30 DIAGNOSIS — I77811 Abdominal aortic ectasia: Secondary | ICD-10-CM

## 2018-09-30 NOTE — Progress Notes (Signed)
Call pt.  Due for f/u aortic u/s.  I put in the order and he should get a call about that.  Thanks.   Left detailed message on voicemail.  Mike Craze, CMA  09/30/2018

## 2018-10-02 ENCOUNTER — Other Ambulatory Visit: Payer: Self-pay | Admitting: Family Medicine

## 2018-10-02 DIAGNOSIS — I77811 Abdominal aortic ectasia: Secondary | ICD-10-CM

## 2018-10-10 ENCOUNTER — Other Ambulatory Visit (INDEPENDENT_AMBULATORY_CARE_PROVIDER_SITE_OTHER): Payer: Medicare Other

## 2018-10-10 ENCOUNTER — Other Ambulatory Visit: Payer: Self-pay

## 2018-10-10 ENCOUNTER — Other Ambulatory Visit: Payer: Self-pay | Admitting: Family Medicine

## 2018-10-10 DIAGNOSIS — I10 Essential (primary) hypertension: Secondary | ICD-10-CM

## 2018-10-10 LAB — LIPID PANEL
Cholesterol: 138 mg/dL (ref 0–200)
HDL: 41.3 mg/dL (ref >39.00)
LDL Cholesterol: 81 mg/dL (ref 0–99)
NonHDL: 96.35
Total CHOL/HDL Ratio: 3
Triglycerides: 77 mg/dL (ref 0.0–149.0)
VLDL: 15.4 mg/dL (ref 0.0–40.0)

## 2018-10-10 LAB — COMPREHENSIVE METABOLIC PANEL
ALT: 29 U/L (ref 0–53)
AST: 22 U/L (ref 0–37)
Albumin: 4.3 g/dL (ref 3.5–5.2)
Alkaline Phosphatase: 78 U/L (ref 39–117)
BUN: 20 mg/dL (ref 6–23)
CO2: 26 mEq/L (ref 19–32)
Calcium: 9.1 mg/dL (ref 8.4–10.5)
Chloride: 105 mEq/L (ref 96–112)
Creatinine, Ser: 1.16 mg/dL (ref 0.40–1.50)
GFR: 63.04 mL/min (ref >60.00)
Glucose, Bld: 114 mg/dL — ABNORMAL HIGH (ref 70–99)
Potassium: 4.2 mEq/L (ref 3.5–5.1)
Sodium: 139 mEq/L (ref 135–145)
Total Bilirubin: 0.8 mg/dL (ref 0.2–1.2)
Total Protein: 6.7 g/dL (ref 6.0–8.3)

## 2018-10-10 LAB — TSH: TSH: 2.28 u[IU]/mL (ref 0.35–4.50)

## 2018-10-11 ENCOUNTER — Other Ambulatory Visit: Payer: BLUE CROSS/BLUE SHIELD

## 2018-10-17 ENCOUNTER — Encounter: Payer: Self-pay | Admitting: Family Medicine

## 2018-10-17 ENCOUNTER — Encounter: Payer: BLUE CROSS/BLUE SHIELD | Admitting: Family Medicine

## 2018-10-17 ENCOUNTER — Other Ambulatory Visit: Payer: Self-pay | Admitting: Family Medicine

## 2018-10-17 ENCOUNTER — Other Ambulatory Visit: Payer: Self-pay

## 2018-10-17 ENCOUNTER — Ambulatory Visit (INDEPENDENT_AMBULATORY_CARE_PROVIDER_SITE_OTHER): Payer: Medicare Other | Admitting: Family Medicine

## 2018-10-17 ENCOUNTER — Ambulatory Visit (INDEPENDENT_AMBULATORY_CARE_PROVIDER_SITE_OTHER)
Admission: RE | Admit: 2018-10-17 | Discharge: 2018-10-17 | Disposition: A | Discharge status: Home / Self Care | Payer: Medicare Other | Source: Ambulatory Visit | Attending: Family Medicine | Admitting: Family Medicine

## 2018-10-17 VITALS — BP 122/76 | HR 89 | Temp 98.1°F | Ht 74.0 in | Wt 295.6 lb

## 2018-10-17 DIAGNOSIS — I77811 Abdominal aortic ectasia: Secondary | ICD-10-CM

## 2018-10-17 DIAGNOSIS — I10 Essential (primary) hypertension: Secondary | ICD-10-CM

## 2018-10-17 DIAGNOSIS — E876 Hypokalemia: Secondary | ICD-10-CM

## 2018-10-17 DIAGNOSIS — Z0000 Encounter for general adult medical examination without abnormal findings: Secondary | ICD-10-CM | POA: Diagnosis not present

## 2018-10-17 DIAGNOSIS — E038 Other specified hypothyroidism: Secondary | ICD-10-CM | POA: Diagnosis not present

## 2018-10-17 DIAGNOSIS — Z7189 Other specified counseling: Secondary | ICD-10-CM

## 2018-10-17 DIAGNOSIS — M7732 Calcaneal spur, left foot: Secondary | ICD-10-CM | POA: Diagnosis not present

## 2018-10-17 DIAGNOSIS — M79672 Pain in left foot: Secondary | ICD-10-CM | POA: Diagnosis not present

## 2018-10-17 DIAGNOSIS — E785 Hyperlipidemia, unspecified: Secondary | ICD-10-CM | POA: Diagnosis not present

## 2018-10-17 MED ORDER — LEVOTHYROXINE SODIUM 137 MCG PO TABS: 137.0000 ug | ORAL_TABLET | Freq: Every day | ORAL | 3 refills | Status: DC

## 2018-10-17 MED ORDER — QUINAPRIL HCL 40 MG PO TABS: 40.0000 mg | ORAL_TABLET | Freq: Every day | ORAL | 3 refills | Status: DC

## 2018-10-17 MED ORDER — METRONIDAZOLE 1 % EX GEL: CUTANEOUS | 3 refills | Status: DC

## 2018-10-17 MED ORDER — AMLODIPINE-ATORVASTATIN 10-10 MG PO TABS: 1.0000 | ORAL_TABLET | Freq: Every day | ORAL | 3 refills | Status: DC

## 2018-10-17 MED ORDER — DOXYCYCLINE HYCLATE 100 MG PO CAPS: 100.0000 mg | ORAL_CAPSULE | Freq: Two times a day (BID) | ORAL | 2 refills | Status: DC | PRN

## 2018-10-17 MED ORDER — POTASSIUM CHLORIDE CRYS ER 20 MEQ PO TBCR: EXTENDED_RELEASE_TABLET | ORAL | 3 refills | Status: DC

## 2018-10-17 MED ORDER — SPIRONOLACTONE 25 MG PO TABS: 50.0000 mg | ORAL_TABLET | Freq: Every day | ORAL | 3 refills | Status: DC

## 2018-10-17 NOTE — Progress Notes (Signed)
I have personally reviewed the Medicare Annual Wellness questionnaire and have noted 1. The patient's medical and social history 2. Their use of alcohol, tobacco or illicit drugs 3. Their current medications and supplements 4. The patient's functional ability including ADL's, fall risks, home safety risks and hearing or visual             impairment. 5. Diet and physical activities 6. Evidence for depression or mood disorders  The patients weight, height, BMI have been recorded in the chart and visual acuity is per eye clinic.  I have made referrals, counseling and provided education to the patient based review of the above and I have provided the pt with a written personalized care plan for preventive services.  Provider list updated- see scanned forms.  Routine anticipatory guidance given to patient.  See health maintenance. The possibility exists that previously documented standard health maintenance information may have been brought forward from a previous encounter into this note.  If needed, that same information has been updated to reflect the current situation based on today's encounter.    Flu 2019 Shingles- d/w pt about shingrix, currently out of stock. PNA deferred for now.  He didn't feel well after prev PNA vaccine dose w/o allergic rxn and it makes sense to defer for now.  Tetanus 2015 Colonoscopy 2011 Prostate cancer screening- PSA wnl per outside testing.  Advance directive- wife designated if patient were incapacitated.  Cognitive function addressed- see scanned forms- and if abnormal then additional documentation follows.   Aortic ectasia/AAA screening pending.  Discussed with patient.  Awaiting follow-up results.  Passed hearing test, whisper intact.  EKG on chart from prev.    Vision screen.  Left eye 20/13, -2. Right eye 20/30, -1 Bilateral 20/15, -2.  He has expected variance in acuity given that he had his left eye corrected for far vision and his right eye  corrected for near vision with previous surgery.  Hypertension:    Using medication without problems or lightheadedness: yes Chest pain with exertion:no Edema: occ, mild, at night, resolved by next AM Short of breath:no Labs d/w pt.  History of hypokalemia noted.  His mother had similar.  This is longstanding and corrected with current replacement and medications.  Elevated Cholesterol: Using medications without problems: yes Muscle aches: no Diet compliance: encouraged.  Exercise:encouraged  Hypothyroidism. TSH wnl.  No neck mass.  Compliant.    He has L lateral ankle and dorsal foot pain with prev dorsal bruise.  Pain improved with ankle brace.  H/o toe fusion noted.  No pain now. Still using a brace.  No trauma o/w.   PMH and SH reviewed  Meds, vitals, and allergies reviewed.   ROS: Per HPI.  Unless specifically indicated otherwise in HPI, the patient denies:  General: fever. Eyes: acute vision changes ENT: sore throat Cardiovascular: chest pain Respiratory: SOB GI: vomiting GU: dysuria Musculoskeletal: acute back pain Derm: acute rash Neuro: acute motor dysfunction Psych: worsening mood Endocrine: polydipsia Heme: bleeding Allergy: hayfever  GEN: nad, alert and oriented HEENT: mucous membranes moist NECK: supple w/o LA CV: rrr. PULM: ctab, no inc wob ABD: soft, +bs EXT: no edema SKIN: no acute rash L foot with normal exam except for L 2nd toe fused at baseline.  Not ttp at midfoot, 5th MT, med and lat mal.  Normal DP pulse.  Normal toe flex and ext except fused L 2nd toe.

## 2018-10-17 NOTE — Patient Instructions (Signed)
Go to the lab on the way out.  We'll contact you with your xray report. I'll await the AAA ultrasound.   If your xray if fine, then okay to wean out of the brace as tolerated.  Update me as needed.  Take care.  Glad to see you.

## 2018-10-20 NOTE — Assessment & Plan Note (Signed)
History of hypokalemia noted.  His mother had similar.  This is longstanding and corrected with current replacement and medications.

## 2018-10-20 NOTE — Assessment & Plan Note (Signed)
Flu 2019 Shingles- d/w pt about shingrix, currently out of stock. PNA deferred for now.  He didn't feel well after prev PNA vaccine dose w/o allergic rxn and it makes sense to defer for now.  Tetanus 2015 Colonoscopy 2011 Prostate cancer screening- PSA wnl per outside testing.  Advance directive- wife designated if patient were incapacitated.  Cognitive function addressed- see scanned forms- and if abnormal then additional documentation follows.   AAA screening pending.  Discussed with patient.  Awaiting follow-up results.  Passed hearing test, whisper intact.  EKG on chart from prev.    Vision screen.  Left eye 20/13, -2. Right eye 20/30, -1 Bilateral 20/15, -2.  He has expected variance in acuity given that he had his left eye corrected for far vision and his right eye corrected for near vision with previous surgery.

## 2018-10-20 NOTE — Assessment & Plan Note (Signed)
Aortic ectasia/AAA screening pending.  Discussed with patient.  Awaiting follow-up results.

## 2018-10-20 NOTE — Assessment & Plan Note (Signed)
Reasonable control.  Continue work on diet and exercise.  Labs discussed with patient.  He agrees.

## 2018-10-20 NOTE — Assessment & Plan Note (Signed)
X-ray with no acute abnormality, okay to wean out of the brace as tolerated. Update me as needed.

## 2018-10-20 NOTE — Assessment & Plan Note (Signed)
Advance directive- wife designated if patient were incapacitated.  

## 2018-10-20 NOTE — Assessment & Plan Note (Signed)
TSH wnl.  No neck mass.  Compliant.  Continue as is.  He agrees.

## 2018-10-20 NOTE — Assessment & Plan Note (Signed)
Able to tolerate statin.  Continue work on diet and exercise.  Labs discussed with patient.  He agrees.

## 2018-11-07 ENCOUNTER — Other Ambulatory Visit (HOSPITAL_COMMUNITY): Payer: Medicare Other

## 2018-12-10 ENCOUNTER — Other Ambulatory Visit (HOSPITAL_COMMUNITY): Payer: Medicare Other

## 2018-12-11 ENCOUNTER — Encounter: Payer: Self-pay | Admitting: Family Medicine

## 2018-12-11 ENCOUNTER — Ambulatory Visit (INDEPENDENT_AMBULATORY_CARE_PROVIDER_SITE_OTHER): Payer: Medicare Other | Admitting: Family Medicine

## 2018-12-11 ENCOUNTER — Other Ambulatory Visit: Payer: Medicare Other

## 2018-12-11 DIAGNOSIS — R300 Dysuria: Secondary | ICD-10-CM | POA: Diagnosis not present

## 2018-12-11 DIAGNOSIS — R509 Fever, unspecified: Secondary | ICD-10-CM | POA: Diagnosis not present

## 2018-12-11 LAB — POC URINALSYSI DIPSTICK (AUTOMATED)
Bilirubin, UA: 1
Blood, UA: NEGATIVE
Glucose, UA: NEGATIVE
Nitrite, UA: NEGATIVE
Protein, UA: POSITIVE — AB
Spec Grav, UA: >=1.03 — AB (ref 1.010–1.025)
Urobilinogen, UA: 0.2 E.U./dL
pH, UA: 6 (ref 5.0–8.0)

## 2018-12-11 MED ORDER — SULFAMETHOXAZOLE-TRIMETHOPRIM 800-160 MG PO TABS: 1.0000 | ORAL_TABLET | Freq: Two times a day (BID) | ORAL | 0 refills | Status: DC

## 2018-12-11 NOTE — Patient Instructions (Signed)
Take the bactrim ds 1 pill twice daily for 28 days for presumed prostatitis  Drink lots of fluids Watch temperature  If symptoms worsen call us asap  If new symptoms (especially respiratory) also call us asap  I sent medicine to the pharmacy  We will culture your urine and also inform you when that returns

## 2018-12-11 NOTE — Progress Notes (Signed)
Virtual Visit via Video Note  I connected with Carlos Foster on 12/11/18 at 12:00 PM EDT by a video enabled telemedicine application and verified that I am speaking with the correct person using two identifiers.  Location: Patient: home Provider: office    I discussed the limitations of evaluation and management by telemedicine and the availability of in person appointments. The patient expressed understanding and agreed to proceed.  History of Present Illness: 68 yo pt of Dr Josefine Class with c/o fever   Started last night 10:00 with a fever  99.1  Low back hurt and he started having chills  Went up to 100.4  This am back to 99.9   L lower back pain across the buttocks -this is chronic  Pain was worse last night   Urine stream is weaker  Some frequency  No blood  A little nausea  Has been constipated lately   Feels pressure around prostate  No bladder pain  No burning to urinate - had a funny sensation    UA: Results for orders placed or performed in visit on 12/11/18  POCT Urinalysis Dipstick (Automated)  Result Value Ref Range   Color, UA Dark Yellow    Clarity, UA Clear    Glucose, UA Negative Negative   Bilirubin, UA 1 mg/dL    Ketones, UA Trace    Spec Grav, UA >=1.030 (A) 1.010 - 1.025   Blood, UA Negative    pH, UA 6.0 5.0 - 8.0   Protein, UA Positive (A) Negative   Urobilinogen, UA 0.2 0.2 or 1.0 E.U./dL   Nitrite, UA Negative    Leukocytes, UA Moderate (2+) (A) Negative    He is trying to drink more fluids   No hx of uti   Last visit to urology was 2-3 y ago   No cough or respiratory symptoms at all  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 Negative for diaphoresis and weight loss.  HENT: Negative for congestion, ear pain and sore throat.   Eyes: Negative for discharge and redness.  Respiratory: Negative for cough, sputum production and shortness of breath.   Cardiovascular: Negative for chest pain and palpitations.  Gastrointestinal: Positive for constipation. Negative for abdominal pain, blood in stool, heartburn and vomiting.  Genitourinary: Positive for dysuria, frequency and urgency. Negative for flank pain and hematuria.       Slow stream   Musculoskeletal: Positive for back pain.  Skin: Negative for itching and rash.  Neurological: Negative for dizziness and weakness.    Patient Active Problem List   Diagnosis Date Noted  . Fever 12/11/2018  . Dysuria 03/20/2018  . Spinal stenosis of lumbar region 03/02/2017  . Aortic ectasia, abdominal ([place]) 11/08/2016  . Dysphagia 07/22/2016  . Paresthesia 06/08/2016  . Left foot pain 11/26/2015  . Gynecomastia, male 02/26/2015  . Advance care planning 09/11/2014  . Hypokalemia 07/08/2012  . HLD (hyperlipidemia) 07/07/2011  .  Medicare welcome exam 07/07/2011  . Colon cancer screening 07/07/2011  . History of prostate biopsy 07/07/2011  . Hyperglycemia 05/15/2007  . Hypothyroidism 05/14/2007  . Essential hypertension 05/14/2007   Past Medical History:  Diagnosis Date  . Arthritis    feet  . GERD (gastroesophageal reflux disease)   . History of hiatal hernia   . Hyperlipidemia   . Hypertension   . Hypokalemia   . Hypothyroidism   . Rosacea   . Thyroid disease    Hypothyroidism   Past Surgical History:  Procedure Laterality Date  . C2 C3 Laminectomy  07/09/2016  . COLONOSCOPY    . ETT  04/15/03   wnl ST decreased < 27mm V6  . EVACUATION OF CERVICAL  HEMATOMA N/A 07/21/2016   Procedure: RE-EXPLORATION OF ANTERIOR CERVICAL WOUND AND EVACUATION OF CERVICAL HEMATOMA;  Surgeon: Kary Kos, MD;  Location: [place];  Service: Neurosurgery;  Laterality: N/A;  . HAMMER TOE SURGERY Left 01/06/2016   Procedure: LEFT SECOND HAMMER TOE CORRECTION;  Surgeon: Wylene Simmer, MD;  Location: [place];  Service: Orthopedics;  Laterality: Left;  . [place]  . HERNIA REPAIR Bilateral 2015   inguinal  . LAMINECTOMY  12/02/99   C4/5, 5/6, 6/7 Fusion (Dr. Joya Salm)  . LUMBAR LAMINECTOMY/DECOMPRESSION MICRODISCECTOMY Left 03/02/2017   Procedure: Laminectomy and Foraminotomy - L2-L3 - L3-L4 - left;  Surgeon: Kary Kos, MD;  Location: [place];  Service: Neurosurgery;  Laterality: Left;  . METATARSAL OSTEOTOMY Left 01/06/2016   Procedure: LEFT SECOND METATARSAL WEIL OSTEOTOMY;  Surgeon: Wylene Simmer, MD;  Location: [place];  Service: Orthopedics;  Laterality: Left;  Marland Kitchen Morton's Neuroma Repair Right 1982   foot  . PROSTATE BIOPSY  12/10/06   Benign Jeffie Pollock)   Social History   Tobacco Use  . Smoking status: Never Smoker  . Smokeless tobacco: Never Used  Substance Use Topics  . Alcohol use: No  . Drug use: No   Family History  Problem Relation Age of Onset  . Kidney disease Mother        Acute kidney failure  . Heart disease Mother        CAD  . Hypertension Mother   . Diabetes Mother        DM and diabetic retinopathy, toe amputation  . Diabetes Other   . Heart disease Father   . AAA (abdominal aortic aneurysm) Father   . Lymphoma Maternal Uncle        X 2  . Depression Neg Hx   . Alcohol abuse Neg Hx   . Drug abuse Neg Hx   . Stroke Neg Hx   . Prostate cancer Neg Hx   . Colon cancer Neg Hx    Allergies  Allergen Reactions  . Gabapentin Other (See Comments)    Leg swelling   Current Outpatient Medications on File Prior to Visit  Medication Sig Dispense Refill  . amlodipine-atorvastatin (CADUET) 10-10 MG  tablet Take 1 tablet by mouth daily. 90 tablet 3  . aspirin EC 81 MG tablet Take 81 mg by mouth daily.    Marland Kitchen doxycycline (VIBRAMYCIN) 100 MG capsule Take 1 capsule (100 mg total) by mouth 2 (two) times daily as needed. For rosacea flare ups 60 capsule 2  . levothyroxine (SYNTHROID, LEVOTHROID) 137 MCG tablet Take 1 tablet (137 mcg total) by mouth daily before breakfast. 90 tablet 3  . metroNIDAZOLE (METROGEL) 1 % gel Apply 1 application  topically daily as needed for  rosacea. 45 g 3  . Polyethyl Glycol-Propyl Glycol (LUBRICANT EYE DROPS) 0.4-0.3 % SOLN Place 1 drop into both eyes 3 (three) times daily as needed (for dry eyes/contact irritation).    . potassium chloride SA (KLOR-CON M20) 20 MEQ tablet TAKE 2 TABLETS BY MOUTH TWO TIMES DAILY 360 tablet 3  . quinapril (ACCUPRIL) 40 MG tablet Take 1 tablet (40 mg total) by mouth at bedtime. 90 tablet 3  . spironolactone (ALDACTONE) 25 MG tablet Take 2 tablets (50 mg total) by mouth daily. 180 tablet 3   No current facility-administered medications on file prior to visit.     Observations/Objective: Patient appears well, in no distress/ does not appear ill  Weight is baseline (overweight) No facial swelling or asymmetry Normal voice-not hoarse and no slurred speech No obvious tremor or mobility impairment Moving neck and UEs normally Able to hear the call well  Talkative and mentally sharp with no cognitive changes No skin changes on face or neck , no rash or pallor Affect is normal /pleasant    Assessment and Plan: Problem List Items Addressed This Visit      Other   Dysuria    Presumed repeat prostatitis with slow voiding/pelvic fullness and fever  ua demonstrates protein and leukocytes and bilirubin (is also concentrated)  Px septra ds bid for 28 d (as was done last- worked well)  Will also culture urine Visit was virtual so exam not done today  Given fever- pt was inst to let us know immediately if any new symptoms (especially  respiratory) in light of the current covid pandemic He will not return to work until temp is normal and his symptoms are better as well  Update if not starting to improve in a week or if worsening        Relevant Orders   Urine Culture   Fever - Primary    Given voiding symptoms (and recurrence of prostatitis) I suspect this  ua is pos (in the past ucx is neg but will send another)  Reviewed his hx (pt of Dr Damita Dunnings)  Px bactrim ds bid for 28 days Enc inc in fluid intake (urine is concentrated)  Strongly enc pt to call immediately if symptoms worsen or if any new ones (respiratory especially)  Out of work until full resolution of symptoms and nl temp  If no improvement would push for covid 19 testing (or if respiratory symptoms or other changes) Update if not starting to improve in a week or if worsening        Relevant Orders   POCT Urinalysis Dipstick (Automated) (Completed)   Urine Culture       Follow Up Instructions: Take the bactrim ds 1 pill twice daily for 28 days for presumed prostatitis  Drink lots of fluids Watch temperature  If symptoms worsen call us asap  If new symptoms (especially respiratory) also call us asap  I sent medicine to the pharmacy  We will culture your urine and also inform you when that returns    I discussed the assessment and treatment plan with the patient. The patient was provided an opportunity to ask questions and all were answered. The patient agreed with the plan and demonstrated an understanding of the instructions.   The patient was advised to call back or seek an in-person evaluation if the symptoms worsen or if the condition fails to improve as anticipated.     Loura Pardon, MD

## 2018-12-11 NOTE — Assessment & Plan Note (Signed)
Presumed repeat prostatitis with slow voiding/pelvic fullness and fever  ua demonstrates protein and leukocytes and bilirubin (is also concentrated)  Px septra ds bid for 28 d (as was done last- worked well)  Will also culture urine Visit was virtual so exam not done today  Given fever- pt was inst to let us know immediately if any new symptoms (especially respiratory) in light of the current covid pandemic He will not return to work until temp is normal and his symptoms are better as well  Update if not starting to improve in a week or if worsening

## 2018-12-11 NOTE — Assessment & Plan Note (Signed)
Given voiding symptoms (and recurrence of prostatitis) I suspect this  ua is pos (in the past ucx is neg but will send another)  Reviewed his hx (pt of Dr Damita Dunnings)  Px bactrim ds bid for 28 days Enc inc in fluid intake (urine is concentrated)  Strongly enc pt to call immediately if symptoms worsen or if any new ones (respiratory especially)  Out of work until full resolution of symptoms and nl temp  If no improvement would push for covid 19 testing (or if respiratory symptoms or other changes) Update if not starting to improve in a week or if worsening

## 2018-12-12 LAB — URINE CULTURE
MICRO NUMBER:: 470742
Result:: NO GROWTH
SPECIMEN QUALITY:: ADEQUATE

## 2018-12-26 ENCOUNTER — Telehealth: Payer: Self-pay | Admitting: Family Medicine

## 2018-12-26 DIAGNOSIS — R739 Hyperglycemia, unspecified: Secondary | ICD-10-CM

## 2018-12-26 NOTE — Telephone Encounter (Signed)
Best number [phone] Pt called wanting to see if you could order an A1C, he had dot physical today and his blood sugar was 121  Cut off for this is 120.

## 2018-12-27 NOTE — Telephone Encounter (Signed)
Ordered.  Needs nonfasting lab visit. Thanks.

## 2018-12-27 NOTE — Telephone Encounter (Signed)
Pt scheduled for 12/30/18 @ 9:05am. No covid 19 symptoms

## 2018-12-30 ENCOUNTER — Other Ambulatory Visit (INDEPENDENT_AMBULATORY_CARE_PROVIDER_SITE_OTHER): Payer: Medicare Other

## 2018-12-30 DIAGNOSIS — R739 Hyperglycemia, unspecified: Secondary | ICD-10-CM

## 2018-12-30 LAB — POCT GLYCOSYLATED HEMOGLOBIN (HGB A1C): Hemoglobin A1C: 5.7 % — AB (ref 4.0–5.6)

## 2019-02-07 ENCOUNTER — Ambulatory Visit (HOSPITAL_COMMUNITY)
Admission: RE | Admit: 2019-02-07 | Discharge: 2019-02-07 | Disposition: A | Discharge status: Home / Self Care | Payer: Medicare Other | Source: Ambulatory Visit | Attending: Internal Medicine | Admitting: Internal Medicine

## 2019-02-07 ENCOUNTER — Other Ambulatory Visit: Payer: Self-pay

## 2019-02-07 DIAGNOSIS — I77811 Abdominal aortic ectasia: Secondary | ICD-10-CM

## 2019-05-08 LAB — LIPID PANEL
Cholesterol: 137 (ref 0–200)
HDL: 36 (ref 35–70)
LDL Cholesterol: 85
Triglycerides: 80 (ref 40–160)

## 2019-05-08 LAB — HEPATIC FUNCTION PANEL
ALT: 40 (ref 10–40)
AST: 37 (ref 14–40)

## 2019-05-08 LAB — PSA: PSA: 4.2

## 2019-05-08 LAB — BASIC METABOLIC PANEL
Creatinine: 1.1 (ref 0.6–1.3)
Glucose: 114

## 2019-05-08 LAB — TSH: TSH: 7.25 — AB (ref 0.41–5.90)

## 2019-06-16 ENCOUNTER — Telehealth: Payer: Self-pay

## 2019-06-16 NOTE — Telephone Encounter (Signed)
Patient calls in to request a prescription for cyclobenzaprine 10mg  which he has taken before for back pain following his surgery 2 years ago.   Patient states that he has had progressing back pain with radicular symptoms down leg and that the cyclobenzaprine has helped him in the past.   As Dr. Damita Dunnings has not seen him for this issue, he did make an appointment for 3pm tomorrow to see him to evaluate and prescribe necessary treatment.   If Dr. Damita Dunnings does not feel he needs to see him, he asks that we send his r/x in to [employer] and call him to cancel the appointment.   Patient knows that if he doesn't hear from [place] he will be here 68mins early to check in for his 3pm appointment with Dr. Damita Dunnings on Tuesday 06/17/19.   Thanks.

## 2019-06-17 ENCOUNTER — Encounter: Payer: Self-pay | Admitting: Family Medicine

## 2019-06-17 ENCOUNTER — Other Ambulatory Visit: Payer: Self-pay

## 2019-06-17 ENCOUNTER — Ambulatory Visit (INDEPENDENT_AMBULATORY_CARE_PROVIDER_SITE_OTHER): Payer: Medicare Other | Admitting: Family Medicine

## 2019-06-17 VITALS — BP 158/88 | HR 93 | Temp 98.0°F | Ht 74.0 in | Wt 299.6 lb

## 2019-06-17 DIAGNOSIS — Z125 Encounter for screening for malignant neoplasm of prostate: Secondary | ICD-10-CM

## 2019-06-17 DIAGNOSIS — E038 Other specified hypothyroidism: Secondary | ICD-10-CM | POA: Diagnosis not present

## 2019-06-17 DIAGNOSIS — R972 Elevated prostate specific antigen [PSA]: Secondary | ICD-10-CM | POA: Diagnosis not present

## 2019-06-17 DIAGNOSIS — M549 Dorsalgia, unspecified: Secondary | ICD-10-CM | POA: Diagnosis not present

## 2019-06-17 MED ORDER — CYCLOBENZAPRINE HCL 10 MG PO TABS: 10.0000 mg | ORAL_TABLET | Freq: Three times a day (TID) | ORAL | 1 refills | Status: DC | PRN

## 2019-06-17 MED ORDER — LEVOTHYROXINE SODIUM 150 MCG PO TABS: 150.0000 ug | ORAL_TABLET | Freq: Every day | ORAL | 3 refills | Status: DC

## 2019-06-17 NOTE — Patient Instructions (Addendum)
TSH and PSA in about 2 months at nonfasting lab visit.  Please schedule on the way out.   Update me as needed.   Change to the higher dose of the thyroid medicine.  Take care.  Glad to see you.

## 2019-06-17 NOTE — Telephone Encounter (Signed)
rx sent.  I am okay with him trying the medicine first.  Okay with me to cancel/defer the appointment if patient prefers, but I would always be glad to see the patient.  Thanks.

## 2019-06-17 NOTE — Telephone Encounter (Signed)
Patient advised and states he has some lab work from the [employer] that has some abnormal readings on it and he will keep the appointment and discuss the lab work at that time.

## 2019-06-17 NOTE — Progress Notes (Signed)
His back pain is clearly better with flexeril.  Aleve wasn't helping.  D/w pt.   No trauma.  L sided radicular pain.  More pain with prolonged standing.  He is better than prev (with flexeril) and he'll update me as needed.  No FCNAVD, no weakness.  No B/B sx.    Labs done at work.    PSA 4.2.  H/o presumed prostatitis this year.  He improved on abx prev.  No burning with urination.  Variable stream.  Neg prostate bx 2008.  Neg FH.    TSH 7.2.  Compliant.  No ADE on med.  No dysphagia, no neck mass.    Meds, vitals, and allergies reviewed.   ROS: Per HPI unless specifically indicated in ROS section   GEN: nad, alert and oriented HEENT: ncat NECK: supple w/o LA CV: rrr.   PULM: ctab, no inc wob ABD: soft, +bs EXT: no edema SKIN: Well-perfused

## 2019-06-18 DIAGNOSIS — R972 Elevated prostate specific antigen [PSA]: Secondary | ICD-10-CM | POA: Insufficient documentation

## 2019-06-18 DIAGNOSIS — M549 Dorsalgia, unspecified: Secondary | ICD-10-CM | POA: Insufficient documentation

## 2019-06-18 NOTE — Assessment & Plan Note (Signed)
Can use Flexeril with sedation caution and update me as needed.  He is improved in the meantime.

## 2019-06-18 NOTE — Assessment & Plan Note (Signed)
Increase replacement to 150 mcg a day and recheck TSH in about 2 months.  He agrees.

## 2019-06-18 NOTE — Assessment & Plan Note (Signed)
H/o presumed prostatitis this year.  He improved on abx prev.  No burning with urination.  Variable stream.  Neg prostate bx 2008.  Neg FH.   We can recheck PSA in about 2 months.  His PSA may normalize on recheck.  He agrees.

## 2019-08-04 ENCOUNTER — Other Ambulatory Visit: Payer: Self-pay | Admitting: Family Medicine

## 2019-08-06 ENCOUNTER — Telehealth: Payer: Self-pay

## 2019-08-06 NOTE — Telephone Encounter (Signed)
I put the letter in the chart.  It should be available via MyChart.  My understanding is that aspirin is not a contraindication for the vaccine.  I do not see any evidence of contraindication for the Covid vaccine for this patient.

## 2019-08-06 NOTE — Telephone Encounter (Signed)
Patient advised and was able to see the letter in [facility]

## 2019-08-06 NOTE — Telephone Encounter (Signed)
Pt left v/m that he has appt to have covid vaccine on 08/06/18 at 1 PM as a first responder. Pt said due to him taking ASA 81 mg (considered a blood thinner) pt needs a letter from Dr Damita Dunnings giving permission to take the vaccine. I spoke with pt and since pt needs letter by tomorrow request to put letter in mychart so pt can print letter off. Please advise. Pt will ck mychart later today.

## 2019-08-19 ENCOUNTER — Other Ambulatory Visit (INDEPENDENT_AMBULATORY_CARE_PROVIDER_SITE_OTHER): Payer: Medicare Other

## 2019-08-19 DIAGNOSIS — Z125 Encounter for screening for malignant neoplasm of prostate: Secondary | ICD-10-CM | POA: Diagnosis not present

## 2019-08-19 DIAGNOSIS — R972 Elevated prostate specific antigen [PSA]: Secondary | ICD-10-CM

## 2019-08-19 DIAGNOSIS — E038 Other specified hypothyroidism: Secondary | ICD-10-CM | POA: Diagnosis not present

## 2019-08-20 LAB — PSA, MEDICARE: PSA: 4.92 ng/ml — ABNORMAL HIGH (ref 0.10–4.00)

## 2019-08-20 LAB — TSH: TSH: 3.85 u[IU]/mL (ref 0.35–4.50)

## 2019-09-08 ENCOUNTER — Other Ambulatory Visit: Payer: Self-pay | Admitting: Family Medicine

## 2019-09-25 ENCOUNTER — Other Ambulatory Visit: Payer: Self-pay | Admitting: Urology

## 2019-09-25 DIAGNOSIS — R3912 Poor urinary stream: Secondary | ICD-10-CM | POA: Diagnosis not present

## 2019-09-25 DIAGNOSIS — R3911 Hesitancy of micturition: Secondary | ICD-10-CM | POA: Diagnosis not present

## 2019-09-25 DIAGNOSIS — N401 Enlarged prostate with lower urinary tract symptoms: Secondary | ICD-10-CM | POA: Diagnosis not present

## 2019-09-25 DIAGNOSIS — R972 Elevated prostate specific antigen [PSA]: Secondary | ICD-10-CM | POA: Diagnosis not present

## 2019-09-25 DIAGNOSIS — R351 Nocturia: Secondary | ICD-10-CM | POA: Diagnosis not present

## 2019-10-06 ENCOUNTER — Encounter: Payer: Self-pay | Admitting: Gastroenterology

## 2019-10-14 ENCOUNTER — Other Ambulatory Visit: Payer: Self-pay | Admitting: Family Medicine

## 2019-10-14 DIAGNOSIS — I10 Essential (primary) hypertension: Secondary | ICD-10-CM

## 2019-10-16 ENCOUNTER — Other Ambulatory Visit (INDEPENDENT_AMBULATORY_CARE_PROVIDER_SITE_OTHER): Payer: Medicare Other

## 2019-10-16 ENCOUNTER — Ambulatory Visit (INDEPENDENT_AMBULATORY_CARE_PROVIDER_SITE_OTHER): Payer: Medicare Other

## 2019-10-16 ENCOUNTER — Other Ambulatory Visit: Payer: Self-pay

## 2019-10-16 VITALS — BP 144/88 | Wt 290.0 lb

## 2019-10-16 DIAGNOSIS — I10 Essential (primary) hypertension: Secondary | ICD-10-CM

## 2019-10-16 DIAGNOSIS — Z0000 Encounter for general adult medical examination without abnormal findings: Secondary | ICD-10-CM

## 2019-10-16 LAB — COMPREHENSIVE METABOLIC PANEL
ALT: 28 U/L (ref 0–53)
AST: 23 U/L (ref 0–37)
Albumin: 4.3 g/dL (ref 3.5–5.2)
Alkaline Phosphatase: 89 U/L (ref 39–117)
BUN: 15 mg/dL (ref 6–23)
CO2: 30 mEq/L (ref 19–32)
Calcium: 9.3 mg/dL (ref 8.4–10.5)
Chloride: 104 mEq/L (ref 96–112)
Creatinine, Ser: 1.09 mg/dL (ref 0.40–1.50)
GFR: 67.52 mL/min (ref >60.00)
Glucose, Bld: 116 mg/dL — ABNORMAL HIGH (ref 70–99)
Potassium: 3.9 mEq/L (ref 3.5–5.1)
Sodium: 139 mEq/L (ref 135–145)
Total Bilirubin: 1 mg/dL (ref 0.2–1.2)
Total Protein: 6.9 g/dL (ref 6.0–8.3)

## 2019-10-16 LAB — LIPID PANEL
Cholesterol: 129 mg/dL (ref 0–200)
HDL: 39.3 mg/dL (ref >39.00)
LDL Cholesterol: 73 mg/dL (ref 0–99)
NonHDL: 89.25
Total CHOL/HDL Ratio: 3
Triglycerides: 79 mg/dL (ref 0.0–149.0)
VLDL: 15.8 mg/dL (ref 0.0–40.0)

## 2019-10-16 NOTE — Progress Notes (Signed)
Subjective:   Carlos Foster is a 69 y.o. male who presents for Medicare Annual/Subsequent preventive examination.  Review of Systems: N/A   This visit is being conducted through telemedicine via telephone at the nurse health advisor's home address due to the COVID-19 pandemic. This patient has given me verbal consent via doximity to conduct this visit, patient states they are participating from their home address. Patient and myself are on the telephone call. There is no referral for this visit. Some vital signs may be absent or patient reported.    Patient identification: identified by name, DOB, and current address   Cardiac Risk Factors include: advanced age (>28men, >38 women);male gender;hypertension;dyslipidemia     Objective:    Vitals: BP (!) 144/88   Wt 290 lb (131.5 kg)   BMI 37.23 kg/m   Body mass index is 37.23 kg/m.  Advanced Directives 10/16/2019 03/01/2017 11/24/2016 07/22/2016 07/21/2016 01/06/2016 12/31/2015  Does Patient Have a Medical Advance Directive? Yes Yes Yes Yes Yes Yes Yes  Type of Paramedic of [place];Living will [place];Living will [place];Living will Living will Living will - Living will  Does patient want to make changes to medical advance directive? - - - No - Patient declined - - -  Copy of [place] in Chart? No - copy requested No - copy requested No - copy requested - - - -    Tobacco Social History   Tobacco Use  Smoking Status Never Smoker  Smokeless Tobacco Never Used     Counseling given: Not Answered   Clinical Intake:  Pre-visit preparation completed: Yes  Pain : 0-10 Pain Score: 7  Pain Type: Chronic pain Pain Location: Back Pain Orientation: Lower Pain Descriptors / Indicators: Aching Pain Onset: More than a month ago Pain Frequency: Intermittent     Nutritional Risks: None Diabetes: No  How often do you need to have someone help you  when you read instructions, pamphlets, or other written materials from your doctor or pharmacy?: 1 - Never What is the last grade level you completed in school?: Bachelors  Interpreter Needed?: No  Information entered by :: cJohnson, LPN  Past Medical History:  Diagnosis Date  . Arthritis    feet  . GERD (gastroesophageal reflux disease)   . History of hiatal hernia   . Hyperlipidemia   . Hypertension   . Hypokalemia   . Hypothyroidism   . Rosacea   . Thyroid disease    Hypothyroidism   Past Surgical History:  Procedure Laterality Date  . C2 C3 Laminectomy  07/09/2016  . COLONOSCOPY    . ETT  04/15/03   wnl ST decreased < 66mm V6  . EVACUATION OF CERVICAL HEMATOMA N/A 07/21/2016   Procedure: RE-EXPLORATION OF ANTERIOR CERVICAL WOUND AND EVACUATION OF CERVICAL HEMATOMA;  Surgeon: Kary Kos, MD;  Location: [facility];  Service: Neurosurgery;  Laterality: N/A;  . HAMMER TOE SURGERY Left 01/06/2016   Procedure: LEFT SECOND HAMMER TOE CORRECTION;  Surgeon: Wylene Simmer, MD;  Location: [facility];  Service: Orthopedics;  Laterality: Left;  . L[place]  . HERNIA REPAIR Bilateral 2015   inguinal  . LAMINECTOMY  12/02/99   C4/5, 5/6, 6/7 Fusion (Dr. Joya Salm)  . LUMBAR LAMINECTOMY/DECOMPRESSION MICRODISCECTOMY Left 03/02/2017   Procedure: Laminectomy and Foraminotomy - L2-L3 - L3-L4 - left;  Surgeon: Kary Kos, MD;  Location: [facility];  Service: Neurosurgery;  Laterality: Left;  . METATARSAL  OSTEOTOMY Left 01/06/2016   Procedure: LEFT SECOND METATARSAL WEIL OSTEOTOMY;  Surgeon: Wylene Simmer, MD;  Location: [facility];  Service: Orthopedics;  Laterality: Left;  Marland Kitchen Morton's Neuroma Repair Right 1982   foot  . PROSTATE BIOPSY  12/10/06   Benign Jeffie Pollock)   Family History  Problem Relation Age of Onset  . Kidney disease Mother        Acute kidney failure  . Heart disease Mother        CAD  . Hypertension Mother   . Diabetes Mother        DM and diabetic  retinopathy, toe amputation  . Diabetes Other   . Heart disease Father   . AAA (abdominal aortic aneurysm) Father   . Lymphoma Maternal Uncle        X 2  . Depression Neg Hx   . Alcohol abuse Neg Hx   . Drug abuse Neg Hx   . Stroke Neg Hx   . Prostate cancer Neg Hx   . Colon cancer Neg Hx    Social History   Socioeconomic History  . Marital status: Married    Spouse name: Not on file  . Number of children: 1  . Years of education: Not on file  . Highest education level: Not on file  Occupational History  . 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 Smoking status: Never Smoker  . Smokeless tobacco: Never Used  Substance and Sexual Activity  . Alcohol use: No  . Drug use: No  . Sexual activity: Not on file  Other Topics Concern  . 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 Difficulty of Paying Living Expenses: Not hard at all  Food Insecurity: No Food Insecurity  . Worried About Charity fundraiser in the Last Year: Never true  . Ran Out of Food in the Last Year: Never true  Transportation Needs: No Transportation Needs  . Lack of Transportation (Medical): No  . Lack of Transportation (Non-Medical): No  Physical Activity: Inactive  . Days of Exercise per Week: 0 days  . Minutes of Exercise per Session: 0 min  Stress: No Stress Concern Present  . Feeling of Stress : Not at all  Social Connections:   . Frequency of Communication with Friends and Family:   . Frequency of Social Gatherings with Friends and Family:   . Attends Religious Services:   . Active Member of Clubs or Organizations:   . Attends Archivist Meetings:   Marland Kitchen Marital Status:     Outpatient  Encounter Medications as of 10/16/2019  Medication Sig  . amlodipine-atorvastatin (CADUET) 10-10 MG tablet TAKE 1 TABLET BY MOUTH ONCE A DAY  . aspirin EC 81 MG tablet Take 81 mg by mouth daily.  . cyclobenzaprine (FLEXERIL) 10 MG tablet Take 1 tablet (10 mg total) by mouth 3 (three) times daily as needed for muscle spasms (Sedation caution).  Marland Kitchen doxycycline (VIBRAMYCIN) 100 MG capsule Take 1 capsule (100 mg total) by mouth 2 (two) times daily as needed. For rosacea flare ups  . levothyroxine (SYNTHROID) 150 MCG tablet Take 1 tablet (150  mcg total) by mouth daily before breakfast.  . metroNIDAZOLE (METROGEL) 1 % gel Apply 1 application  topically daily as needed for rosacea.  Vladimir Faster Glycol-Propyl Glycol (LUBRICANT EYE DROPS) 0.4-0.3 % SOLN Place 1 drop into both eyes 3 (three) times daily as needed (for dry eyes/contact irritation).  . potassium chloride SA (KLOR-CON) 20 MEQ tablet TAKE 2 TABLETS BY MOUTH TWICE A DAY  . quinapril (ACCUPRIL) 40 MG tablet TAKE 1 TABLET BY MOUTH EVERY NIGHT AT BEDTIME  . spironolactone (ALDACTONE) 25 MG tablet TAKE 2 TABLETS BY MOUTH ONCE A DAY   No facility-administered encounter medications on file as of 10/16/2019.    Activities of Daily Living In your present state of health, do you have any difficulty performing the following activities: 10/16/2019  Hearing? N  Vision? N  Difficulty concentrating or making decisions? N  Walking or climbing stairs? N  Dressing or bathing? N  Doing errands, shopping? N  Preparing Food and eating ? N  Using the Toilet? N  In the past six months, have you accidently leaked urine? N  Do you have problems with loss of bowel control? N  Managing your Medications? N  Managing your Finances? N  Housekeeping or managing your Housekeeping? N  Some recent data might be hidden    Patient Care Team: Tonia Ghent, MD as PCP - General   Assessment:   This is a routine wellness examination for [place].  Exercise Activities  and Dietary recommendations Current Exercise Habits: The patient does not participate in regular exercise at present, Exercise limited by: None identified  Goals    . Patient Stated     10/16/2019, I will maintain and continue medications as prescribed.        Fall Risk Fall Risk  10/16/2019  Falls in the past year? 0  Number falls in past yr: 0  Injury with Fall? 0  Risk for fall due to : Medication side effect  Follow up Falls prevention discussed;Falls evaluation completed   Is the patient's home free of loose throw rugs in walkways, pet beds, electrical cords, etc?   yes      Grab bars in the bathroom? no      Handrails on the stairs?   yes      Adequate lighting?   yes  Timed Get Up and Go Performed: N/A  Depression Screen PHQ 2/9 Scores 10/16/2019  PHQ - 2 Score 0  PHQ- 9 Score 0    Cognitive Function MMSE - Mini Mental State Exam 10/16/2019  Orientation to time 5  Orientation to Place 5  Registration 3  Attention/ Calculation 5  Recall 3  Language- repeat 1       Mini Cog  Mini-Cog screen was completed. Maximum score is 22. A value of 0 denotes this part of the MMSE was not completed or the patient failed this part of the Mini-Cog screening.  Immunization History  Administered Date(s) Administered  . Fluad Quad(high Dose 65+) 05/01/2019  . HPV Bivalent 05/04/2016  . Influenza Split 07/06/2011, 06/07/2012  . Influenza Whole 04/30/2006, 05/30/2007, 04/23/2009, 03/31/2010  . Influenza-Unspecified 05/14/2013, 03/31/2014, 04/01/2015, 05/18/2016, 05/03/2017  . Pneumococcal Polysaccharide-23 06/21/2010  . Td 04/01/2003  . Tdap 09/10/2013    Qualifies for Shingles Vaccine: yes  Screening Tests Health Maintenance  Topic Date Due  . COLONOSCOPY  09/25/2019  . PNA vac Low Risk Adult (1 of 2 - PCV13) 10/15/2020 (Originally 12/30/2017)  . TETANUS/TDAP  09/11/2023  . INFLUENZA VACCINE  Completed  . Hepatitis C Screening  Completed   Cancer Screenings: Lung:  Low Dose CT Chest recommended if Age 71-80 years, 30 pack-year currently smoking OR have quit w/in 15 years. Patient does not qualify. Colorectal: scheduled for April 16th per patient   Additional Screenings:  Hepatitis C Screening: 11/19/2015      Plan:    Patient will maintain and continue medications as prescribed.   I have personally reviewed and noted the following in the patient's chart:   . Medical and social history . Use of alcohol, tobacco or illicit drugs  . Current medications and supplements . Functional ability and status . Nutritional status . Physical activity . Advanced directives . List of other physicians . Hospitalizations, surgeries, and ER visits in previous 12 months . Vitals . Screenings to include cognitive, depression, and falls . Referrals and appointments  In addition, I have reviewed and discussed with patient certain preventive protocols, quality metrics, and best practice recommendations. A written personalized care plan for preventive services as well as general preventive health recommendations were provided to patient.     Andrez Grime, LPN  [phone]

## 2019-10-16 NOTE — Patient Instructions (Signed)
Carlos Foster , Thank you for taking time to come for your Medicare Wellness Visit. I appreciate your ongoing commitment to your health goals. Please review the following plan we discussed and let me know if I can assist you in the future.   Screening recommendations/referrals: Colonoscopy: scheduled 11/14/2019 Recommended yearly ophthalmology/optometry visit for glaucoma screening and checkup Recommended yearly dental visit for hygiene and checkup  Vaccinations: Influenza vaccine: Up to date, completed 05/01/2019 Pneumococcal vaccine: declined Tdap vaccine: Up to date, completed 09/10/2013 Shingles vaccine: discussed    Advanced directives: Please bring a copy of your POA (Power of Carlos Foster) and/or Living Will to your next appointment.   Conditions/risks identified: hyperlipidemia, hypertension  Next appointment: 10/23/2019 @ 2:15 pm   Preventive Care 65 Years and Older, Male Preventive care refers to lifestyle choices and visits with your health care provider that can promote health and wellness. What does preventive care include?  A yearly physical exam. This is also called an annual well check.  Dental exams once or twice a year.  Routine eye exams. Ask your health care provider how often you should have your eyes checked.  Personal lifestyle choices, including:  Daily care of your teeth and gums.  Regular physical activity.  Eating a healthy diet.  Avoiding tobacco and drug use.  Limiting alcohol use.  Practicing safe sex.  Taking low doses of aspirin every day.  Taking vitamin and mineral supplements as recommended by your health care provider. What happens during an annual well check? The services and screenings done by your health care provider during your annual well check will depend on your age, overall health, lifestyle risk factors, and family history of disease. Counseling  Your health care provider may ask you questions about your:  Alcohol use.  Tobacco  use.  Drug use.  Emotional well-being.  Home and relationship well-being.  Sexual activity.  Eating habits.  History of falls.  Memory and ability to understand (cognition).  Work and work Statistician. Screening  You may have the following tests or measurements:  Height, weight, and BMI.  Blood pressure.  Lipid and cholesterol levels. These may be checked every 5 years, or more frequently if you are over 62 years old.  Skin check.  Lung cancer screening. You may have this screening every year starting at age 26 if you have a 30-pack-year history of smoking and currently smoke or have quit within the past 15 years.  Fecal occult blood test (FOBT) of the stool. You may have this test every year starting at age 14.  Flexible sigmoidoscopy or colonoscopy. You may have a sigmoidoscopy every 5 years or a colonoscopy every 10 years starting at age 38.  Prostate cancer screening. Recommendations will vary depending on your family history and other risks.  Hepatitis C blood test.  Hepatitis B blood test.  Sexually transmitted disease (STD) testing.  Diabetes screening. This is done by checking your blood sugar (glucose) after you have not eaten for a while (fasting). You may have this done every 1-3 years.  Abdominal aortic aneurysm (AAA) screening. You may need this if you are a current or former smoker.  Osteoporosis. You may be screened starting at age 59 if you are at high risk. Talk with your health care provider about your test results, treatment options, and if necessary, the need for more tests. Vaccines  Your health care provider may recommend certain vaccines, such as:  Influenza vaccine. This is recommended every year.  Tetanus, diphtheria, and acellular  pertussis (Tdap, Td) vaccine. You may need a Td booster every 10 years.  Zoster vaccine. You may need this after age 60.  Pneumococcal 13-valent conjugate (PCV13) vaccine. One dose is recommended after age  58.  Pneumococcal polysaccharide (PPSV23) vaccine. One dose is recommended after age 57. Talk to your health care provider about which screenings and vaccines you need and how often you need them. This information is not intended to replace advice given to you by your health care provider. Make sure you discuss any questions you have with your health care provider. Document Released: 08/13/2015 Document Revised: 04/05/2016 Document Reviewed: 05/18/2015 Elsevier Interactive Patient Education  2017 [place] Prevention in the Home Falls can cause injuries. They can happen to people of all ages. There are many things you can do to make your home safe and to help prevent falls. What can I do on the outside of my home?  Regularly fix the edges of walkways and driveways and fix any cracks.  Remove anything that might make you trip as you walk through a door, such as a raised step or threshold.  Trim any bushes or trees on the path to your home.  Use bright outdoor lighting.  Clear any walking paths of anything that might make someone trip, such as rocks or tools.  Regularly check to see if handrails are loose or broken. Make sure that both sides of any steps have handrails.  Any raised decks and porches should have guardrails on the edges.  Have any leaves, snow, or ice cleared regularly.  Use sand or salt on walking paths during winter.  Clean up any spills in your garage right away. This includes oil or grease spills. What can I do in the bathroom?  Use night lights.  Install grab bars by the toilet and in the tub and shower. Do not use towel bars as grab bars.  Use non-skid mats or decals in the tub or shower.  If you need to sit down in the shower, use a plastic, non-slip stool.  Keep the floor dry. Clean up any water that spills on the floor as soon as it happens.  Remove soap buildup in the tub or shower regularly.  Attach bath mats securely with double-sided  non-slip rug tape.  Do not have throw rugs and other things on the floor that can make you trip. What can I do in the bedroom?  Use night lights.  Make sure that you have a light by your bed that is easy to reach.  Do not use any sheets or blankets that are too big for your bed. They should not hang down onto the floor.  Have a firm chair that has side arms. You can use this for support while you get dressed.  Do not have throw rugs and other things on the floor that can make you trip. What can I do in the kitchen?  Clean up any spills right away.  Avoid walking on wet floors.  Keep items that you use a lot in easy-to-reach places.  If you need to reach something above you, use a strong step stool that has a grab bar.  Keep electrical cords out of the way.  Do not use floor polish or wax that makes floors slippery. If you must use wax, use non-skid floor wax.  Do not have throw rugs and other things on the floor that can make you trip. What can I do with my stairs?  Do not leave any items on the stairs.  Make sure that there are handrails on both sides of the stairs and use them. Fix handrails that are broken or loose. Make sure that handrails are as long as the stairways.  Check any carpeting to make sure that it is firmly attached to the stairs. Fix any carpet that is loose or worn.  Avoid having throw rugs at the top or bottom of the stairs. If you do have throw rugs, attach them to the floor with carpet tape.  Make sure that you have a light switch at the top of the stairs and the bottom of the stairs. If you do not have them, ask someone to add them for you. What else can I do to help prevent falls?  Wear shoes that:  Do not have high heels.  Have rubber bottoms.  Are comfortable and fit you well.  Are closed at the toe. Do not wear sandals.  If you use a stepladder:  Make sure that it is fully opened. Do not climb a closed stepladder.  Make sure that both  sides of the stepladder are locked into place.  Ask someone to hold it for you, if possible.  Clearly mark and make sure that you can see:  Any grab bars or handrails.  First and last steps.  Where the edge of each step is.  Use tools that help you move around (mobility aids) if they are needed. These include:  Canes.  Walkers.  Scooters.  Crutches.  Turn on the lights when you go into a dark area. Replace any light bulbs as soon as they burn out.  Set up your furniture so you have a clear path. Avoid moving your furniture around.  If any of your floors are uneven, fix them.  If there are any pets around you, be aware of where they are.  Review your medicines with your doctor. Some medicines can make you feel dizzy. This can increase your chance of falling. Ask your doctor what other things that you can do to help prevent falls. This information is not intended to replace advice given to you by your health care provider. Make sure you discuss any questions you have with your health care provider. Document Released: 05/13/2009 Document Revised: 12/23/2015 Document Reviewed: 08/21/2014 Elsevier Interactive Patient Education  2017 [employer].

## 2019-10-16 NOTE — Progress Notes (Signed)
PCP notes:  Health Maintenance: Prevnar 13- declined Colonoscopy- scheduled April 16th MRI due to PSA scheduled for March 26 per patient   Abnormal Screenings: none   Patient concerns: Discuss lower back pain    Nurse concerns: none   Next PCP appt.: 10/23/2019 @ 2:15 pm

## 2019-10-23 ENCOUNTER — Other Ambulatory Visit: Payer: Medicare Other

## 2019-10-23 ENCOUNTER — Ambulatory Visit: Payer: Medicare Other | Admitting: Family Medicine

## 2019-10-24 ENCOUNTER — Ambulatory Visit
Admission: RE | Admit: 2019-10-24 | Discharge: 2019-10-24 | Disposition: A | Discharge status: Home / Self Care | Payer: Medicare Other | Source: Ambulatory Visit | Attending: Urology | Admitting: Urology

## 2019-10-24 ENCOUNTER — Other Ambulatory Visit: Payer: Self-pay

## 2019-10-24 DIAGNOSIS — R972 Elevated prostate specific antigen [PSA]: Secondary | ICD-10-CM | POA: Diagnosis not present

## 2019-10-24 MED ORDER — GADOBENATE DIMEGLUMINE 529 MG/ML IV SOLN
20.0000 mL | Freq: Once | INTRAVENOUS | Status: AC | PRN
  Administered 2019-10-24: 18:00:00 20 mL via INTRAVENOUS

## 2019-10-29 DIAGNOSIS — R351 Nocturia: Secondary | ICD-10-CM | POA: Diagnosis not present

## 2019-10-29 DIAGNOSIS — R3121 Asymptomatic microscopic hematuria: Secondary | ICD-10-CM | POA: Diagnosis not present

## 2019-10-29 DIAGNOSIS — N401 Enlarged prostate with lower urinary tract symptoms: Secondary | ICD-10-CM | POA: Diagnosis not present

## 2019-10-29 DIAGNOSIS — R972 Elevated prostate specific antigen [PSA]: Secondary | ICD-10-CM | POA: Diagnosis not present

## 2019-11-03 ENCOUNTER — Ambulatory Visit (INDEPENDENT_AMBULATORY_CARE_PROVIDER_SITE_OTHER)
Admission: RE | Admit: 2019-11-03 | Discharge: 2019-11-03 | Disposition: A | Discharge status: Home / Self Care | Payer: Medicare Other | Source: Ambulatory Visit | Attending: Family Medicine | Admitting: Family Medicine

## 2019-11-03 ENCOUNTER — Encounter: Payer: Self-pay | Admitting: Family Medicine

## 2019-11-03 ENCOUNTER — Ambulatory Visit (INDEPENDENT_AMBULATORY_CARE_PROVIDER_SITE_OTHER): Payer: Medicare Other | Admitting: Family Medicine

## 2019-11-03 ENCOUNTER — Other Ambulatory Visit: Payer: Self-pay

## 2019-11-03 VITALS — BP 122/80 | HR 103 | Temp 97.4°F | Ht 74.0 in | Wt 295.4 lb

## 2019-11-03 DIAGNOSIS — M5410 Radiculopathy, site unspecified: Secondary | ICD-10-CM | POA: Diagnosis not present

## 2019-11-03 DIAGNOSIS — M549 Dorsalgia, unspecified: Secondary | ICD-10-CM | POA: Diagnosis not present

## 2019-11-03 DIAGNOSIS — Z0000 Encounter for general adult medical examination without abnormal findings: Secondary | ICD-10-CM

## 2019-11-03 DIAGNOSIS — L719 Rosacea, unspecified: Secondary | ICD-10-CM | POA: Diagnosis not present

## 2019-11-03 DIAGNOSIS — R972 Elevated prostate specific antigen [PSA]: Secondary | ICD-10-CM

## 2019-11-03 DIAGNOSIS — E785 Hyperlipidemia, unspecified: Secondary | ICD-10-CM

## 2019-11-03 DIAGNOSIS — M545 Low back pain: Secondary | ICD-10-CM | POA: Diagnosis not present

## 2019-11-03 DIAGNOSIS — I77811 Abdominal aortic ectasia: Secondary | ICD-10-CM

## 2019-11-03 DIAGNOSIS — Z7189 Other specified counseling: Secondary | ICD-10-CM

## 2019-11-03 DIAGNOSIS — E038 Other specified hypothyroidism: Secondary | ICD-10-CM

## 2019-11-03 DIAGNOSIS — I10 Essential (primary) hypertension: Secondary | ICD-10-CM | POA: Diagnosis not present

## 2019-11-03 MED ORDER — LEVOTHYROXINE SODIUM 150 MCG PO TABS: 150.0000 ug | ORAL_TABLET | Freq: Every day | ORAL | 3 refills | Status: DC

## 2019-11-03 MED ORDER — DOXYCYCLINE HYCLATE 100 MG PO CAPS: 100.0000 mg | ORAL_CAPSULE | Freq: Two times a day (BID) | ORAL | 3 refills | Status: DC | PRN

## 2019-11-03 MED ORDER — AMLODIPINE-ATORVASTATIN 10-10 MG PO TABS: 1.0000 | ORAL_TABLET | Freq: Every day | ORAL | 3 refills | Status: DC

## 2019-11-03 MED ORDER — METRONIDAZOLE 1 % EX GEL: CUTANEOUS | 3 refills | Status: DC

## 2019-11-03 MED ORDER — POTASSIUM CHLORIDE CRYS ER 20 MEQ PO TBCR: EXTENDED_RELEASE_TABLET | ORAL | 3 refills | Status: DC

## 2019-11-03 MED ORDER — QUINAPRIL HCL 40 MG PO TABS: 40.0000 mg | ORAL_TABLET | Freq: Every day | ORAL | 3 refills | Status: DC

## 2019-11-03 MED ORDER — CYCLOBENZAPRINE HCL 10 MG PO TABS: 10.0000 mg | ORAL_TABLET | Freq: Every evening | ORAL | 5 refills | Status: DC | PRN

## 2019-11-03 MED ORDER — SPIRONOLACTONE 25 MG PO TABS: 50.0000 mg | ORAL_TABLET | Freq: Every day | ORAL | 3 refills | Status: DC

## 2019-11-03 NOTE — Patient Instructions (Signed)
Go to the lab on the way out.   If you have mychart we'll likely use that to update you.    Continue flexeril at night but try to taper aleve.  If to you take aleve, take with food.  Continue to drink plenty of water.  Take care.  Glad to see you.

## 2019-11-03 NOTE — Progress Notes (Signed)
This visit occurred during the SARS-CoV-2 public health emergency.  Safety protocols were in place, including screening questions prior to the visit, additional usage of staff PPE, and extensive cleaning of exam room while observing appropriate contact time as indicated for disinfecting solutions.  Flu 2020 Shingles- d/w pt about shingrix.   PNA d/w pt.  He wanted to defer at this point, but can get this done as some point.   covid vaccine 2021.   Tetanus 2015 Colonoscopy pending 2021 Prostate cancer screening- PSA per outside testing.  Advance directive- wife designated if patient were incapacitated.   Hypertension:    Using medication without problems or lightheadedness: yes Chest pain with exertion:no Edema: only if on his feet all day.   Short of breath: no Still on amlodipine and spironolactone and quinapril, d/w pt.  Still on potassium.  K wnl.  D/w pt.   Elevated Cholesterol: Using medications without problems: yes Muscle aches: no Diet compliance: encouraged.   Exercise: encouraged.   Still on statin.   On doxy prn for rosacea flares.  Needed refill on doxy.  No ADE on med.  Cautions d/w pt.    H/o back surgery.  More pain recently.  He had cut back on aleve prev but had more pain, tried taking flexeril at night in the meantime.  He prev had L leg numbness in L leg with prolonged standing.  He has been taking aleve 2 tabs BID.  No new pain, no new sx.  No trigger trauma recently.  No B/B sx.  No weakness.    PSA elevated prev.  MRI done:  No findings suspicious for macroscopic prostate cancer on MR. PI-RADS 1. Enlargement/nodularity central gland, compatible with BPH. Calculated prostate volume 100 mL.  He had incidental blood in urine with w/u pending per urology, with CT pending.  I'll defer.  He agrees.   He isn't due for f/u aortic imaging, d/w pt.    PMH and SH reviewed  Meds, vitals, and allergies reviewed.   ROS: Per HPI.  Unless specifically indicated  otherwise in HPI, the patient denies:  General: fever. Eyes: acute vision changes ENT: sore throat Cardiovascular: chest pain Respiratory: SOB GI: vomiting GU: dysuria Musculoskeletal: acute back pain Derm: acute rash Neuro: acute motor dysfunction Psych: worsening mood Endocrine: polydipsia Heme: bleeding Allergy: hayfever  GEN: nad, alert and oriented HEENT: ncat NECK: supple w/o LA CV: rrr. PULM: ctab, no inc wob ABD: soft, +bs EXT: no edema SKIN: no acute rash

## 2019-11-05 DIAGNOSIS — L719 Rosacea, unspecified: Secondary | ICD-10-CM | POA: Insufficient documentation

## 2019-11-05 DIAGNOSIS — Z0000 Encounter for general adult medical examination without abnormal findings: Secondary | ICD-10-CM | POA: Insufficient documentation

## 2019-11-05 NOTE — Assessment & Plan Note (Signed)
No sx.  He isn't due for f/u aortic imaging, d/w pt.

## 2019-11-05 NOTE — Assessment & Plan Note (Signed)
No change in meds.  Labs d/w pt.  Continue work on diet and exercise.  He agrees.

## 2019-11-05 NOTE — Assessment & Plan Note (Signed)
  Flu 2020 Shingles- d/w pt about shingrix.   PNA d/w pt.  He wanted to defer at this point, but can get this done as some point.   covid vaccine 2021.   Tetanus 2015 Colonoscopy pending 2021 Prostate cancer screening- PSA per outside testing.  Advance directive- wife designated if patient were incapacitated.

## 2019-11-05 NOTE — Assessment & Plan Note (Signed)
Hx of.  He had incidental blood in urine with w/u pending per urology, with CT pending.  I'll defer.  He agrees.

## 2019-11-05 NOTE — Assessment & Plan Note (Signed)
Advance directive- wife designated if patient were incapacitated.  

## 2019-11-05 NOTE — Assessment & Plan Note (Signed)
On doxy prn for rosacea flares.  Needed refill on doxy.  No ADE on med.  Cautions d/w pt.

## 2019-11-05 NOTE — Assessment & Plan Note (Signed)
Improved with flexeril, he can try to taper aleve, cautions d/w pt.  See notes on f/u back imaging.

## 2019-11-06 DIAGNOSIS — R3121 Asymptomatic microscopic hematuria: Secondary | ICD-10-CM | POA: Diagnosis not present

## 2019-11-06 DIAGNOSIS — N200 Calculus of kidney: Secondary | ICD-10-CM | POA: Diagnosis not present

## 2019-11-13 ENCOUNTER — Other Ambulatory Visit: Payer: Self-pay

## 2019-11-13 ENCOUNTER — Ambulatory Visit (AMBULATORY_SURGERY_CENTER): Payer: Self-pay | Admitting: *Deleted

## 2019-11-13 VITALS — Temp 97.3°F | Ht 74.0 in | Wt 297.0 lb

## 2019-11-13 DIAGNOSIS — Z1211 Encounter for screening for malignant neoplasm of colon: Secondary | ICD-10-CM

## 2019-11-13 NOTE — Progress Notes (Signed)
2nd covid vaccine greater than 2 weeks ago  Pt is aware that care partner will wait in the car during procedure; if they feel like they will be too hot or cold to wait in the car; they may wait in the 4 th floor lobby. Patient is aware to bring only one care partner. We want them to wear a mask (we do not have any that we can provide them), practice social distancing, and we will check their temperatures when they get here.  I did remind the patient that their care partner needs to stay in the parking lot the entire time and have a cell phone available, we will call them when the pt is ready for discharge. Patient will wear mask into building.   No trouble with anesthesia, difficulty with intubation or hx/fam hx of malignant hyperthermia per pt   No egg or soy allergy  No home oxygen use   No medications for weight loss taken  emmi information given  Pt denies constipation issues

## 2019-11-14 ENCOUNTER — Encounter: Payer: Medicare Other | Admitting: Gastroenterology

## 2019-11-27 ENCOUNTER — Encounter: Payer: Self-pay | Admitting: Gastroenterology

## 2019-11-27 ENCOUNTER — Ambulatory Visit (AMBULATORY_SURGERY_CENTER): Payer: Medicare Other | Admitting: Gastroenterology

## 2019-11-27 ENCOUNTER — Other Ambulatory Visit: Payer: Self-pay

## 2019-11-27 VITALS — BP 135/75 | HR 73 | Temp 97.5°F | Resp 17 | Ht 74.0 in | Wt 297.0 lb

## 2019-11-27 DIAGNOSIS — Z1211 Encounter for screening for malignant neoplasm of colon: Secondary | ICD-10-CM

## 2019-11-27 DIAGNOSIS — I10 Essential (primary) hypertension: Secondary | ICD-10-CM | POA: Diagnosis not present

## 2019-11-27 DIAGNOSIS — D122 Benign neoplasm of ascending colon: Secondary | ICD-10-CM | POA: Diagnosis not present

## 2019-11-27 MED ORDER — SODIUM CHLORIDE 0.9 % IV SOLN: 500.0000 mL | Freq: Once | INTRAVENOUS | Status: DC

## 2019-11-27 NOTE — Progress Notes (Signed)
Report to PACU, RN, vss, BBS= Clear.  

## 2019-11-27 NOTE — Progress Notes (Signed)
Temp by JB Vitals by CW  Pt's states no medical or surgical changes since previsit or office visit.  

## 2019-11-27 NOTE — Op Note (Signed)
[place] Patient Name: Carlos Foster Procedure Date: 11/27/2019 8:52 AM MRN: [id number] Endoscopist: Mallie Mussel L. Loletha Carrow , MD Age: 69 Referring MD:  Date of Birth: 12-16-1950 Gender: Male Account #: [id number] Procedure:                Colonoscopy Indications:              Screening for colorectal malignant neoplasm (last                            colonoscopy Feb 2011) Medicines:                Monitored Anesthesia Care Procedure:                Pre-Anesthesia Assessment:                           - Prior to the procedure, a History and Physical                            was performed, and patient medications and                            allergies were reviewed. The patient's tolerance of                            previous anesthesia was also reviewed. The risks                            and benefits of the procedure and the sedation                            options and risks were discussed with the patient.                            All questions were answered, and informed consent                            was obtained. Prior Anticoagulants: The patient has                            taken no previous anticoagulant or antiplatelet                            agents except for aspirin. ASA Grade Assessment: II                            - A patient with mild systemic disease. After                            reviewing the risks and benefits, the patient was                            deemed in satisfactory condition to undergo the  procedure.                           After obtaining informed consent, the colonoscope                            was passed under direct vision. Throughout the                            procedure, the patient's blood pressure, pulse, and                            oxygen saturations were monitored continuously. The                            Colonoscope was introduced through the anus and   advanced to the the cecum, identified by                            appendiceal orifice and ileocecal valve. The                            colonoscopy was somewhat difficult due to poor                            bowel prep. The patient tolerated the procedure                            well. The quality of the bowel preparation was                            poor. The ileocecal valve, appendiceal orifice, and                            rectum were photographed. The bowel preparation                            used was Miralax. Scope In: 9:23:48 AM Scope Out: 9:41:34 AM Scope Withdrawal Time: 0 hours 12 minutes 37 seconds  Total Procedure Duration: 0 hours 17 minutes 46 seconds  Findings:                 The perianal and digital rectal examinations were                            normal.                           A 4 mm polyp was found in the proximal ascending                            colon. The polyp was sessile. The polyp was removed                            with a cold snare. Resection and retrieval were  complete.                           A large amount of semi-liquid stool was found in                            the entire colon, making visualization difficult.                            Lavage of the area was performed using a large                            amount, resulting in incomplete clearance with fair                            visualization.                           The exam was otherwise without abnormality on                            direct and retroflexion views. Complications:            No immediate complications. Estimated Blood Loss:     Estimated blood loss was minimal. Impression:               - Preparation of the colon was poor.                           - One 4 mm polyp in the proximal ascending colon,                            removed with a cold snare. Resected and retrieved.                           - Stool in the  entire examined colon.                           - The examination was otherwise normal on direct                            and retroflexion views. Recommendation:           - Patient has a contact number available for                            emergencies. The signs and symptoms of potential                            delayed complications were discussed with the                            patient. Return to normal activities tomorrow.                            Written discharge instructions were provided  to the                            patient.                           - Resume previous diet.                           - Continue present medications.                           - Await pathology results.                           - Repeat colonoscopy in 1 year for surveillance.                            (2-DAY PREP) Estill Cotta. Loletha Carrow, MD 11/27/2019 9:48:36 AM This report has been signed electronically.

## 2019-11-27 NOTE — Progress Notes (Signed)
Called to room to assist during endoscopic procedure.  Patient ID and intended procedure confirmed with present staff. Received instructions for my participation in the procedure from the performing physician.  

## 2019-11-27 NOTE — Patient Instructions (Signed)
.  Handout on polyps, diverticulosis given.    YOU HAD AN ENDOSCOPIC PROCEDURE TODAY AT THE [facility]:   Refer to the procedure report that was given to you for any specific questions about what was found during the examination.  If the procedure report does not answer your questions, please call your gastroenterologist to clarify.  If you requested that your care partner not be given the details of your procedure findings, then the procedure report has been included in a sealed envelope for you to review at your convenience later.  YOU SHOULD EXPECT: Some feelings of bloating in the abdomen. Passage of more gas than usual.  Walking can help get rid of the air that was put into your GI tract during the procedure and reduce the bloating. If you had a lower endoscopy (such as a colonoscopy or flexible sigmoidoscopy) you may notice spotting of blood in your stool or on the toilet paper. If you underwent a bowel prep for your procedure, you may not have a normal bowel movement for a few days.  Please Note:  You might notice some irritation and congestion in your nose or some drainage.  This is from the oxygen used during your procedure.  There is no need for concern and it should clear up in a day or so.  SYMPTOMS TO REPORT IMMEDIATELY:   Following lower endoscopy (colonoscopy or flexible sigmoidoscopy):  Excessive amounts of blood in the stool  Significant tenderness or worsening of abdominal pains  Swelling of the abdomen that is new, acute  Fever of 100F or higher   For urgent or emergent issues, a gastroenterologist can be reached at any hour by calling [phone]. Do not use MyChart messaging for urgent concerns.    DIET:  We do recommend a small meal at first, but then you may proceed to your regular diet.  Drink plenty of fluids but you should avoid alcoholic beverages for 24 hours.  ACTIVITY:  You should plan to take it easy for the rest of today and you should NOT  DRIVE or use heavy machinery until tomorrow (because of the sedation medicines used during the test).    FOLLOW UP: Our staff will call the number listed on your records 48-72 hours following your procedure to check on you and address any questions or concerns that you may have regarding the information given to you following your procedure. If we do not reach you, we will leave a message.  We will attempt to reach you two times.  During this call, we will ask if you have developed any symptoms of COVID 19. If you develop any symptoms (ie: fever, flu-like symptoms, shortness of breath, cough etc.) before then, please call [phone].  If you test positive for Covid 19 in the 2 weeks post procedure, please call and report this information to us.    If any biopsies were taken you will be contacted by phone or by letter within the next 1-3 weeks.  Please call us at [phone] if you have not heard about the biopsies in 3 weeks.    SIGNATURES/CONFIDENTIALITY: You and/or your care partner have signed paperwork which will be entered into your electronic medical record.  These signatures attest to the fact that that the information above on your After Visit Summary has been reviewed and is understood.  Full responsibility of the confidentiality of this discharge information lies with you and/or your care-partner. 

## 2019-12-01 ENCOUNTER — Encounter: Payer: Self-pay | Admitting: Gastroenterology

## 2019-12-01 ENCOUNTER — Telehealth: Payer: Self-pay

## 2019-12-01 NOTE — Telephone Encounter (Signed)
1st follow up call made.  NALM 

## 2019-12-01 NOTE — Telephone Encounter (Signed)
Left message on 2nd follow up call. 

## 2019-12-31 DIAGNOSIS — R3121 Asymptomatic microscopic hematuria: Secondary | ICD-10-CM | POA: Diagnosis not present

## 2019-12-31 DIAGNOSIS — N202 Calculus of kidney with calculus of ureter: Secondary | ICD-10-CM | POA: Diagnosis not present

## 2020-05-03 DIAGNOSIS — R972 Elevated prostate specific antigen [PSA]: Secondary | ICD-10-CM | POA: Diagnosis not present

## 2020-05-10 DIAGNOSIS — R972 Elevated prostate specific antigen [PSA]: Secondary | ICD-10-CM | POA: Diagnosis not present

## 2020-05-10 DIAGNOSIS — N200 Calculus of kidney: Secondary | ICD-10-CM | POA: Diagnosis not present

## 2020-05-10 DIAGNOSIS — N401 Enlarged prostate with lower urinary tract symptoms: Secondary | ICD-10-CM | POA: Diagnosis not present

## 2020-05-10 DIAGNOSIS — R351 Nocturia: Secondary | ICD-10-CM | POA: Diagnosis not present

## 2020-05-20 ENCOUNTER — Other Ambulatory Visit: Payer: Self-pay | Admitting: Family Medicine

## 2020-05-20 NOTE — Telephone Encounter (Signed)
Electronic refill request. Cycobenzaprine Last office visit:   11/03/2019 Last Filled:    30 tablet 5 11/03/2019

## 2020-05-21 DIAGNOSIS — M25572 Pain in left ankle and joints of left foot: Secondary | ICD-10-CM | POA: Diagnosis not present

## 2020-05-21 DIAGNOSIS — S86012A Strain of left Achilles tendon, initial encounter: Secondary | ICD-10-CM | POA: Diagnosis not present

## 2020-05-23 NOTE — Telephone Encounter (Signed)
Sent. Thanks.   

## 2020-05-24 DIAGNOSIS — S86012A Strain of left Achilles tendon, initial encounter: Secondary | ICD-10-CM | POA: Diagnosis not present

## 2020-07-03 ENCOUNTER — Ambulatory Visit: Payer: Medicare Other | Attending: Internal Medicine

## 2020-07-03 DIAGNOSIS — Z23 Encounter for immunization: Secondary | ICD-10-CM

## 2020-07-03 NOTE — Progress Notes (Signed)
   Cov[facility] Vaccination [facility]  Name:  Carlos Foster    MRN: [id number] DOB: 1951-07-05  07/03/2020  [name] was observed post Covid-19 immunization for 15 minutes without incident. He was provided with Vaccine Information Sheet and instruction to access the V-Safe system.   [name] was instructed to call 911 with any severe reactions post vaccine: Marland Kitchen Difficulty breathing  . Swelling of face and throat  . A fast heartbeat  . A bad rash all over body  . Dizziness and weakness   Immunizations Administered    No immunizations on file.

## 2020-08-05 DIAGNOSIS — M25672 Stiffness of left ankle, not elsewhere classified: Secondary | ICD-10-CM | POA: Diagnosis not present

## 2020-08-09 DIAGNOSIS — S86012A Strain of left Achilles tendon, initial encounter: Secondary | ICD-10-CM | POA: Diagnosis not present

## 2020-08-17 DIAGNOSIS — S86012A Strain of left Achilles tendon, initial encounter: Secondary | ICD-10-CM | POA: Diagnosis not present

## 2020-08-19 DIAGNOSIS — S86012A Strain of left Achilles tendon, initial encounter: Secondary | ICD-10-CM | POA: Diagnosis not present

## 2020-08-24 DIAGNOSIS — S86012A Strain of left Achilles tendon, initial encounter: Secondary | ICD-10-CM | POA: Diagnosis not present

## 2020-08-26 DIAGNOSIS — S86012A Strain of left Achilles tendon, initial encounter: Secondary | ICD-10-CM | POA: Diagnosis not present

## 2020-08-31 DIAGNOSIS — S86012A Strain of left Achilles tendon, initial encounter: Secondary | ICD-10-CM | POA: Diagnosis not present

## 2020-09-02 DIAGNOSIS — S86012A Strain of left Achilles tendon, initial encounter: Secondary | ICD-10-CM | POA: Diagnosis not present

## 2020-10-31 ENCOUNTER — Other Ambulatory Visit: Payer: Self-pay | Admitting: Family Medicine

## 2020-10-31 DIAGNOSIS — I10 Essential (primary) hypertension: Secondary | ICD-10-CM

## 2020-10-31 DIAGNOSIS — Z125 Encounter for screening for malignant neoplasm of prostate: Secondary | ICD-10-CM

## 2020-11-02 ENCOUNTER — Other Ambulatory Visit: Payer: Self-pay

## 2020-11-02 ENCOUNTER — Other Ambulatory Visit (INDEPENDENT_AMBULATORY_CARE_PROVIDER_SITE_OTHER): Payer: Medicare Other

## 2020-11-02 DIAGNOSIS — I10 Essential (primary) hypertension: Secondary | ICD-10-CM | POA: Diagnosis not present

## 2020-11-02 DIAGNOSIS — Z125 Encounter for screening for malignant neoplasm of prostate: Secondary | ICD-10-CM

## 2020-11-02 LAB — COMPREHENSIVE METABOLIC PANEL
ALT: 24 U/L (ref 0–53)
AST: 19 U/L (ref 0–37)
Albumin: 4.5 g/dL (ref 3.5–5.2)
Alkaline Phosphatase: 74 U/L (ref 39–117)
BUN: 12 mg/dL (ref 6–23)
CO2: 28 mEq/L (ref 19–32)
Calcium: 9.1 mg/dL (ref 8.4–10.5)
Chloride: 103 mEq/L (ref 96–112)
Creatinine, Ser: 1.04 mg/dL (ref 0.40–1.50)
GFR: 74.13 mL/min (ref >60.00)
Glucose, Bld: 111 mg/dL — ABNORMAL HIGH (ref 70–99)
Potassium: 3.8 mEq/L (ref 3.5–5.1)
Sodium: 140 mEq/L (ref 135–145)
Total Bilirubin: 0.9 mg/dL (ref 0.2–1.2)
Total Protein: 7 g/dL (ref 6.0–8.3)

## 2020-11-02 LAB — PSA, MEDICARE: PSA: 4.61 ng/ml — ABNORMAL HIGH (ref 0.10–4.00)

## 2020-11-02 LAB — LIPID PANEL
Cholesterol: 145 mg/dL (ref 0–200)
HDL: 42.3 mg/dL (ref >39.00)
LDL Cholesterol: 76 mg/dL (ref 0–99)
NonHDL: 102.26
Total CHOL/HDL Ratio: 3
Triglycerides: 131 mg/dL (ref 0.0–149.0)
VLDL: 26.2 mg/dL (ref 0.0–40.0)

## 2020-11-02 LAB — TSH: TSH: 2.92 u[IU]/mL (ref 0.35–4.50)

## 2020-11-03 ENCOUNTER — Ambulatory Visit (INDEPENDENT_AMBULATORY_CARE_PROVIDER_SITE_OTHER): Payer: Medicare Other

## 2020-11-03 DIAGNOSIS — Z0000 Encounter for general adult medical examination without abnormal findings: Secondary | ICD-10-CM

## 2020-11-03 NOTE — Progress Notes (Signed)
Subjective:   Carlos Foster is a 70 y.o. male who presents for Medicare Annual/Subsequent preventive examination.  Review of Systems: N/A      I connected with the patient today by telephone and verified that I am speaking with the correct person using two identifiers. Location patient: home Location nurse: work Persons participating in the telephone visit: patient, nurse.   I discussed the limitations, risks, security and privacy concerns of performing an evaluation and management service by telephone and the availability of in person appointments. I also discussed with the patient that there may be a patient responsible charge related to this service. The patient expressed understanding and verbally consented to this telephonic visit.        Cardiac Risk Factors include: advanced age (>6men, >87 women);hypertension;Other (see comment), Risk factor comments: hyperlipidemia     Objective:    Today's Vitals   There is no height or weight on file to calculate BMI.  Advanced Directives 11/03/2020 10/16/2019 03/01/2017 11/24/2016 07/22/2016 07/21/2016 01/06/2016  Does Patient Have a Medical Advance Directive? Yes Yes Yes Yes Yes Yes Yes  Type of Paramedic of [place];Living will [place];Living will [place];Living will [place];Living will Living will Living will -  Does patient want to make changes to medical advance directive? - - - - No - Patient declined - -  Copy of [place] in Chart? No - copy requested No - copy requested No - copy requested No - copy requested - - -    Current Medications (verified) Outpatient Encounter Medications as of 11/03/2020  Medication Sig  . amlodipine-atorvastatin (CADUET) 10-10 MG tablet Take 1 tablet by mouth daily.  Marland Kitchen aspirin EC 81 MG tablet Take 81 mg by mouth daily.  . cyclobenzaprine (FLEXERIL) 10 MG tablet TAKE 1 TABLET BY MOUTH AT BEDTIME AS  NEEDED FOR MUSCLE SPASMS (SEDATION CAUTION).  Marland Kitchen doxycycline (VIBRAMYCIN) 100 MG capsule Take 1 capsule (100 mg total) by mouth 2 (two) times daily as needed. For rosacea flare ups  . levothyroxine (SYNTHROID) 150 MCG tablet Take 1 tablet (150 mcg total) by mouth daily before breakfast.  . metroNIDAZOLE (METROGEL) 1 % gel Apply 1 application  topically daily as needed for rosacea.  Vladimir Faster Glycol-Propyl Glycol 0.4-0.3 % SOLN Place 1 drop into both eyes 3 (three) times daily as needed (for dry eyes/contact irritation).  . potassium chloride SA (KLOR-CON) 20 MEQ tablet TAKE 2 TABLETS BY MOUTH TWICE A DAY  . quinapril (ACCUPRIL) 40 MG tablet Take 1 tablet (40 mg total) by mouth at bedtime.  Marland Kitchen spironolactone (ALDACTONE) 25 MG tablet Take 2 tablets (50 mg total) by mouth daily.   No facility-administered encounter medications on file as of 11/03/2020.    Allergies (verified) Gabapentin and Lyrica [pregabalin]   History: Past Medical History:  Diagnosis Date  . Arthritis    feet  . GERD (gastroesophageal reflux disease)   . History of hiatal hernia   . Hyperlipidemia   . Hypertension   . Hypokalemia   . Hypothyroidism   . Kidney stones   . Rosacea    Past Surgical History:  Procedure Laterality Date  . ACHILLES TENDON SURGERY Left 06/01/2020  . C2 C3 Laminectomy  07/09/2016  . COLONOSCOPY    . COLONOSCOPY  11/2019  . ETT  04/15/03   wnl ST decreased < 57mm V6  . EVACUATION OF CERVICAL HEMATOMA N/A 07/21/2016   Procedure: RE-EXPLORATION OF  ANTERIOR CERVICAL WOUND AND EVACUATION OF CERVICAL HEMATOMA;  Surgeon: Kary Kos, MD;  Location: [facility];  Service: Neurosurgery;  Laterality: N/A;  . HAMMER TOE SURGERY Left 01/06/2016   Procedure: LEFT SECOND HAMMER TOE CORRECTION;  Surgeon: Wylene Simmer, MD;  Location: [facility];  Service: Orthopedics;  Laterality: Left;  . hematoma removal     around larnyx  . [place]  . HERNIA REPAIR Bilateral 2015   inguinal   . LAMINECTOMY  12/02/99   C4/5, 5/6, 6/7 Fusion (Dr. Joya Salm)  . LUMBAR LAMINECTOMY/DECOMPRESSION MICRODISCECTOMY Left 03/02/2017   Procedure: Laminectomy and Foraminotomy - L2-L3 - L3-L4 - left;  Surgeon: Kary Kos, MD;  Location: [facility];  Service: Neurosurgery;  Laterality: Left;  . METATARSAL OSTEOTOMY Left 01/06/2016   Procedure: LEFT SECOND METATARSAL WEIL OSTEOTOMY;  Surgeon: Wylene Simmer, MD;  Location: [facility];  Service: Orthopedics;  Laterality: Left;  Marland Kitchen Morton's Neuroma Repair Right 1982   foot  . PROSTATE BIOPSY  12/10/06   Benign Jeffie Pollock)   Family History  Problem Relation Age of Onset  . Kidney disease Mother        Acute kidney failure  . Heart disease Mother        CAD  . Hypertension Mother   . Diabetes Mother        DM and diabetic retinopathy, toe amputation  . Diabetes Other   . Heart disease Father   . AAA (abdominal aortic aneurysm) Father   . Colon polyps Father   . Lymphoma Maternal Uncle        X 2  . Depression Neg Hx   . Alcohol abuse Neg Hx   . Drug abuse Neg Hx   . Stroke Neg Hx   . Prostate cancer Neg Hx   . Colon cancer Neg Hx   . Esophageal cancer Neg Hx   . Stomach cancer Neg Hx   . Rectal cancer Neg Hx    Social History   Socioeconomic History  . Marital status: Married    Spouse name: Not on file  . Number of children: 1  . Years of education: Not on file  . Highest education level: Not on file  Occupational History  . 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 Smoking status: Never Smoker  . Smokeless tobacco: Never Used  Vaping Use  . Vaping Use: Never used  Substance and Sexual Activity  . Alcohol use: No  . Drug use: No  . Sexual activity: Not on file  Other Topics Concern  . 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 Difficulty of Paying Living Expenses: Not hard at all  Food Insecurity: No Food Insecurity  . Worried About Charity fundraiser in the Last Year: Never true  . Ran Out of Food in the Last Year: Never true  Transportation Needs: No Transportation Needs  . Lack of Transportation (Medical): No  . Lack of Transportation (Non-Medical): No  Physical Activity: Inactive  . Days of Exercise per Week: 0 days  . Minutes of Exercise per Session: 0 min  Stress: No Stress  Concern Present  . Feeling of Stress : Not at all  Social Connections: Not on file    Tobacco Counseling Counseling given: Not Answered   Clinical Intake:  Pre-visit preparation completed: Yes  Pain : 0-10 Pain Type: Chronic pain Pain Location: Back Pain Descriptors / Indicators: Aching Pain Onset: More than a month ago Pain Frequency: Intermittent     Nutritional Risks: None Diabetes: No  How often do you need to have someone help you when you read instructions, pamphlets, or other written materials from your doctor or pharmacy?: 1 - Never  Diabetic: No Nutrition Risk Assessment:  Has the patient had any N/V/D within the last 2 months?  No  Does the patient have any non-healing wounds?  No  Has the patient had any unintentional weight loss or weight gain?  No   Diabetes:  Is the patient diabetic?  No  If diabetic, was a CBG obtained today?  N/A Did the patient bring in their glucometer from home?  N/A How often do you monitor your CBG's? N/A.   Financial Strains and Diabetes Management:  Are you having any financial strains with the device, your supplies or your medication? N/A.  Does the patient want to be seen by Chronic Care Management for management of their diabetes?  N/A Would the patient like to be referred to a Nutritionist or for Diabetic Management?  N/A   Interpreter Needed?:  No  Information entered by :: CJohnson, LPN   Activities of Daily Living In your present state of health, do you have any difficulty performing the following activities: 11/03/2020  Hearing? N  Vision? N  Difficulty concentrating or making decisions? N  Walking or climbing stairs? N  Dressing or bathing? N  Doing errands, shopping? N  Preparing Food and eating ? N  Using the Toilet? N  In the past six months, have you accidently leaked urine? N  Do you have problems with loss of bowel control? N  Managing your Medications? N  Managing your Finances? N  Housekeeping or managing your Housekeeping? N  Some recent data might be hidden    Patient Care Team: Tonia Ghent, MD as PCP - General  Indicate any recent Medical Services you may have received from other than [facility] providers in the past year (date may be approximate).     Assessment:   This is a routine wellness examination for [place].  Hearing/Vision screen  Hearing Screening   125Hz  250Hz  500Hz  1000Hz  2000Hz  3000Hz  4000Hz  6000Hz  8000Hz   Right ear:           Left ear:           Vision Screening Comments: Patient gets annual eye exams  Dietary issues and exercise activities discussed: Current Exercise Habits: The patient does not participate in regular exercise at present, Exercise limited by: None identified  Goals    . Patient Stated     10/16/2019, I will maintain and continue medications as prescribed.     . Patient Stated     11/03/2020, I will maintain and continue medications as prescribed.      Depression Screen PHQ 2/9 Scores 11/03/2020 10/16/2019  PHQ - 2 Score 0 0  PHQ- 9 Score 0 0    Fall Risk Fall Risk  11/03/2020 10/16/2019  Falls in the past year? 0 0  Number falls in past yr: 0 0  Injury with Fall? 0 0  Risk for fall due to : Medication side effect Medication side effect  Follow up Falls evaluation completed;Falls prevention discussed Falls prevention discussed;Falls evaluation completed    FALL  RISK PREVENTION PERTAINING TO THE HOME:  Any stairs in or around the home? Yes  If so, are there any without handrails? No  Home free of loose throw rugs in walkways, pet beds, electrical cords, etc? Yes  Adequate lighting in your home to reduce risk of falls? Yes   ASSISTIVE DEVICES UTILIZED TO PREVENT FALLS:  Life alert? No  Use of a cane, walker or w/c? No  Grab bars in the bathroom? No  Shower chair or bench in shower? No  Elevated toilet seat or a handicapped toilet? No   TIMED UP AND GO:  Was the test performed? N/A telephone visit .    Cognitive Function: MMSE - Mini Mental State Exam 11/03/2020 10/16/2019  Not completed: Refused -  Orientation to time - 5  Orientation to Place - 5  Registration - 3  Attention/ Calculation - 5  Recall - 3  Language- repeat - 1  Mini Cog  Mini-Cog screen was not completed. Patient wanted to skip. Maximum score is 22. A value of 0 denotes this part of the MMSE was not completed or the patient failed this part of the Mini-Cog screening.       Immunizations Immunization History  Administered Date(s) Administered  . Fluad Quad(high Dose 65+) 05/01/2019  . HPV Bivalent 05/04/2016  . Influenza Split 07/06/2011, 06/07/2012  . Influenza Whole 04/30/2006, 05/30/2007, 04/23/2009, 03/31/2010  . Influenza-Unspecified 05/14/2013, 03/31/2014, 04/01/2015, 05/18/2016, 05/03/2017  . Moderna SARS-COV2 Booster Vaccination 07/03/2020  . Moderna Sars-Covid-2 Vaccination 08/07/2019, 09/04/2019  . Pneumococcal Polysaccharide-23 06/21/2010  . Td 04/01/2003  . Tdap 09/10/2013    TDAP status: Up to date  Flu Vaccine status: due 02/2021  Pneumococcal vaccine status: Due, Education has been provided regarding the importance of this vaccine. Advised may receive this vaccine at local pharmacy or Health Dept. Aware to provide a copy of the vaccination record if obtained from local pharmacy or Health Dept. Verbalized acceptance and understanding.  Covid-19  vaccine status: Completed vaccines  Qualifies for Shingles Vaccine? Yes   Zostavax completed No   Shingrix Completed?: No.    Education has been provided regarding the importance of this vaccine. Patient has been advised to call insurance company to determine out of pocket expense if they have not yet received this vaccine. Advised may also receive vaccine at local pharmacy or Health Dept. Verbalized acceptance and understanding.  Screening Tests Health Maintenance  Topic Date Due  . PNA vac Low Risk Adult (1 of 2 - PCV13) 12/30/2017  . COLONOSCOPY (Pts 45-65yrs Insurance coverage will need to be confirmed)  11/26/2020  . INFLUENZA VACCINE  02/28/2021  . TETANUS/TDAP  09/11/2023  . COVID-19 Vaccine  Completed  . Hepatitis C Screening  Completed  . HPV VACCINES  Aged Out    Health Maintenance  Health Maintenance Due  Topic Date Due  . PNA vac Low Risk Adult (1 of 2 - PCV13) 12/30/2017    Colorectal cancer screening: Type of screening: Colonoscopy. Completed 11/27/2019. Repeat every 1 years  Lung Cancer Screening: (Low Dose CT Chest recommended if Age 59-80 years, 30 pack-year currently smoking OR have quit w/in 15years.) does not qualify.    Additional Screening:  Hepatitis C Screening: does qualify; Completed 11/19/2015  Vision Screening: Recommended annual ophthalmology exams for early detection of glaucoma and other disorders of the eye. Is the patient up to date with their annual eye exam?  Yes  Who is the provider or what is the name of the office in which the patient attends annual eye exams? [facility] If pt is not established with a provider, would they like to be referred to a provider to establish care? No .   Dental Screening: Recommended annual dental exams for proper oral hygiene  Community Resource Referral / Chronic Care Management: CRR required this visit?  No   CCM required this visit?  No      Plan:     I have personally reviewed and noted the  following in the patient's chart:   . Medical and social history . Use of alcohol, tobacco or illicit drugs  . Current medications and supplements . Functional ability and status . Nutritional status . Physical activity . Advanced directives . List of other physicians . Hospitalizations, surgeries, and ER visits in previous 12 months . Vitals . Screenings to include cognitive, depression, and falls . Referrals and appointments  In addition, I have reviewed and discussed with patient certain preventive protocols, quality metrics, and best practice recommendations. A written personalized care plan for preventive services as well as general preventive health recommendations were provided to patient.   Due to this being a telephonic visit, the after visit summary with patients personalized plan was offered to patient via office or my-chart. Patient preferred to pick up at office at next visit or via mychart.   Andrez Grime, LPN   09/29/4386

## 2020-11-03 NOTE — Progress Notes (Signed)
PCP notes:  Health Maintenance: Prevnar 13- due   Abnormal Screenings: none   Patient concerns: Discuss chronic back pain    Nurse concerns: none   Next PCP appt: 11/09/2020 @ 2:30 pm

## 2020-11-03 NOTE — Patient Instructions (Signed)
Carlos Foster , Thank you for taking time to come for your Medicare Wellness Visit. I appreciate your ongoing commitment to your health goals. Please review the following plan we discussed and let me know if I can assist you in the future.   Screening recommendations/referrals: Colonoscopy: Up to date, completed 11/27/2019, due 11/26/2020 Recommended yearly ophthalmology/optometry visit for glaucoma screening and checkup Recommended yearly dental visit for hygiene and checkup  Vaccinations: Influenza vaccine: due 02/2021 Pneumococcal vaccine: due, will discuss with provider  Tdap vaccine: Up to date, completed 09/10/2013, due 09/2023 Shingles vaccine: due, check with your insurance regarding coverage if interested    Covid-19: Completed series  Advanced directives: Please bring a copy of your POA (Power of [place]) and/or Living Will to your next appointment.   Conditions/risks identified: hypertension, hyperlipidemia   Next appointment: Follow up in one year for your annual wellness visit.   Preventive Care 68 Years and Older, Male Preventive care refers to lifestyle choices and visits with your health care provider that can promote health and wellness. What does preventive care include?  A yearly physical exam. This is also called an annual well check.  Dental exams once or twice a year.  Routine eye exams. Ask your health care provider how often you should have your eyes checked.  Personal lifestyle choices, including:  Daily care of your teeth and gums.  Regular physical activity.  Eating a healthy diet.  Avoiding tobacco and drug use.  Limiting alcohol use.  Practicing safe sex.  Taking low doses of aspirin every day.  Taking vitamin and mineral supplements as recommended by your health care provider. What happens during an annual well check? The services and screenings done by your health care provider during your annual well check will depend on your age, overall  health, lifestyle risk factors, and family history of disease. Counseling  Your health care provider may ask you questions about your:  Alcohol use.  Tobacco use.  Drug use.  Emotional well-being.  Home and relationship well-being.  Sexual activity.  Eating habits.  History of falls.  Memory and ability to understand (cognition).  Work and work Statistician. Screening  You may have the following tests or measurements:  Height, weight, and BMI.  Blood pressure.  Lipid and cholesterol levels. These may be checked every 5 years, or more frequently if you are over 53 years old.  Skin check.  Lung cancer screening. You may have this screening every year starting at age 82 if you have a 30-pack-year history of smoking and currently smoke or have quit within the past 15 years.  Fecal occult blood test (FOBT) of the stool. You may have this test every year starting at age 37.  Flexible sigmoidoscopy or colonoscopy. You may have a sigmoidoscopy every 5 years or a colonoscopy every 10 years starting at age 17.  Prostate cancer screening. Recommendations will vary depending on your family history and other risks.  Hepatitis C blood test.  Hepatitis B blood test.  Sexually transmitted disease (STD) testing.  Diabetes screening. This is done by checking your blood sugar (glucose) after you have not eaten for a while (fasting). You may have this done every 1-3 years.  Abdominal aortic aneurysm (AAA) screening. You may need this if you are a current or former smoker.  Osteoporosis. You may be screened starting at age 95 if you are at high risk. Talk with your health care provider about your test results, treatment options, and if necessary, the need  for more tests. Vaccines  Your health care provider may recommend certain vaccines, such as:  Influenza vaccine. This is recommended every year.  Tetanus, diphtheria, and acellular pertussis (Tdap, Td) vaccine. You may need a Td  booster every 10 years.  Zoster vaccine. You may need this after age 30.  Pneumococcal 13-valent conjugate (PCV13) vaccine. One dose is recommended after age 79.  Pneumococcal polysaccharide (PPSV23) vaccine. One dose is recommended after age 65. Talk to your health care provider about which screenings and vaccines you need and how often you need them. This information is not intended to replace advice given to you by your health care provider. Make sure you discuss any questions you have with your health care provider. Document Released: 08/13/2015 Document Revised: 04/05/2016 Document Reviewed: 05/18/2015 Elsevier Interactive Patient Education  2017 [place] Prevention in the Home Falls can cause injuries. They can happen to people of all ages. There are many things you can do to make your home safe and to help prevent falls. What can I do on the outside of my home?  Regularly fix the edges of walkways and driveways and fix any cracks.  Remove anything that might make you trip as you walk through a door, such as a raised step or threshold.  Trim any bushes or trees on the path to your home.  Use bright outdoor lighting.  Clear any walking paths of anything that might make someone trip, such as rocks or tools.  Regularly check to see if handrails are loose or broken. Make sure that both sides of any steps have handrails.  Any raised decks and porches should have guardrails on the edges.  Have any leaves, snow, or ice cleared regularly.  Use sand or salt on walking paths during winter.  Clean up any spills in your garage right away. This includes oil or grease spills. What can I do in the bathroom?  Use night lights.  Install grab bars by the toilet and in the tub and shower. Do not use towel bars as grab bars.  Use non-skid mats or decals in the tub or shower.  If you need to sit down in the shower, use a plastic, non-slip stool.  Keep the floor dry. Clean up  any water that spills on the floor as soon as it happens.  Remove soap buildup in the tub or shower regularly.  Attach bath mats securely with double-sided non-slip rug tape.  Do not have throw rugs and other things on the floor that can make you trip. What can I do in the bedroom?  Use night lights.  Make sure that you have a light by your bed that is easy to reach.  Do not use any sheets or blankets that are too big for your bed. They should not hang down onto the floor.  Have a firm chair that has side arms. You can use this for support while you get dressed.  Do not have throw rugs and other things on the floor that can make you trip. What can I do in the kitchen?  Clean up any spills right away.  Avoid walking on wet floors.  Keep items that you use a lot in easy-to-reach places.  If you need to reach something above you, use a strong step stool that has a grab bar.  Keep electrical cords out of the way.  Do not use floor polish or wax that makes floors slippery. If you must use wax, use  non-skid floor wax.  Do not have throw rugs and other things on the floor that can make you trip. What can I do with my stairs?  Do not leave any items on the stairs.  Make sure that there are handrails on both sides of the stairs and use them. Fix handrails that are broken or loose. Make sure that handrails are as long as the stairways.  Check any carpeting to make sure that it is firmly attached to the stairs. Fix any carpet that is loose or worn.  Avoid having throw rugs at the top or bottom of the stairs. If you do have throw rugs, attach them to the floor with carpet tape.  Make sure that you have a light switch at the top of the stairs and the bottom of the stairs. If you do not have them, ask someone to add them for you. What else can I do to help prevent falls?  Wear shoes that:  Do not have high heels.  Have rubber bottoms.  Are comfortable and fit you well.  Are  closed at the toe. Do not wear sandals.  If you use a stepladder:  Make sure that it is fully opened. Do not climb a closed stepladder.  Make sure that both sides of the stepladder are locked into place.  Ask someone to hold it for you, if possible.  Clearly mark and make sure that you can see:  Any grab bars or handrails.  First and last steps.  Where the edge of each step is.  Use tools that help you move around (mobility aids) if they are needed. These include:  Canes.  Walkers.  Scooters.  Crutches.  Turn on the lights when you go into a dark area. Replace any light bulbs as soon as they burn out.  Set up your furniture so you have a clear path. Avoid moving your furniture around.  If any of your floors are uneven, fix them.  If there are any pets around you, be aware of where they are.  Review your medicines with your doctor. Some medicines can make you feel dizzy. This can increase your chance of falling. Ask your doctor what other things that you can do to help prevent falls. This information is not intended to replace advice given to you by your health care provider. Make sure you discuss any questions you have with your health care provider. Document Released: 05/13/2009 Document Revised: 12/23/2015 Document Reviewed: 08/21/2014 Elsevier Interactive Patient Education  2017 [employer].

## 2020-11-09 ENCOUNTER — Other Ambulatory Visit: Payer: Self-pay

## 2020-11-09 ENCOUNTER — Encounter: Payer: Self-pay | Admitting: Family Medicine

## 2020-11-09 ENCOUNTER — Ambulatory Visit (INDEPENDENT_AMBULATORY_CARE_PROVIDER_SITE_OTHER): Payer: Medicare Other | Admitting: Family Medicine

## 2020-11-09 VITALS — BP 122/80 | HR 96 | Temp 97.6°F | Ht 73.0 in | Wt 294.0 lb

## 2020-11-09 DIAGNOSIS — E038 Other specified hypothyroidism: Secondary | ICD-10-CM | POA: Diagnosis not present

## 2020-11-09 DIAGNOSIS — L570 Actinic keratosis: Secondary | ICD-10-CM | POA: Diagnosis not present

## 2020-11-09 DIAGNOSIS — M7060 Trochanteric bursitis, unspecified hip: Secondary | ICD-10-CM | POA: Diagnosis not present

## 2020-11-09 DIAGNOSIS — E785 Hyperlipidemia, unspecified: Secondary | ICD-10-CM

## 2020-11-09 DIAGNOSIS — I10 Essential (primary) hypertension: Secondary | ICD-10-CM | POA: Diagnosis not present

## 2020-11-09 DIAGNOSIS — I77811 Abdominal aortic ectasia: Secondary | ICD-10-CM | POA: Diagnosis not present

## 2020-11-09 DIAGNOSIS — Z0000 Encounter for general adult medical examination without abnormal findings: Secondary | ICD-10-CM

## 2020-11-09 DIAGNOSIS — Z7189 Other specified counseling: Secondary | ICD-10-CM

## 2020-11-09 MED ORDER — METRONIDAZOLE 1 % EX GEL: CUTANEOUS | 3 refills | Status: DC

## 2020-11-09 MED ORDER — CYCLOBENZAPRINE HCL 10 MG PO TABS: ORAL_TABLET | ORAL | 3 refills | Status: DC

## 2020-11-09 MED ORDER — DOXYCYCLINE HYCLATE 100 MG PO CAPS: 100.0000 mg | ORAL_CAPSULE | Freq: Two times a day (BID) | ORAL | 3 refills | Status: DC | PRN

## 2020-11-09 MED ORDER — POTASSIUM CHLORIDE CRYS ER 20 MEQ PO TBCR: EXTENDED_RELEASE_TABLET | ORAL | 3 refills | Status: DC

## 2020-11-09 MED ORDER — QUINAPRIL HCL 40 MG PO TABS: 40.0000 mg | ORAL_TABLET | Freq: Every day | ORAL | 3 refills | Status: DC

## 2020-11-09 MED ORDER — LEVOTHYROXINE SODIUM 150 MCG PO TABS: 150.0000 ug | ORAL_TABLET | Freq: Every day | ORAL | 3 refills | Status: DC

## 2020-11-09 MED ORDER — PREDNISONE 10 MG PO TABS: ORAL_TABLET | ORAL | 0 refills | Status: DC

## 2020-11-09 MED ORDER — AMLODIPINE-ATORVASTATIN 10-10 MG PO TABS: 1.0000 | ORAL_TABLET | Freq: Every day | ORAL | 3 refills | Status: DC

## 2020-11-09 MED ORDER — SPIRONOLACTONE 25 MG PO TABS: 50.0000 mg | ORAL_TABLET | Freq: Every day | ORAL | 3 refills | Status: DC

## 2020-11-09 NOTE — Progress Notes (Signed)
This visit occurred during the SARS-CoV-2 public health emergency.  Safety protocols were in place, including screening questions prior to the visit, additional usage of staff PPE, and extensive cleaning of exam room while observing appropriate contact time as indicated for disinfecting solutions.  He is retiring in June of this year.  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  Discussed.  Hip and back pain.  He has chronic back pain.  Flexeril helps with leg pain but still with laying down at night.  Pain near B greater trochanter.  More pain laying on side at night.    Hypertension:    Using medication without problems or lightheadedness: yes Chest pain with exertion:no Edema:no Short of breath:no  Elevated Cholesterol: Using medications without problems: yes Muscle aches: not likely from statin.   Diet compliance: encouraged. Exercise: encouraged.    Hypothyroidism.  TSH wnl.  No neck mass.  Compliant, d/w pt about med use.  Labs d/w pt.  No dysphagia.    PSA similar to prev.  He had urology f/u.  We will route a copy of his labs to urology  Flu 2021 Shingles dw pt.   PNA deferred for now.  He didn't feel well after prev PNA vaccine dose w/o allergic rxn and it makes sense to defer for now.  Tetanus 2015 covid vaccine 2021 Colonoscopy 2021 Advance directive- wife designated if patient were incapacitated. AAA screening due summer 2022, discussed with patient  See after visit summary.  I talked with patient about him calling for GI follow-up.  Information given to patient.  Meds, vitals, and allergies reviewed.   PMH and SH reviewed  ROS: Per HPI unless specifically indicated in ROS section   GEN: nad, alert and oriented HEENT: ncat NECK: supple w/o LA CV: rrr. PULM: ctab, no inc wob ABD: soft, +bs EXT: no edema SKIN: no acute rash but he does have 2 actinic lesions that are approximately 5 mm across on the scalp, 1 on the left side of the scalp and the other on the  posterior section of the scalp.  No ulceration.  Discussed options and he gave consent for treatment with liquid nitrogen.  Each lesion frozen and thawed x3 per routine without complication and tolerated well.  Post cryotherapy instructions given to patient. Back not tender to palpation of midline and he has normal hip range of motion without pain but he does have tenderness near the bilateral greater trochanteric area.

## 2020-11-09 NOTE — Patient Instructions (Addendum)
Call GI about follow up.   Likely trochanteric bursitis.  Ice for 5 minutes at a time and take prednisone with food.  Ask about seeing ortho if not better.   The AK should blister and then heal over.  Take care.  Glad to see you.

## 2020-11-10 DIAGNOSIS — L570 Actinic keratosis: Secondary | ICD-10-CM | POA: Insufficient documentation

## 2020-11-10 DIAGNOSIS — M7060 Trochanteric bursitis, unspecified hip: Secondary | ICD-10-CM | POA: Insufficient documentation

## 2020-11-10 NOTE — Assessment & Plan Note (Signed)
PSA similar to prev.  He had urology f/u.  We will route a copy of his labs to urology  Flu 2021 Shingles dw pt.   PNA deferred for now.  He didn't feel well after prev PNA vaccine dose w/o allergic rxn and it makes sense to defer for now.  Tetanus 2015 covid vaccine 2021 Colonoscopy 2021 Advance directive- wife designated if patient were incapacitated. AAA screening due summer 2022, discussed with patient

## 2020-11-10 NOTE — Assessment & Plan Note (Signed)
We can recheck aortic ultrasound this summer.  Discussed.  Not due now.  No symptoms.

## 2020-11-10 NOTE — Assessment & Plan Note (Signed)
Continue atorvastatin as part of amlodipine/atorvastatin.  Labs discussed with patient.  Continue work on diet and exercise.

## 2020-11-10 NOTE — Assessment & Plan Note (Signed)
TSH wnl.  No neck mass.  Compliant, d/w pt about med use.  Labs d/w pt.  No dysphagia.   Continue levothyroxine as is.

## 2020-11-10 NOTE — Assessment & Plan Note (Signed)
Both treated as above x3 with liquid nitrogen after getting consent from the patient.  Routine instructions given to patient, tolerated well.  He will update me as needed.

## 2020-11-10 NOTE — Assessment & Plan Note (Signed)
Continue spironolactone and potassium along with amlodipine/atorvastatin and quinapril.  Labs discussed with patient.  Creatinine stable.  Potassium normal.

## 2020-11-10 NOTE — Assessment & Plan Note (Signed)
Likely trochanteric bursitis.  Ice for 5 minutes at a time and take prednisone with food.  Ask about seeing ortho if not better.  He agrees with plan.

## 2020-11-10 NOTE — Assessment & Plan Note (Signed)
Advance directive- wife designated if patient were incapacitated.  

## 2020-11-29 ENCOUNTER — Telehealth: Payer: Self-pay | Admitting: *Deleted

## 2020-11-29 ENCOUNTER — Encounter: Payer: Self-pay | Admitting: Gastroenterology

## 2020-11-29 ENCOUNTER — Encounter: Payer: Self-pay | Admitting: Family Medicine

## 2020-11-29 DIAGNOSIS — R739 Hyperglycemia, unspecified: Secondary | ICD-10-CM

## 2020-11-29 NOTE — Telephone Encounter (Signed)
Patient called and left a voicemail stating that he had an employment physical. Patient stated that they told him that he needed to do a follow-up A1C. Patient wants to know if Dr. Damita Dunnings can order this lab work for him.

## 2020-11-30 ENCOUNTER — Other Ambulatory Visit (INDEPENDENT_AMBULATORY_CARE_PROVIDER_SITE_OTHER): Payer: Medicare Other

## 2020-11-30 ENCOUNTER — Other Ambulatory Visit: Payer: Self-pay

## 2020-11-30 ENCOUNTER — Other Ambulatory Visit: Payer: Medicare Other

## 2020-11-30 DIAGNOSIS — R739 Hyperglycemia, unspecified: Secondary | ICD-10-CM

## 2020-11-30 LAB — POCT GLYCOSYLATED HEMOGLOBIN (HGB A1C): Hemoglobin A1C: 5.6 % (ref 4.0–5.6)

## 2020-11-30 NOTE — Telephone Encounter (Signed)
Ordered.  Please see if he can drop off a copy of his labs.  Please schedule lab visit.  Doesn't need to fast.

## 2020-11-30 NOTE — Telephone Encounter (Signed)
Called patient appointment made. Will bring lab results in when he comes by today for lab

## 2021-01-04 ENCOUNTER — Encounter: Payer: Self-pay | Admitting: Family Medicine

## 2021-01-05 ENCOUNTER — Encounter: Payer: Self-pay | Admitting: Family Medicine

## 2021-01-05 ENCOUNTER — Telehealth (INDEPENDENT_AMBULATORY_CARE_PROVIDER_SITE_OTHER): Payer: Medicare Other | Admitting: Family Medicine

## 2021-01-05 ENCOUNTER — Telehealth: Payer: Self-pay | Admitting: Family Medicine

## 2021-01-05 DIAGNOSIS — U071 COVID-19: Secondary | ICD-10-CM

## 2021-01-05 MED ORDER — MOLNUPIRAVIR EUA 200MG CAPSULE: 4.0000 | ORAL_CAPSULE | Freq: Two times a day (BID) | ORAL | 0 refills | Status: DC

## 2021-01-05 NOTE — Progress Notes (Signed)
Virtual Visit via Video Note  I connected with Carlos Foster on 01/05/21 at  4:00 PM EDT by a video enabled telemedicine application 2/2 [id number]-[id number] pandemic and verified that I am speaking with the correct person using two identifiers.  Location patient: home Location provider:work or home office Persons participating in the virtual visit: patient, provider  I discussed the limitations of evaluation and management by telemedicine and the availability of in person appointments. The patient expressed understanding and agreed to proceed.   HPI: Pt with sinus pressure, nasal drainage, and HA starting yesterday 01/04/21, with a positive at home COVID test the same day.  Denies fever, chills, sore throat, cough, SOB, n/v, loss of taste/smell, diarrhea, ear pain/pressure.  Pt's wife tested positive for COVID on Monday.  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  Tried tylenol and nasal spray for symptoms.  ROS: See pertinent positives and negatives per HPI.  Past Medical History:  Diagnosis Date  . Arthritis    feet  . GERD (gastroesophageal reflux disease)   . History of hiatal hernia   . Hyperlipidemia   . Hypertension   . Hypokalemia   . Hypothyroidism   . Kidney stones   . Rosacea     Past Surgical History:  Procedure Laterality Date  . ACHILLES TENDON SURGERY Left 06/01/2020  . C2 C3 Laminectomy  07/09/2016  . COLONOSCOPY    . COLONOSCOPY  11/2019  . ETT  04/15/03   wnl ST decreased < 58mm V6  . EVACUATION OF CERVICAL HEMATOMA N/A 07/21/2016   Procedure: RE-EXPLORATION OF ANTERIOR CERVICAL WOUND AND EVACUATION OF CERVICAL HEMATOMA;  Surgeon: Kary Kos, MD;  Location: [place];  Service: Neurosurgery;  Laterality: N/A;  . HAMMER TOE SURGERY Left 01/06/2016   Procedure: LEFT SECOND HAMMER TOE CORRECTION;  Surgeon: Wylene Simmer, MD;  Location: [place];  Service: Orthopedics;  Laterality: Left;  . hematoma removal     around larnyx  . [place]  . HERNIA REPAIR Bilateral 2015   inguinal  . LAMINECTOMY  12/02/99   C4/5, 5/6, 6/7 Fusion (Dr. Joya Salm)  . LUMBAR LAMINECTOMY/DECOMPRESSION MICRODISCECTOMY Left 03/02/2017   Procedure: Laminectomy and Foraminotomy - L2-L3 - L3-L4 - left;  Surgeon: Kary Kos, MD;  Location: [place];  Service: Neurosurgery;  Laterality: Left;  . METATARSAL OSTEOTOMY Left 01/06/2016   Procedure: LEFT SECOND METATARSAL WEIL OSTEOTOMY;  Surgeon: Wylene Simmer, MD;  Location: [place];  Service: Orthopedics;  Laterality: Left;  Marland Kitchen Morton's Neuroma Repair Right 1982   foot  . PROSTATE BIOPSY  12/10/06   Benign Jeffie Pollock)    Family History  Problem Relation Age of Onset  . Kidney disease Mother        Acute kidney failure  . Heart disease Mother        CAD  . Hypertension Mother   . Diabetes Mother        DM and diabetic retinopathy, toe amputation  . Diabetes Other   . Heart disease Father   . AAA (abdominal aortic aneurysm) Father   . Colon polyps Father   . Lymphoma Maternal Uncle        X 2  . Depression Neg Hx   . Alcohol abuse Neg Hx   . Drug abuse Neg Hx   . Stroke Neg Hx   . Prostate cancer Neg Hx   . Colon cancer Neg Hx   . Esophageal cancer  Neg Hx   . Stomach cancer Neg Hx   . Rectal cancer Neg Hx      Current Outpatient Medications:  .  amlodipine-atorvastatin (CADUET) 10-10 MG tablet, Take 1 tablet by mouth daily., Disp: 90 tablet, Rfl: 3 .  aspirin EC 81 MG tablet, Take 81 mg by mouth daily., Disp: , Rfl:  .  cyclobenzaprine (FLEXERIL) 10 MG tablet, TAKE 1 TABLET BY MOUTH AT BEDTIME AS NEEDED FOR MUSCLE SPASMS (SEDATION CAUTION)., Disp: 90 tablet, Rfl: 3 .  doxycycline (VIBRAMYCIN) 100 MG capsule, Take 1 capsule (100 mg total) by mouth 2 (two) times daily as needed. For rosacea flare ups, Disp: 60 capsule, Rfl: 3 .  levothyroxine (SYNTHROID) 150 MCG tablet, Take 1 tablet (150 mcg total) by mouth daily before breakfast., Disp: 90 tablet, Rfl: 3 .  metroNIDAZOLE  (METROGEL) 1 % gel, Apply 1 application  topically daily as needed for rosacea., Disp: 60 g, Rfl: 3 .  Polyethyl Glycol-Propyl Glycol 0.4-0.3 % SOLN, Place 1 drop into both eyes 3 (three) times daily as needed (for dry eyes/contact irritation)., Disp: , Rfl:  .  potassium chloride SA (KLOR-CON) 20 MEQ tablet, TAKE 2 TABLETS BY MOUTH TWICE A DAY, Disp: 360 tablet, Rfl: 3 .  predniSONE (DELTASONE) 10 MG tablet, Take 2 a day for 5 days, then 1 a day for 5 days, with food. Don't take with aleve/ibuprofen., Disp: 15 tablet, Rfl: 0 .  quinapril (ACCUPRIL) 40 MG tablet, Take 1 tablet (40 mg total) by mouth at bedtime., Disp: 90 tablet, Rfl: 3 .  spironolactone (ALDACTONE) 25 MG tablet, Take 2 tablets (50 mg total) by mouth daily., Disp: 180 tablet, Rfl: 3  EXAM:  VITALS per patient if applicable:   RR between 12-20 bpm  GENERAL: alert, oriented, appears well and in no acute distress  HEENT: atraumatic, conjunctiva clear, no obvious abnormalities on inspection of external nose and ears  NECK: normal movements of the head and neck  LUNGS: on inspection no signs of respiratory distress, breathing rate appears normal, no obvious gross SOB, gasping or wheezing  CV: no obvious cyanosis  MS: moves all visible extremities without noticeable abnormality  PSYCH/NEURO: pleasant and cooperative, no obvious depression or anxiety, speech and thought processing grossly intact  ASSESSMENT AND PLAN:  Discussed the following assessment and plan:  COVID-19 virus infection  -Positive COVID test 01/04/21 -continue supportive care including rest, hydration, OTC cough/cold meds, nasal spray, Tylenol -discussed r/b/a of antiviral meds.  Rx written, pt to decide in the next 1-2 days if he wishes to pick up the rx -given strict precautions - Plan: molnupiravir EUA 200 mg CAPS  F/u prn   I discussed the assessment and treatment plan with the patient. The patient was provided an opportunity to ask questions and all  were answered. The patient agreed with the plan and demonstrated an understanding of the instructions.   The patient was advised to call back or seek an in-person evaluation if the symptoms worsen or if the condition fails to improve as anticipated.   Billie Ruddy, MD

## 2021-01-05 NOTE — Telephone Encounter (Signed)
Carlos Foster is calling and stated that they received a prescription from Dr. Volanda Napoleon and they don't have the medication at that location and pt requested the prescription [name], please advise. CB is [phone]

## 2021-01-06 ENCOUNTER — Other Ambulatory Visit: Payer: Self-pay

## 2021-01-06 MED ORDER — MOLNUPIRAVIR EUA 200MG CAPSULE: 4.0000 | ORAL_CAPSULE | Freq: Two times a day (BID) | ORAL | 0 refills | Status: AC

## 2021-01-06 NOTE — Telephone Encounter (Signed)
[facility] to [facility] in [facility].

## 2021-01-20 ENCOUNTER — Other Ambulatory Visit: Payer: Self-pay

## 2021-01-20 ENCOUNTER — Ambulatory Visit (AMBULATORY_SURGERY_CENTER): Payer: Medicare Other | Admitting: *Deleted

## 2021-01-20 VITALS — Ht 73.0 in | Wt 294.0 lb

## 2021-01-20 DIAGNOSIS — Z86010 Personal history of colonic polyps: Secondary | ICD-10-CM

## 2021-01-20 MED ORDER — PEG-KCL-NACL-NASULF-NA ASC-C 100 G PO SOLR: 1.0000 | Freq: Once | ORAL | 0 refills | Status: AC

## 2021-01-20 NOTE — Progress Notes (Signed)
Patient's pre-visit was done today over the phone with the patient due to COVID-19 pandemic. Name,DOB and address verified. Insurance verified. Patient denies any allergies to Eggs and Soy. Patient denies any problems with anesthesia/sedation. Patient denies taking diet pills or blood thinners. Packet of Prep instructions mailed to patient including a copy of a consent form-pt is aware. Patient understands to call us back with any questions or concerns. Patient is aware of our care-partner policy and [facility]-85 safety protocol.   EMMI education assigned to the patient for the procedure, sent to [facility].   The patient is COVID-19 vaccinated, per patient.

## 2021-02-03 ENCOUNTER — Encounter: Payer: Self-pay | Admitting: Gastroenterology

## 2021-02-03 ENCOUNTER — Ambulatory Visit (AMBULATORY_SURGERY_CENTER): Payer: Medicare Other | Admitting: Gastroenterology

## 2021-02-03 ENCOUNTER — Other Ambulatory Visit: Payer: Self-pay

## 2021-02-03 ENCOUNTER — Encounter: Payer: Self-pay | Admitting: Family Medicine

## 2021-02-03 VITALS — BP 134/81 | HR 74 | Temp 97.8°F | Resp 15 | Ht 73.0 in | Wt 294.0 lb

## 2021-02-03 DIAGNOSIS — D122 Benign neoplasm of ascending colon: Secondary | ICD-10-CM | POA: Diagnosis not present

## 2021-02-03 DIAGNOSIS — Z86010 Personal history of colonic polyps: Secondary | ICD-10-CM | POA: Diagnosis not present

## 2021-02-03 MED ORDER — SODIUM CHLORIDE 0.9 % IV SOLN: 500.0000 mL | Freq: Once | INTRAVENOUS | Status: DC

## 2021-02-03 NOTE — Progress Notes (Signed)
Pt's states no medical or surgical changes since previsit or office visit. 

## 2021-02-03 NOTE — Progress Notes (Signed)
PT taken to PACU. Monitors in place. VSS. Report given to RN. 

## 2021-02-03 NOTE — Progress Notes (Signed)
Called to room to assist during endoscopic procedure.  Patient ID and intended procedure confirmed with present staff. Received instructions for my participation in the procedure from the performing physician.  

## 2021-02-03 NOTE — Patient Instructions (Signed)
HANDOUTS PROVIDED ON: POLYPS & DIVERTICULOSIS  The polyp removed today have been sent for pathology.  The results can take 1-3 weeks to receive.  When your next colonoscopy should occur will be based on the pathology results.    You may resume your previous diet and medication schedule.  Thank you for allowing [place] to care for you today!!!   YOU HAD AN ENDOSCOPIC PROCEDURE TODAY AT [facility]:   Refer to the procedure report that was given to you for any specific questions about what was found during the examination.  If the procedure report does not answer your questions, please call your gastroenterologist to clarify.  If you requested that your care partner not be given the details of your procedure findings, then the procedure report has been included in a sealed envelope for you to review at your convenience later.  YOU SHOULD EXPECT: Some feelings of bloating in the abdomen. Passage of more gas than usual.  Walking can help get rid of the air that was put into your GI tract during the procedure and reduce the bloating. If you had a lower endoscopy (such as a colonoscopy or flexible sigmoidoscopy) you may notice spotting of blood in your stool or on the toilet paper. If you underwent a bowel prep for your procedure, you may not have a normal bowel movement for a few days.  Please Note:  You might notice some irritation and congestion in your nose or some drainage.  This is from the oxygen used during your procedure.  There is no need for concern and it should clear up in a day or so.  SYMPTOMS TO REPORT IMMEDIATELY:  Following lower endoscopy (colonoscopy or flexible sigmoidoscopy):  Excessive amounts of blood in the stool  Significant tenderness or worsening of abdominal pains  Swelling of the abdomen that is new, acute  Fever of 100F or higher  For urgent or emergent issues, a gastroenterologist can be reached at any hour by calling [phone]. Do not use MyChart  messaging for urgent concerns.    DIET:  We do recommend a small meal at first, but then you may proceed to your regular diet.  Drink plenty of fluids but you should avoid alcoholic beverages for 24 hours.  ACTIVITY:  You should plan to take it easy for the rest of today and you should NOT DRIVE or use heavy machinery until tomorrow (because of the sedation medicines used during the test).    FOLLOW UP: Our staff will call the number listed on your records Monday morning between 7:15 am and 8:15 am to check on you and address any questions or concerns that you may have regarding the information given to you following your procedure. If we do not reach you, we will leave a message.  We will attempt to reach you two times.  During this call, we will ask if you have developed any symptoms of COVID 19. If you develop any symptoms (ie: fever, flu-like symptoms, shortness of breath, cough etc.) before then, please call [phone].  If you test positive for Covid 19 in the 2 weeks post procedure, please call and report this information to [place].    If any biopsies were taken you will be contacted by phone or by letter within the next 1-3 weeks.  Please call us at [phone] if you have not heard about the biopsies in 3 weeks.    SIGNATURES/CONFIDENTIALITY: You and/or your care partner have signed paperwork which  will be entered into your electronic medical record.  These signatures attest to the fact that that the information above on your After Visit Summary has been reviewed and is understood.  Full responsibility of the confidentiality of this discharge information lies with you and/or your care-partner.

## 2021-02-03 NOTE — Op Note (Signed)
[facility] Patient Name: Carlos Foster Procedure Date: 02/03/2021 10:39 AM MRN: [id number] Endoscopist: Mallie Mussel L. Loletha Carrow , MD Age: 70 Referring MD:  Date of Birth: 1951/04/16 Gender: Male Account #: [id number] Procedure:                Colonoscopy Indications:              Surveillance: History of adenomatous polyps,                            inadequate prep on last exam (<9yr)                           70mm TA last colonoscopy April 2021 (poor prep) Medicines:                Monitored Anesthesia Care Procedure:                Pre-Anesthesia Assessment:                           - Prior to the procedure, a History and Physical                            was performed, and patient medications and                            allergies were reviewed. The patient's tolerance of                            previous anesthesia was also reviewed. The risks                            and benefits of the procedure and the sedation                            options and risks were discussed with the patient.                            All questions were answered, and informed consent                            was obtained. Prior Anticoagulants: The patient has                            taken no previous anticoagulant or antiplatelet                            agents. ASA Grade Assessment: III - A patient with                            severe systemic disease. After reviewing the risks                            and benefits, the patient was deemed in  satisfactory condition to undergo the procedure.                           After obtaining informed consent, the colonoscope                            was passed under direct vision. Throughout the                            procedure, the patient's blood pressure, pulse, and                            oxygen saturations were monitored continuously. The                            CF [id number] #[id number] was introduced  through the anus                            and advanced to the the terminal ileum, with                            identification of the appendiceal orifice and IC                            valve. The colonoscopy was performed without                            difficulty. The patient tolerated the procedure                            well. The quality of the bowel preparation was                            initiallt fair, then improved to good with lavage.                            The terminal ileum, ileocecal valve, appendiceal                            orifice, and rectum were photographed. The bowel                            preparation used was 2 day Miralax/Moviprep. Scope In: 10:48:36 AM Scope Out: 11:04:58 AM Scope Withdrawal Time: 0 hours 11 minutes 24 seconds  Total Procedure Duration: 0 hours 16 minutes 22 seconds  Findings:                 The perianal and digital rectal examinations were                            normal.                           The terminal ileum appeared normal.  A diminutive polyp was found in the ascending                            colon. The polyp was sessile. The polyp was removed                            with a cold snare. Resection and retrieval were                            complete.                           A few small-mouthed diverticula were found in the                            left colon.                           Anal papilla(e) were hypertrophied.                           The exam was otherwise without abnormality on                            direct and retroflexion views. Complications:            No immediate complications. Estimated Blood Loss:     Estimated blood loss was minimal. Impression:               - The examined portion of the ileum was normal.                           - One diminutive polyp in the ascending colon,                            removed with a cold snare. Resected and  retrieved.                           - Diverticulosis in the left colon.                           - Anal papilla(e) were hypertrophied.                           - The examination was otherwise normal on direct                            and retroflexion views. Recommendation:           - Patient has a contact number available for                            emergencies. The signs and symptoms of potential                            delayed complications were discussed with the  patient. Return to normal activities tomorrow.                            Written discharge instructions were provided to the                            patient.                           - Resume previous diet.                           - Continue present medications.                           - Await pathology results.                           - Repeat colonoscopy is recommended for                            surveillance. The colonoscopy date will be                            determined after pathology results from today's                            exam become available for review. For next                            colonoscopy, 2 day prep with Miralax and 4-liter                            split dose PEG Quintina Hakeem L. Loletha Carrow, MD 02/03/2021 11:10:41 AM This report has been signed electronically.

## 2021-02-06 ENCOUNTER — Other Ambulatory Visit: Payer: Self-pay | Admitting: Family Medicine

## 2021-02-06 DIAGNOSIS — I77811 Abdominal aortic ectasia: Secondary | ICD-10-CM

## 2021-02-06 DIAGNOSIS — I779 Disorder of arteries and arterioles, unspecified: Secondary | ICD-10-CM

## 2021-02-07 ENCOUNTER — Telehealth: Payer: Self-pay

## 2021-02-07 NOTE — Telephone Encounter (Signed)
  Follow up Call-  Call back number 02/03/2021 11/27/2019  Post procedure Call Back phone  # [phone]  Permission to leave phone message Yes Yes  Some recent data might be hidden     Patient questions:  Do you have a fever, pain , or abdominal swelling? No. Pain Score  0 *  Have you tolerated food without any problems? Yes.    Have you been able to return to your normal activities? Yes.    Do you have any questions about your discharge instructions: Diet   No. Medications  No. Follow up visit  No.  Do you have questions or concerns about your Care? No.  Actions: * If pain score is 4 or above: No action needed, pain <4.

## 2021-02-07 NOTE — Telephone Encounter (Signed)
  Follow up Call-  Call back number 02/03/2021 11/27/2019  Post procedure Call Back phone  # [phone]  Permission to leave phone message Yes Yes  Some recent data might be hidden    1st follow up call made. NALM

## 2021-02-15 ENCOUNTER — Encounter: Payer: Self-pay | Admitting: Gastroenterology

## 2021-03-17 ENCOUNTER — Other Ambulatory Visit: Payer: Self-pay

## 2021-03-17 ENCOUNTER — Ambulatory Visit
Admission: RE | Admit: 2021-03-17 | Discharge: 2021-03-17 | Disposition: A | Discharge status: Home / Self Care | Payer: Medicare Other | Source: Ambulatory Visit | Attending: Family Medicine | Admitting: Family Medicine

## 2021-03-17 DIAGNOSIS — I77819 Aortic ectasia, unspecified site: Secondary | ICD-10-CM | POA: Diagnosis not present

## 2021-03-17 DIAGNOSIS — I779 Disorder of arteries and arterioles, unspecified: Secondary | ICD-10-CM

## 2021-03-17 DIAGNOSIS — I77811 Abdominal aortic ectasia: Secondary | ICD-10-CM

## 2021-03-17 DIAGNOSIS — I714 Abdominal aortic aneurysm, without rupture: Secondary | ICD-10-CM | POA: Diagnosis not present

## 2021-03-31 ENCOUNTER — Telehealth: Payer: Self-pay | Admitting: Family Medicine

## 2021-03-31 ENCOUNTER — Other Ambulatory Visit: Payer: Self-pay | Admitting: Family Medicine

## 2021-03-31 DIAGNOSIS — I10 Essential (primary) hypertension: Secondary | ICD-10-CM

## 2021-03-31 MED ORDER — SPIRONOLACTONE 25 MG PO TABS: 25.0000 mg | ORAL_TABLET | Freq: Every day | ORAL | Status: DC

## 2021-03-31 NOTE — Telephone Encounter (Signed)
Please triage patient about heart rate.  Thanks.

## 2021-03-31 NOTE — Telephone Encounter (Signed)
I spoke with pt; pt is usually 80 -90. Now in 50's and 27's and when works out heart rate will not go above 90.  Pt retired 01/28/21 and works out 3- 4 times a wk 30 - 60 mins each session. No CP or SOB, No H/A, vision changes or dizziness. Pt has heart skipping on and off but that is not new.  BP now is 109/43 P 66. Pt said usual BP is 140/88. Dr Katherene Ponto going to speak with pt now.

## 2021-04-01 ENCOUNTER — Other Ambulatory Visit (INDEPENDENT_AMBULATORY_CARE_PROVIDER_SITE_OTHER): Payer: Medicare Other

## 2021-04-01 ENCOUNTER — Other Ambulatory Visit: Payer: Self-pay

## 2021-04-01 DIAGNOSIS — I10 Essential (primary) hypertension: Secondary | ICD-10-CM | POA: Diagnosis not present

## 2021-04-01 NOTE — Telephone Encounter (Signed)
Late entry.  D/w pt about options.  No chest pain at rest or with exercise.  His blood pressure is lower than typical.  Discussed cutting spironolactone from 50 down to 25 mg a day.  He will come in for labs Friday morning and we will go from there.  He can check his blood pressure over the next few days and update me as needed about that.  He agrees with plan.

## 2021-04-01 NOTE — Addendum Note (Signed)
Addended by: Ellamae Sia on: 04/01/2021 08:11 AM   Modules accepted: Orders

## 2021-04-02 LAB — CBC WITH DIFFERENTIAL/PLATELET
Basophils Absolute: 0 10*3/uL (ref 0.0–0.2)
Basos: 1 %
EOS (ABSOLUTE): 0.1 10*3/uL (ref 0.0–0.4)
Eos: 2 %
Hematocrit: 40.9 % (ref 37.5–51.0)
Hemoglobin: 13.6 g/dL (ref 13.0–17.7)
Immature Grans (Abs): 0 10*3/uL (ref 0.0–0.1)
Immature Granulocytes: 0 %
Lymphocytes Absolute: 1.8 10*3/uL (ref 0.7–3.1)
Lymphs: 31 %
MCH: 30 pg (ref 26.6–33.0)
MCHC: 33.3 g/dL (ref 31.5–35.7)
MCV: 90 fL (ref 79–97)
Monocytes Absolute: 0.7 10*3/uL (ref 0.1–0.9)
Monocytes: 13 %
Neutrophils Absolute: 3 10*3/uL (ref 1.4–7.0)
Neutrophils: 53 %
Platelets: 199 10*3/uL (ref 150–450)
RBC: 4.54 x10E6/uL (ref 4.14–5.80)
RDW: 12.6 % (ref 11.6–15.4)
WBC: 5.7 10*3/uL (ref 3.4–10.8)

## 2021-04-02 LAB — BASIC METABOLIC PANEL
BUN/Creatinine Ratio: 17 (ref 10–24)
BUN: 23 mg/dL (ref 8–27)
CO2: 19 mmol/L — ABNORMAL LOW (ref 20–29)
Calcium: 9.3 mg/dL (ref 8.6–10.2)
Chloride: 105 mmol/L (ref 96–106)
Creatinine, Ser: 1.38 mg/dL — ABNORMAL HIGH (ref 0.76–1.27)
Glucose: 118 mg/dL — ABNORMAL HIGH (ref 65–99)
Potassium: 4.5 mmol/L (ref 3.5–5.2)
Sodium: 140 mmol/L (ref 134–144)
eGFR: 56 mL/min/{1.73_m2} — ABNORMAL LOW (ref >59)

## 2021-04-02 LAB — TSH: TSH: 0.349 u[IU]/mL — ABNORMAL LOW (ref 0.450–4.500)

## 2021-04-04 ENCOUNTER — Other Ambulatory Visit: Payer: Self-pay | Admitting: Family Medicine

## 2021-04-04 DIAGNOSIS — I10 Essential (primary) hypertension: Secondary | ICD-10-CM

## 2021-04-12 ENCOUNTER — Other Ambulatory Visit: Payer: Self-pay

## 2021-04-12 ENCOUNTER — Ambulatory Visit (INDEPENDENT_AMBULATORY_CARE_PROVIDER_SITE_OTHER)
Admission: RE | Admit: 2021-04-12 | Discharge: 2021-04-12 | Disposition: A | Discharge status: Home / Self Care | Payer: Medicare Other | Source: Ambulatory Visit | Attending: Family Medicine | Admitting: Family Medicine

## 2021-04-12 ENCOUNTER — Ambulatory Visit (INDEPENDENT_AMBULATORY_CARE_PROVIDER_SITE_OTHER): Payer: Medicare Other | Admitting: Family Medicine

## 2021-04-12 ENCOUNTER — Encounter: Payer: Self-pay | Admitting: Family Medicine

## 2021-04-12 VITALS — BP 132/82 | HR 60 | Temp 97.6°F | Ht 73.0 in | Wt 282.0 lb

## 2021-04-12 DIAGNOSIS — R001 Bradycardia, unspecified: Secondary | ICD-10-CM

## 2021-04-12 DIAGNOSIS — I77811 Abdominal aortic ectasia: Secondary | ICD-10-CM | POA: Diagnosis not present

## 2021-04-12 DIAGNOSIS — M898X1 Other specified disorders of bone, shoulder: Secondary | ICD-10-CM | POA: Diagnosis not present

## 2021-04-12 DIAGNOSIS — I10 Essential (primary) hypertension: Secondary | ICD-10-CM

## 2021-04-12 DIAGNOSIS — M19012 Primary osteoarthritis, left shoulder: Secondary | ICD-10-CM | POA: Diagnosis not present

## 2021-04-12 NOTE — Patient Instructions (Addendum)
Go to the lab on the way out.   If you have mychart we'll likely use that to update you.    Take care.  Glad to see you. We'll call about seeing cardiology.

## 2021-04-12 NOTE — Progress Notes (Signed)
This visit occurred during the SARS-CoV-2 public health emergency.  Safety protocols were in place, including screening questions prior to the visit, additional usage of staff PPE, and extensive cleaning of exam room while observing appropriate contact time as indicated for disinfecting solutions.  Hypertension:    Using medication without problems or lightheadedness: yes Chest pain with exertion:no Edema:no Short of breath:no  Currently on '25mg'$  spironolactone daily.  '40mg'$  quinapril.  87mq BID of K.  Still on caduet 10/'10mg'$ .    He had been exercising but couldn't get his pulse to go above 90s with exercise.  He had lower resting pulse, down to the 40s.  Occ skips noted.  He had covid in June.  Then noted relative dec in heart rate in July.  No syncope.   L medial collarbone puffy with crepitus on ROM.    He'll need f/u AAA eval by u/s in about 3 years, d/w pt.    Meds, vitals, and allergies reviewed.   ROS: Per HPI unless specifically indicated in ROS section   GEN: nad, alert and oriented HEENT: ncat NECK: supple w/o LA L medial collarbone puffy with crepitus on ROM.   R medial collarbone not ttp or puffy CV: rrr. PULM: ctab, no inc wob ABD: soft, +bs EXT: no edema SKIN: well perfused.    EKG d/w pt at OV.   30 minutes were devoted to patient care in this encounter (this includes time spent reviewing the patient's file/history, interviewing and examining the patient, counseling/reviewing plan with patient).

## 2021-04-12 NOTE — Addendum Note (Signed)
Addended by: Ellamae Sia on: 04/12/2021 02:36 PM   Modules accepted: Orders

## 2021-04-13 DIAGNOSIS — M898X1 Other specified disorders of bone, shoulder: Secondary | ICD-10-CM | POA: Insufficient documentation

## 2021-04-13 DIAGNOSIS — R001 Bradycardia, unspecified: Secondary | ICD-10-CM | POA: Insufficient documentation

## 2021-04-13 NOTE — Assessment & Plan Note (Signed)
With inability to get pulse >100 with exercise.  No acute ST changes on EKG.  Refer to cards.  Recheck basic labs today.  He agrees with plan.  No CP, still okay for outpatient f/u.

## 2021-04-13 NOTE — Assessment & Plan Note (Signed)
Concern for  joint arthritis on L side, see notes on imaging.

## 2021-04-13 NOTE — Assessment & Plan Note (Signed)
Can recheck in about 3 years with u/s, d/w pt.

## 2021-04-14 LAB — BASIC METABOLIC PANEL
BUN/Creatinine Ratio: 11 (ref 10–24)
BUN: 12 mg/dL (ref 8–27)
CO2: 22 mmol/L (ref 20–29)
Calcium: 9.2 mg/dL (ref 8.6–10.2)
Chloride: 105 mmol/L (ref 96–106)
Creatinine, Ser: 1.08 mg/dL (ref 0.76–1.27)
Glucose: 102 mg/dL — ABNORMAL HIGH (ref 65–99)
Potassium: 4.3 mmol/L (ref 3.5–5.2)
Sodium: 143 mmol/L (ref 134–144)
eGFR: 75 mL/min/{1.73_m2} (ref >59)

## 2021-05-27 NOTE — Progress Notes (Signed)
Cardiology Office Note   Date:  06/10/2021   ID:  Carlos Foster, DOB 1951-03-10, MRN [id number]  PCP:  Tonia Ghent, MD  Cardiologist:   Merelyn Klump [place], MD   Chief Complaint  Patient presents with   Palpitations      History of Present Illness: Carlos Foster is a 70 y.o. male who is seen at the request of Dr Damita Dunnings for evaluation of bradycardia. He has a history of HTN, HLD and hypothyroidism. Bicycle stress test in 2017 was normal.   He reports that he had Covid 19 in June. In July he noted his HR dropped consistently into the low 40s. Previously his resting HR would be in the 80s. When he works out in [employer] he can only get his HR up to 104. He feels that he gets to a point where he has to slow down- some fatigue. No dizziness, lightheadedness or syncope. Notes no chest pain or dyspnea. He has also noted more palpitations - skipped beats in his rhythm. He does work out regularly and doesn't note any difficulty with weight training. More notices issues with aerobic training.     Past Medical History:  Diagnosis Date   Arthritis    feet   GERD (gastroesophageal reflux disease)    History of hiatal hernia    Hyperlipidemia    Hypertension    Hypokalemia    Hypothyroidism    Kidney stones    Rosacea     Past Surgical History:  Procedure Laterality Date   ACHILLES TENDON SURGERY Left 06/01/2020   C2 C3 Laminectomy  07/09/2016   COLONOSCOPY     COLONOSCOPY  11/2019   ETT  04/15/03   wnl ST decreased < 65mm V6   EVACUATION OF CERVICAL HEMATOMA N/A 07/21/2016   Procedure: RE-EXPLORATION OF ANTERIOR CERVICAL WOUND AND EVACUATION OF CERVICAL HEMATOMA;  Surgeon: Kary Kos, MD;  Location: [facility];  Service: Neurosurgery;  Laterality: N/A;   HAMMER TOE SURGERY Left 01/06/2016   Procedure: LEFT SECOND HAMMER TOE CORRECTION;  Surgeon: Wylene Simmer, MD;  Location: [facility];  Service: Orthopedics;  Laterality: Left;   hematoma removal     around larnyx   [place] Bilateral 2015   inguinal   LAMINECTOMY  12/02/99   C4/5, 5/6, 6/7 Fusion (Dr. Joya Salm)   LUMBAR LAMINECTOMY/DECOMPRESSION MICRODISCECTOMY Left 03/02/2017   Procedure: Laminectomy and Foraminotomy - L2-L3 - L3-L4 - left;  Surgeon: Kary Kos, MD;  Location: [facility];  Service: Neurosurgery;  Laterality: Left;   METATARSAL OSTEOTOMY Left 01/06/2016   Procedure: LEFT SECOND METATARSAL WEIL OSTEOTOMY;  Surgeon: Wylene Simmer, MD;  Location: [facility];  Service: Orthopedics;  Laterality: Left;   Morton's Neuroma Repair Right 1982   foot   PROSTATE BIOPSY  12/10/06   Benign Jeffie Pollock)     Current Outpatient Medications  Medication Sig Dispense Refill   acetaminophen (TYLENOL) 500 MG tablet Take 500 mg by mouth every 6 (six) hours as needed.     amlodipine-atorvastatin (CADUET) 10-10 MG tablet Take 1 tablet by mouth daily. 90 tablet 3   aspirin EC 81 MG tablet Take 81 mg by mouth daily.     cyclobenzaprine (FLEXERIL) 10 MG tablet TAKE 1 TABLET BY MOUTH AT BEDTIME AS NEEDED FOR MUSCLE SPASMS (SEDATION CAUTION). 90 tablet 3   doxycycline (VIBRAMYCIN) 100 MG capsule Take 1 capsule (100 mg total) by mouth 2 (two) times daily as  needed. For rosacea flare ups 60 capsule 3   levothyroxine (SYNTHROID) 150 MCG tablet Take 1 tablet (150 mcg total) by mouth daily before breakfast. 90 tablet 3   metroNIDAZOLE (METROGEL) 1 % gel Apply 1 application  topically daily as needed for rosacea. 60 g 3   naproxen sodium (ALEVE) 220 MG tablet Take 220 mg by mouth daily as needed.     Polyethyl Glycol-Propyl Glycol 0.4-0.3 % SOLN Place 1 drop into both eyes 3 (three) times daily as needed (for dry eyes/contact irritation).     potassium chloride SA (KLOR-CON) 20 MEQ tablet TAKE 2 TABLETS BY MOUTH TWICE A DAY 360 tablet 3   quinapril (ACCUPRIL) 40 MG tablet Take 1 tablet (40 mg total) by mouth at bedtime. 90 tablet 3   spironolactone (ALDACTONE) 25 MG tablet Take 1 tablet (25 mg  total) by mouth daily.     Turmeric (QC TUMERIC COMPLEX PO) Take by mouth.     No current facility-administered medications for this visit.    Allergies:   Gabapentin and Lyrica [pregabalin]    Social History:  The patient  reports that he has never smoked. He has never used smokeless tobacco. He reports that he does not drink alcohol and does not use drugs.   Family History:  The patient's family history includes AAA (abdominal aortic aneurysm) in his father; Colon polyps in his father; Diabetes in his mother and another family member; Heart disease in his father and mother; Hypertension in his mother; Kidney disease in his mother; Lymphoma in his maternal uncle.    ROS:  Please see the history of present illness.   Otherwise, review of systems are positive for none.   All other systems are reviewed and negative.    PHYSICAL EXAM: VS:  BP (!) 148/72   Pulse 65   Ht 6\' 1"  (1.854 m)   Wt 287 lb 3.2 oz (130.3 kg)   SpO2 99%   BMI 37.89 kg/m  , BMI Body mass index is 37.89 kg/m. GEN: Well nourished, overweight, in no acute distress HEENT: normal Neck: no JVD, carotid bruits, or masses Cardiac: RRR; no murmurs, rubs, or gallops,no edema  Respiratory:  clear to auscultation bilaterally, normal work of breathing GI: soft, nontender, nondistended, + BS MS: no deformity or atrophy Skin: warm and dry, no rash Neuro:  Strength and sensation are intact Psych: euthymic mood, full affect   EKG:  EKG is ordered today. The ekg ordered today demonstrates Junctional rhythm (no P waves seen). Rate 65. Frequent PVCs unifocal. Nonspecific ST abnormality. I have personally reviewed and interpreted this study. Ecg from 04/13/21: Sinus brady rate 56 with sinus arrhythmia. Ecg from 03/01/17 showed NSR rate 91.    Recent Labs: 11/02/2020: ALT 24 04/01/2021: Hemoglobin 13.6; Platelets 199; TSH 0.349 04/12/2021: BUN 12; Creatinine, Ser 1.08; Potassium 4.3; Sodium 143    Lipid Panel    Component  Value Date/Time   CHOL 145 11/02/2020 0851   CHOL 145 05/12/2016 0000   TRIG 131.0 11/02/2020 0851   TRIG 101 05/12/2016 0000   HDL 42.30 11/02/2020 0851   HDL 39 05/12/2016 0000   CHOLHDL 3 11/02/2020 0851   VLDL 26.2 11/02/2020 0851   LDLCALC 76 11/02/2020 0851   LDLCALC 86 05/12/2016 0000      Wt Readings from Last 3 Encounters:  06/10/21 287 lb 3.2 oz (130.3 kg)  04/12/21 282 lb (127.9 kg)  02/03/21 294 lb (133.4 kg)      Other studies Reviewed:  Additional studies/ records that were reviewed today include: none. Review of the above records demonstrates: N/A   ASSESSMENT AND PLAN:  1.  Bradycardia recent onset. Ecg in September showed sinus brady. Today he has a junctional rhythm with frequent PVCs. He may have some degree of chronotropic incompetence. He is on no rate slowing medication and labs are OK. Recommend Echo to assess LV function. Will have him wear a 3 day Zio patch monitor. Further recommendations pending these results.  2 .HTN 3. HLD. On statin 4. Hypothyroidism. TSH normal on replacement.   Current medicines are reviewed at length with the patient todyperay.  The patient does not have concerns regarding medicines.  The following changes have been made:  no change  Labs/ tests ordered today include:   Orders Placed This Encounter  Procedures   Holter monitor - 48 hour   EKG 12-Lead   ECHOCARDIOGRAM COMPLETE      Disposition:   FU with me after above studies.   Signed, Hildy Nicholl [place], MD  06/10/2021 8:21 AM    [facility] [address], [place], [place], [address] Phone [phone], Fax [phone]

## 2021-06-10 ENCOUNTER — Ambulatory Visit (INDEPENDENT_AMBULATORY_CARE_PROVIDER_SITE_OTHER): Payer: Medicare Other

## 2021-06-10 ENCOUNTER — Ambulatory Visit (INDEPENDENT_AMBULATORY_CARE_PROVIDER_SITE_OTHER): Payer: Medicare Other | Admitting: Cardiology

## 2021-06-10 ENCOUNTER — Other Ambulatory Visit: Payer: Self-pay

## 2021-06-10 ENCOUNTER — Encounter: Payer: Self-pay | Admitting: Cardiology

## 2021-06-10 VITALS — BP 148/72 | HR 65 | Ht 73.0 in | Wt 287.2 lb

## 2021-06-10 DIAGNOSIS — I10 Essential (primary) hypertension: Secondary | ICD-10-CM

## 2021-06-10 DIAGNOSIS — E7800 Pure hypercholesterolemia, unspecified: Secondary | ICD-10-CM

## 2021-06-10 DIAGNOSIS — R001 Bradycardia, unspecified: Secondary | ICD-10-CM

## 2021-06-10 DIAGNOSIS — I493 Ventricular premature depolarization: Secondary | ICD-10-CM

## 2021-06-10 NOTE — Progress Notes (Unsigned)
Enrolled patient for a 3 day Zio XT monitor to be mailed to patients home  

## 2021-06-10 NOTE — Patient Instructions (Signed)
Medication Instructions:  Continue same medications *If you need a refill on your cardiac medications before your next appointment, please call your pharmacy*   Lab Work: None ordered   Testing/Procedures: Echo  48 hour holter monitor   Follow-Up: At [facility], you and your health needs are our priority.  As part of our continuing mission to provide you with exceptional heart care, we have created designated Provider Care Teams.  These Care Teams include your primary Cardiologist (physician) and Advanced Practice Providers (APPs -  Physician Assistants and Nurse Practitioners) who all work together to provide you with the care you need, when you need it.  We recommend signing up for the patient portal called "MyChart".  Sign up information is provided on this After Visit Summary.  MyChart is used to connect with patients for Virtual Visits (Telemedicine).  Patients are able to view lab/test results, encounter notes, upcoming appointments, etc.  Non-urgent messages can be sent to your provider as well.   To learn more about what you can do with MyChart, go to [email or web address].      Your next appointment:  To be determined after test    The format for your next appointment: Office    Provider:  Dr.Jordan

## 2021-06-13 DIAGNOSIS — R001 Bradycardia, unspecified: Secondary | ICD-10-CM

## 2021-06-13 DIAGNOSIS — I493 Ventricular premature depolarization: Secondary | ICD-10-CM | POA: Diagnosis not present

## 2021-06-13 DIAGNOSIS — E7800 Pure hypercholesterolemia, unspecified: Secondary | ICD-10-CM | POA: Diagnosis not present

## 2021-06-13 DIAGNOSIS — I10 Essential (primary) hypertension: Secondary | ICD-10-CM | POA: Diagnosis not present

## 2021-06-24 DIAGNOSIS — I493 Ventricular premature depolarization: Secondary | ICD-10-CM | POA: Diagnosis not present

## 2021-06-24 DIAGNOSIS — R001 Bradycardia, unspecified: Secondary | ICD-10-CM | POA: Diagnosis not present

## 2021-06-24 DIAGNOSIS — I10 Essential (primary) hypertension: Secondary | ICD-10-CM | POA: Diagnosis not present

## 2021-06-24 DIAGNOSIS — E7800 Pure hypercholesterolemia, unspecified: Secondary | ICD-10-CM | POA: Diagnosis not present

## 2021-06-27 ENCOUNTER — Telehealth: Payer: Self-pay | Admitting: Cardiology

## 2021-06-27 NOTE — Telephone Encounter (Signed)
Carlos Foster is returning Carlos Foster's call.

## 2021-06-27 NOTE — Telephone Encounter (Signed)
Spoke to patient monitor results given. 

## 2021-07-05 ENCOUNTER — Ambulatory Visit (HOSPITAL_COMMUNITY): Payer: Medicare Other | Attending: Cardiology

## 2021-07-05 ENCOUNTER — Other Ambulatory Visit: Payer: Self-pay

## 2021-07-05 DIAGNOSIS — R001 Bradycardia, unspecified: Secondary | ICD-10-CM | POA: Insufficient documentation

## 2021-07-05 DIAGNOSIS — I493 Ventricular premature depolarization: Secondary | ICD-10-CM | POA: Insufficient documentation

## 2021-07-05 DIAGNOSIS — I10 Essential (primary) hypertension: Secondary | ICD-10-CM | POA: Diagnosis not present

## 2021-07-05 DIAGNOSIS — E7800 Pure hypercholesterolemia, unspecified: Secondary | ICD-10-CM | POA: Insufficient documentation

## 2021-07-05 LAB — ECHOCARDIOGRAM COMPLETE
Area-P 1/2: 3.12 cm2
S' Lateral: 2.9 cm

## 2021-07-07 ENCOUNTER — Telehealth: Payer: Self-pay

## 2021-07-07 NOTE — Telephone Encounter (Signed)
Spoke to patient Dr.Jordan advised to see him back in 1 year.Advised to call sooner if needed.

## 2021-08-24 DIAGNOSIS — R972 Elevated prostate specific antigen [PSA]: Secondary | ICD-10-CM | POA: Diagnosis not present

## 2021-08-31 DIAGNOSIS — R3912 Poor urinary stream: Secondary | ICD-10-CM | POA: Diagnosis not present

## 2021-08-31 DIAGNOSIS — N401 Enlarged prostate with lower urinary tract symptoms: Secondary | ICD-10-CM | POA: Diagnosis not present

## 2021-08-31 DIAGNOSIS — R972 Elevated prostate specific antigen [PSA]: Secondary | ICD-10-CM | POA: Diagnosis not present

## 2021-08-31 DIAGNOSIS — R3915 Urgency of urination: Secondary | ICD-10-CM | POA: Diagnosis not present

## 2021-08-31 DIAGNOSIS — Z87442 Personal history of urinary calculi: Secondary | ICD-10-CM | POA: Diagnosis not present

## 2021-09-15 IMAGING — US US AORTA
1 series · 11 of 11 positions shown · non-contrast
Comparison: CT abdomen 11/06/2019

CLINICAL DATA: Aortic ectasia, abdominal.

EXAM:
ULTRASOUND OF ABDOMINAL AORTA
TECHNIQUE: Ultrasound examination of the abdominal aorta and proximal common
iliac arteries was performed to evaluate for aneurysm. Additional
color and Doppler images of the distal aorta were obtained to
document patency.

[Series 1: us aorta · 0.39mm/px · 11 of 11 slices shown]
[im 1/11]
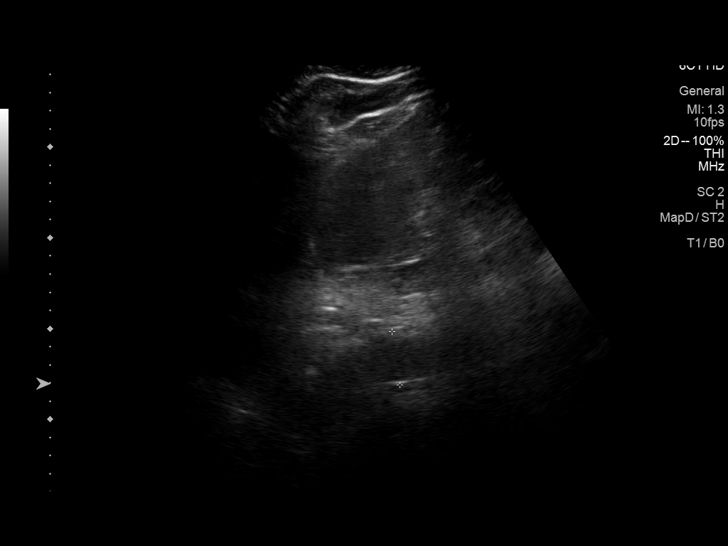
[im 2/11]
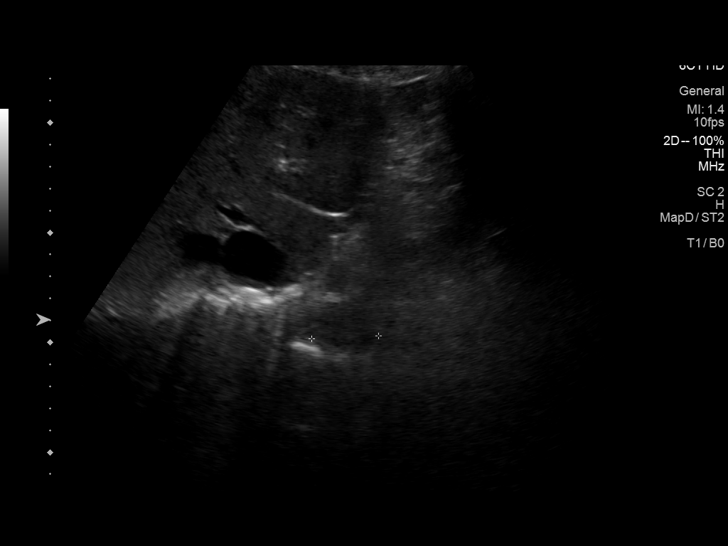
[im 3/11]
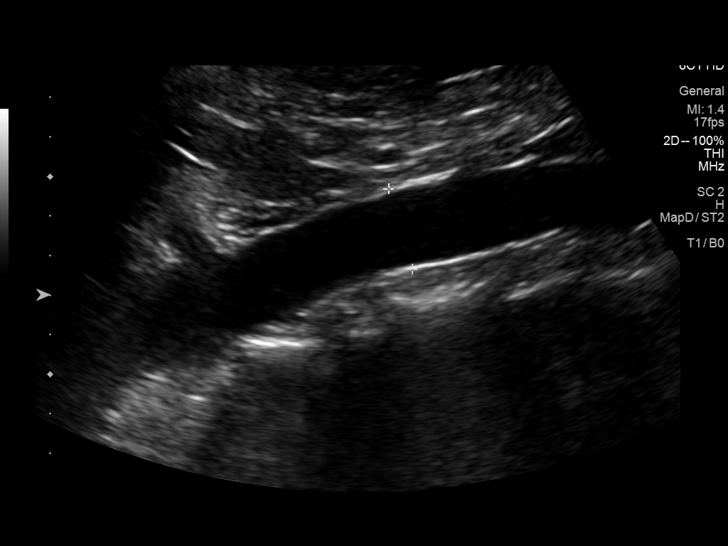
[im 4/11]
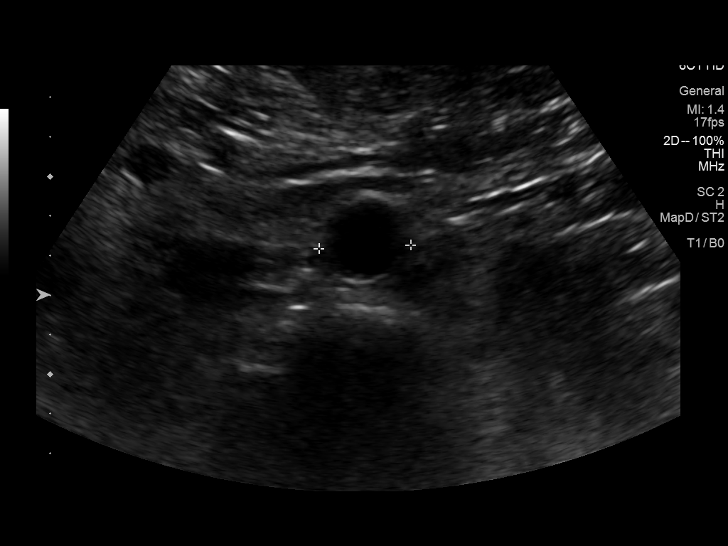
[im 5/11]
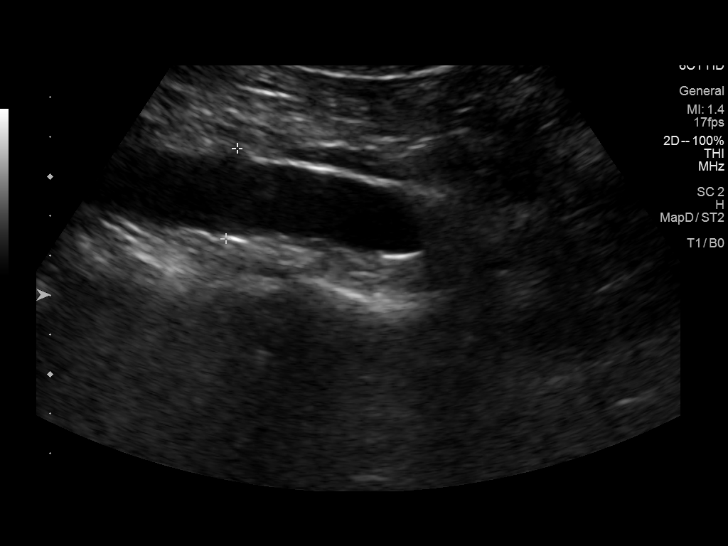
[im 6/11]
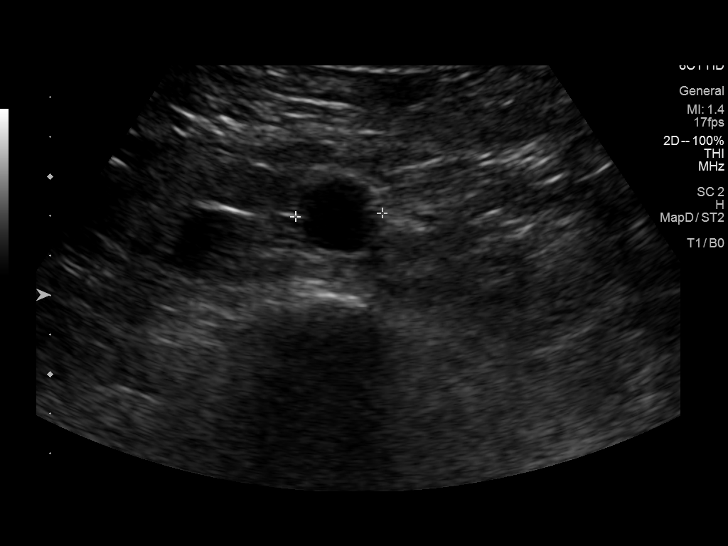
[im 7/11]
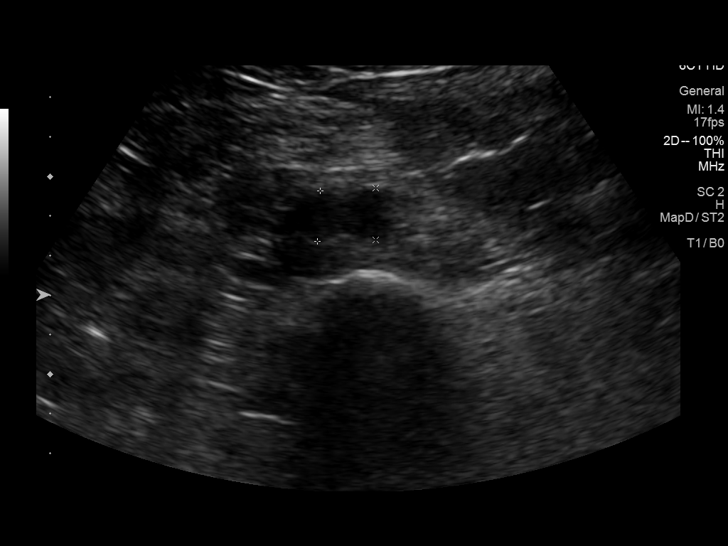
[im 8/11]
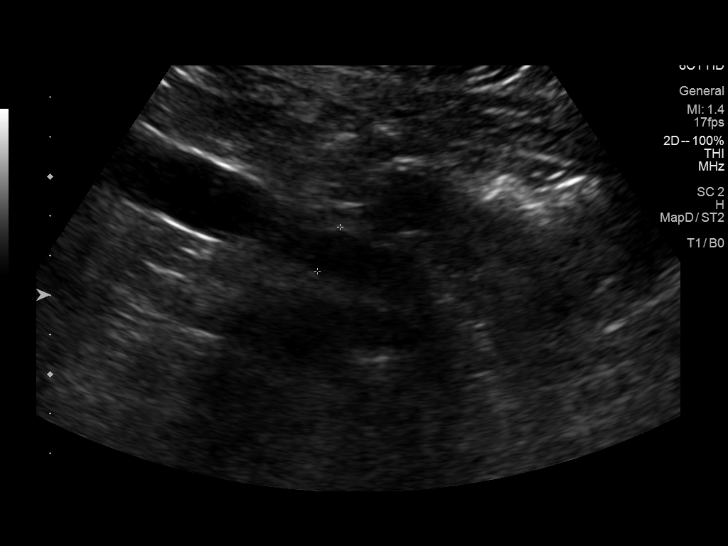
[im 9/11]
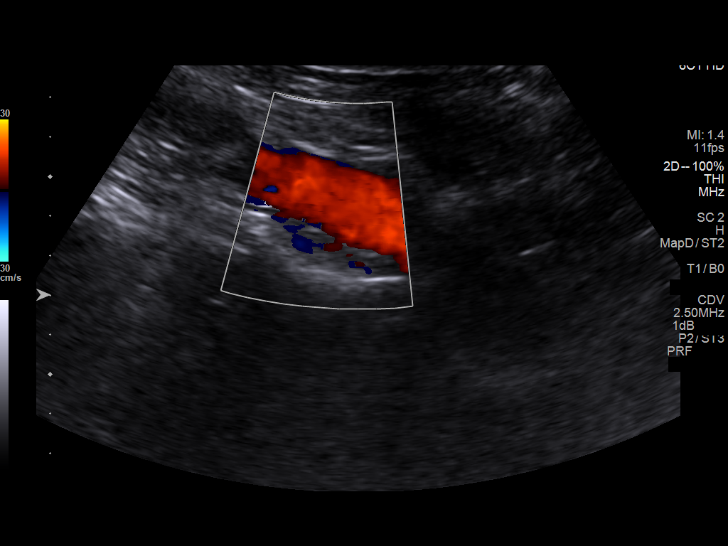
[im 10/11]
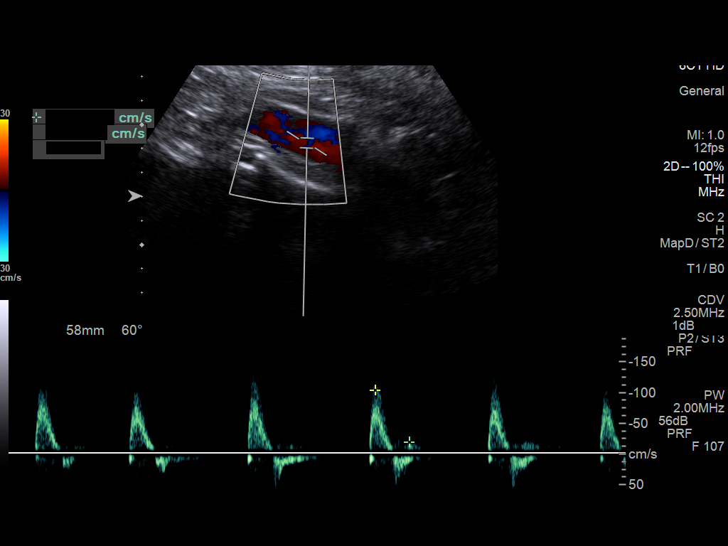
[im 11/11]
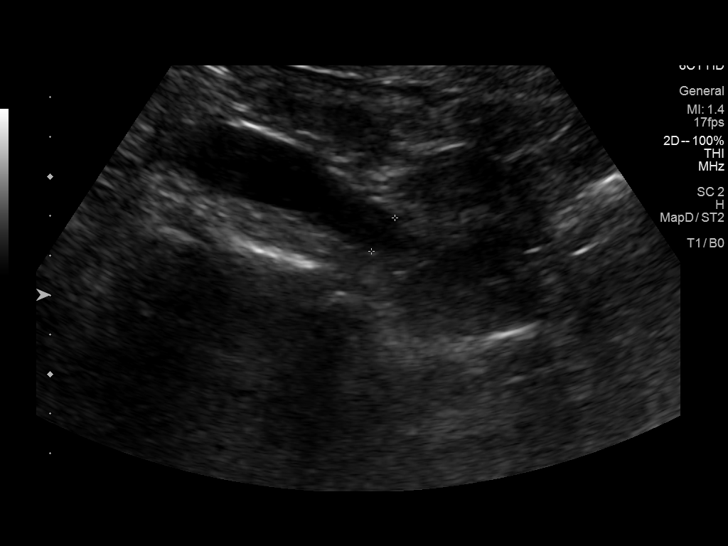

[11 of 11 positions shown; findings below may reference images not displayed]

FINDINGS: Abdominal aortic measurements as follows:

Proximal:  3.0 cm

Mid:  2.3 cm

Distal:  2.3 cm
Patent: Yes, peak systolic velocity is 104 cm/s

Right common iliac artery: 1.3 cm

Left common iliac artery: 1.3 cm
IMPRESSION: Proximal abdominal aorta is mildly aneurysmal measuring up to
cm. Recommend follow-up ultrasound every 3 years. This
recommendation follows ACR consensus guidelines: White Paper of the
ACR Incidental Findings Committee II on Vascular Findings. [HOSPITAL] 7462; [DATE].

## 2021-09-20 ENCOUNTER — Other Ambulatory Visit: Payer: Self-pay | Admitting: Family Medicine

## 2021-09-20 DIAGNOSIS — E038 Other specified hypothyroidism: Secondary | ICD-10-CM

## 2021-09-30 DIAGNOSIS — N401 Enlarged prostate with lower urinary tract symptoms: Secondary | ICD-10-CM | POA: Diagnosis not present

## 2021-09-30 DIAGNOSIS — R3915 Urgency of urination: Secondary | ICD-10-CM | POA: Diagnosis not present

## 2021-09-30 DIAGNOSIS — R3912 Poor urinary stream: Secondary | ICD-10-CM | POA: Diagnosis not present

## 2021-10-11 IMAGING — DX DG CLAVICLE*L*
2 series · 2 of 2 positions shown · non-contrast
Comparison: None.

CLINICAL DATA: Sternoclavicular joint pain and puffiness

EXAM:
LEFT CLAVICLE - 2+ VIEWS

[clavicle ap (1 of 2)]
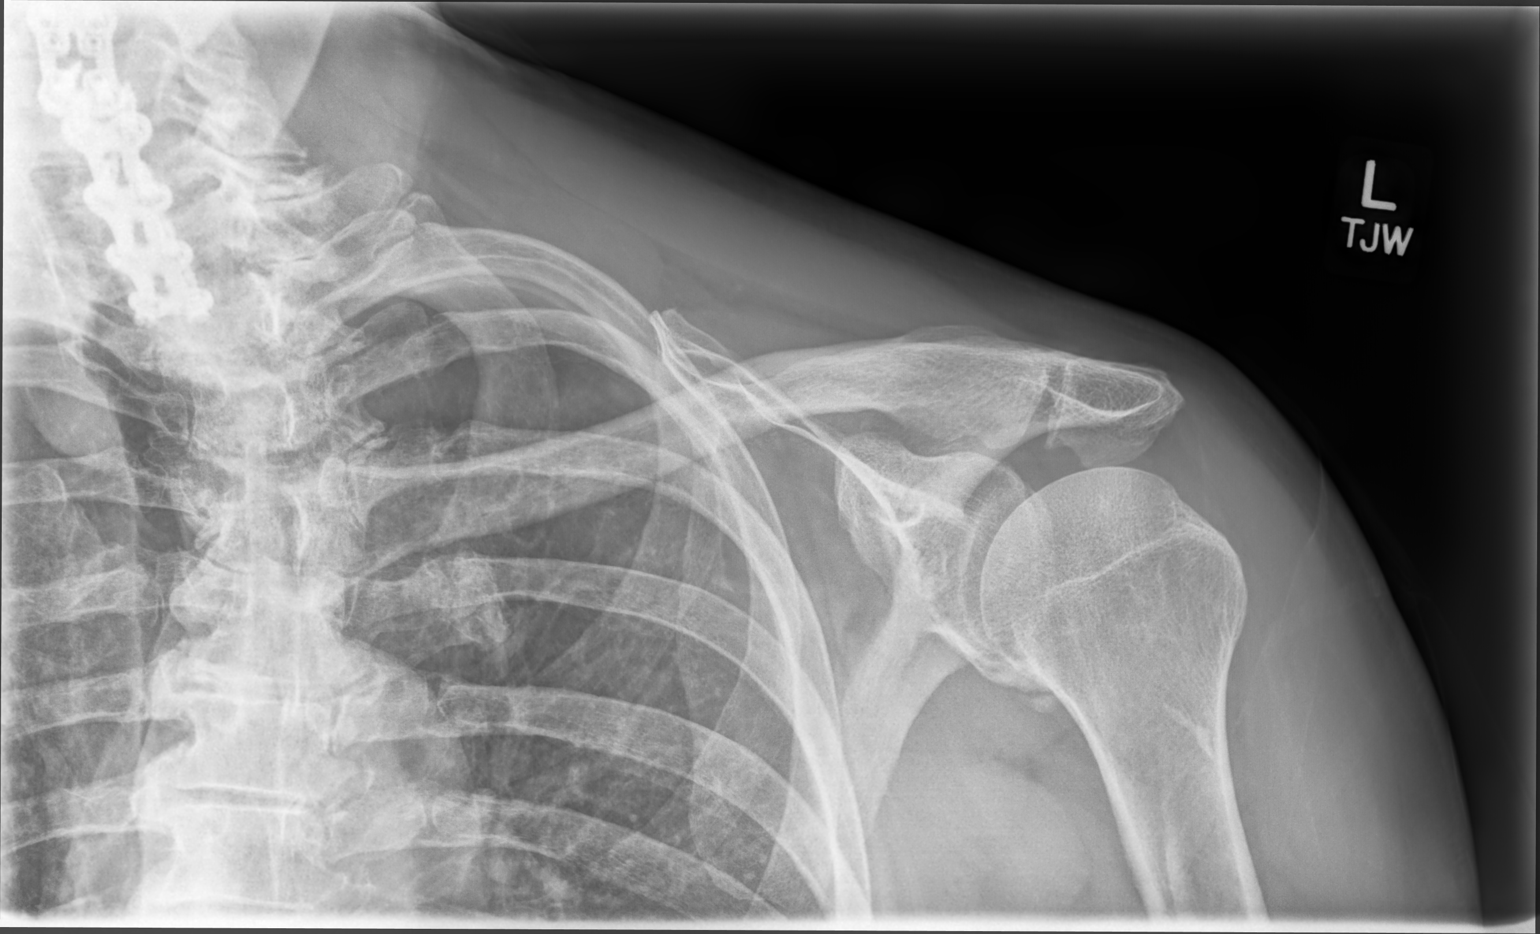

[clavicle ap (2 of 2)]
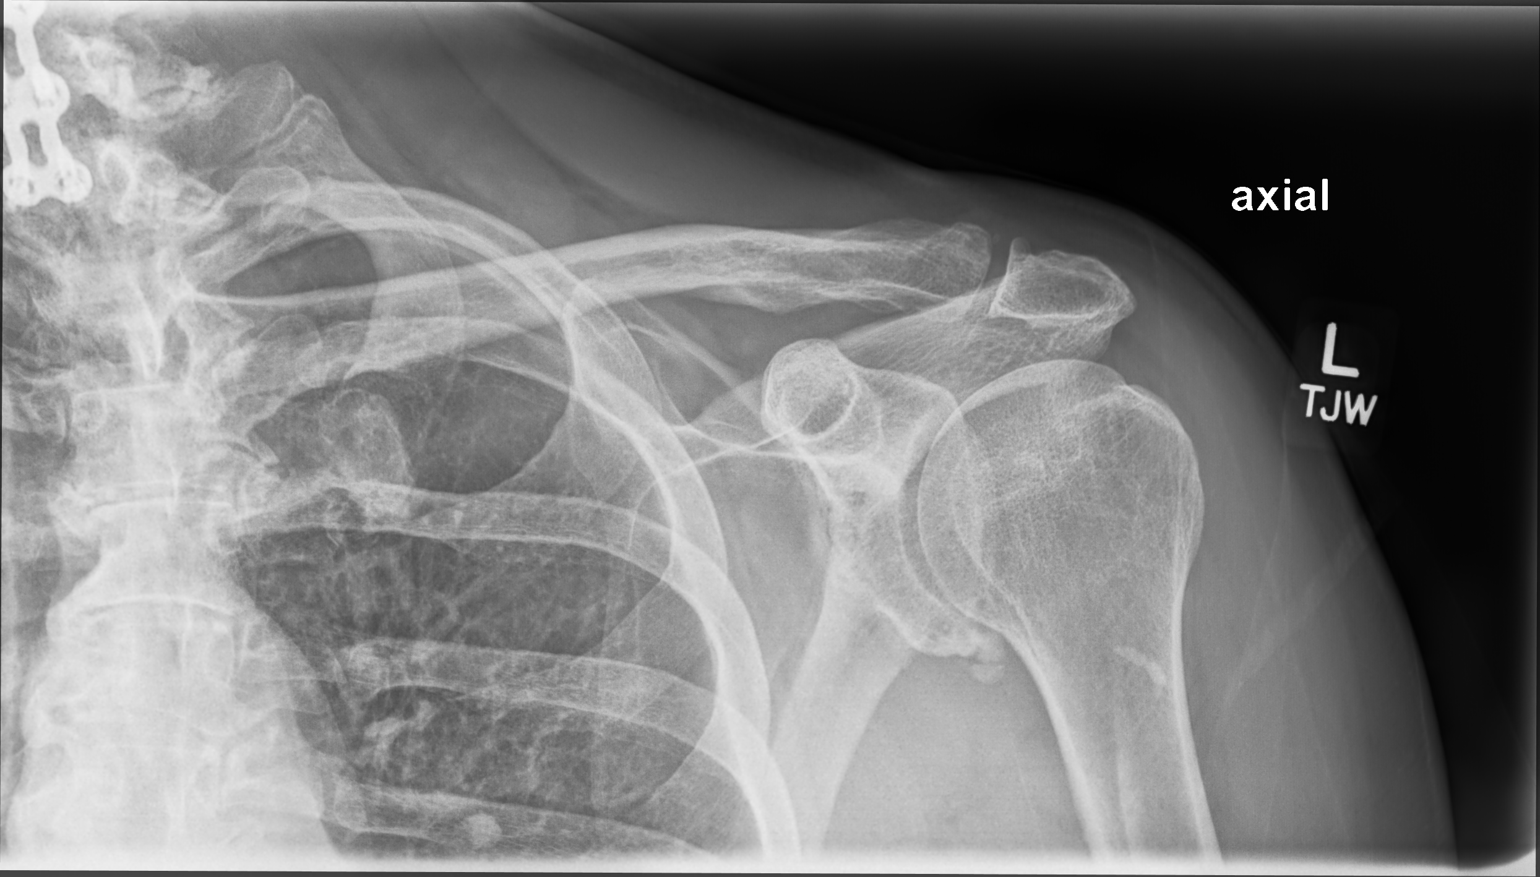

[2 of 2 positions shown; findings below may reference images not displayed]

FINDINGS: There is no evidence of fracture or other focal bone lesions. Soft
tissues are unremarkable. Mild AC joint degenerative change.
Inferior glenoid rim osteophyte versus remote injury versus small
loose body.
IMPRESSION: Mild AC joint degenerative change. Left clavicle is otherwise
unremarkable

## 2021-10-23 ENCOUNTER — Other Ambulatory Visit: Payer: Self-pay | Admitting: Family Medicine

## 2021-10-23 DIAGNOSIS — I10 Essential (primary) hypertension: Secondary | ICD-10-CM

## 2021-10-23 DIAGNOSIS — Z125 Encounter for screening for malignant neoplasm of prostate: Secondary | ICD-10-CM

## 2021-10-24 ENCOUNTER — Other Ambulatory Visit: Payer: Self-pay | Admitting: Family Medicine

## 2021-11-02 ENCOUNTER — Other Ambulatory Visit (INDEPENDENT_AMBULATORY_CARE_PROVIDER_SITE_OTHER): Payer: Medicare Other

## 2021-11-02 DIAGNOSIS — I10 Essential (primary) hypertension: Secondary | ICD-10-CM | POA: Diagnosis not present

## 2021-11-02 DIAGNOSIS — R972 Elevated prostate specific antigen [PSA]: Secondary | ICD-10-CM

## 2021-11-02 DIAGNOSIS — Z125 Encounter for screening for malignant neoplasm of prostate: Secondary | ICD-10-CM

## 2021-11-02 NOTE — Addendum Note (Signed)
Addended by: Ellamae Sia on: 11/02/2021 08:55 AM ? ? Modules accepted: Orders ? ?

## 2021-11-03 LAB — COMPREHENSIVE METABOLIC PANEL
ALT: 31 IU/L (ref 0–44)
AST: 30 IU/L (ref 0–40)
Albumin/Globulin Ratio: 2 (ref 1.2–2.2)
Albumin: 4.3 g/dL (ref 3.8–4.8)
Alkaline Phosphatase: 97 IU/L (ref 44–121)
BUN/Creatinine Ratio: 15 (ref 10–24)
BUN: 15 mg/dL (ref 8–27)
Bilirubin Total: 0.6 mg/dL (ref 0.0–1.2)
CO2: 25 mmol/L (ref 20–29)
Calcium: 9.1 mg/dL (ref 8.6–10.2)
Chloride: 102 mmol/L (ref 96–106)
Creatinine, Ser: 1.01 mg/dL (ref 0.76–1.27)
Globulin, Total: 2.2 g/dL (ref 1.5–4.5)
Glucose: 120 mg/dL — ABNORMAL HIGH (ref 70–99)
Potassium: 3.5 mmol/L (ref 3.5–5.2)
Sodium: 141 mmol/L (ref 134–144)
Total Protein: 6.5 g/dL (ref 6.0–8.5)
eGFR: 81 mL/min/{1.73_m2} (ref >59)

## 2021-11-03 LAB — LIPID PANEL
Chol/HDL Ratio: 3.5 ratio (ref 0.0–5.0)
Cholesterol, Total: 142 mg/dL (ref 100–199)
HDL: 41 mg/dL (ref >39)
LDL Chol Calc (NIH): 91 mg/dL (ref 0–99)
Triglycerides: 44 mg/dL (ref 0–149)
VLDL Cholesterol Cal: 10 mg/dL (ref 5–40)

## 2021-11-03 LAB — PSA: Prostate Specific Ag, Serum: 4.5 ng/mL — ABNORMAL HIGH (ref 0.0–4.0)

## 2021-11-03 LAB — TSH: TSH: 0.59 u[IU]/mL (ref 0.450–4.500)

## 2021-11-04 ENCOUNTER — Ambulatory Visit: Payer: Medicare Other

## 2021-11-07 ENCOUNTER — Ambulatory Visit (INDEPENDENT_AMBULATORY_CARE_PROVIDER_SITE_OTHER): Payer: Medicare Other | Admitting: *Deleted

## 2021-11-07 DIAGNOSIS — Z0000 Encounter for general adult medical examination without abnormal findings: Secondary | ICD-10-CM | POA: Diagnosis not present

## 2021-11-07 NOTE — Patient Instructions (Signed)
Carlos Foster , ?Thank you for taking time to come for your Medicare Wellness Visit. I appreciate your ongoing commitment to your health goals. Please review the following plan we discussed and let me know if I can assist you in the future.  ? ?Screening recommendations/referrals: ?Colonoscopy: up to date ?Recommended yearly ophthalmology/optometry visit for glaucoma screening and checkup ?Recommended yearly dental visit for hygiene and checkup ? ?Vaccinations: ?Influenza vaccine: up to date ?Pneumococcal vaccine: Education provided ?Tdap vaccine: up to date ?Shingles vaccine: Education provided   ? ?Advanced directives: on file ? ?Conditions/risks identified:  ? ?Next appointment: 11-08-2021 @ 2:30  Damita Dunnings ? ?Preventive Care 31 Years and Older, Male ?Preventive care refers to lifestyle choices and visits with your health care provider that can promote health and wellness. ?What does preventive care include? ?A yearly physical exam. This is also called an annual well check. ?Dental exams once or twice a year. ?Routine eye exams. Ask your health care provider how often you should have your eyes checked. ?Personal lifestyle choices, including: ?Daily care of your teeth and gums. ?Regular physical activity. ?Eating a healthy diet. ?Avoiding tobacco and drug use. ?Limiting alcohol use. ?Practicing safe sex. ?Taking low doses of aspirin every day. ?Taking vitamin and mineral supplements as recommended by your health care provider. ?What happens during an annual well check? ?The services and screenings done by your health care provider during your annual well check will depend on your age, overall health, lifestyle risk factors, and family history of disease. ?Counseling  ?Your health care provider may ask you questions about your: ?Alcohol use. ?Tobacco use. ?Drug use. ?Emotional well-being. ?Home and relationship well-being. ?Sexual activity. ?Eating habits. ?History of falls. ?Memory and ability to understand  (cognition). ?Work and work Statistician. ?Screening  ?You may have the following tests or measurements: ?Height, weight, and BMI. ?Blood pressure. ?Lipid and cholesterol levels. These may be checked every 5 years, or more frequently if you are over 80 years old. ?Skin check. ?Lung cancer screening. You may have this screening every year starting at age 61 if you have a 30-pack-year history of smoking and currently smoke or have quit within the past 15 years. ?Fecal occult blood test (FOBT) of the stool. You may have this test every year starting at age 51. ?Flexible sigmoidoscopy or colonoscopy. You may have a sigmoidoscopy every 5 years or a colonoscopy every 10 years starting at age 70. ?Prostate cancer screening. Recommendations will vary depending on your family history and other risks. ?Hepatitis C blood test. ?Hepatitis B blood test. ?Sexually transmitted disease (STD) testing. ?Diabetes screening. This is done by checking your blood sugar (glucose) after you have not eaten for a while (fasting). You may have this done every 1-3 years. ?Abdominal aortic aneurysm (AAA) screening. You may need this if you are a current or former smoker. ?Osteoporosis. You may be screened starting at age 1 if you are at high risk. ?Talk with your health care provider about your test results, treatment options, and if necessary, the need for more tests. ?Vaccines  ?Your health care provider may recommend certain vaccines, such as: ?Influenza vaccine. This is recommended every year. ?Tetanus, diphtheria, and acellular pertussis (Tdap, Td) vaccine. You may need a Td booster every 10 years. ?Zoster vaccine. You may need this after age 2. ?Pneumococcal 13-valent conjugate (PCV13) vaccine. One dose is recommended after age 67. ?Pneumococcal polysaccharide (PPSV23) vaccine. One dose is recommended after age 27. ?Talk to your health care provider about which screenings and vaccines  you need and how often you need them. ?This  information is not intended to replace advice given to you by your health care provider. Make sure you discuss any questions you have with your health care provider. ?Document Released: 08/13/2015 Document Revised: 04/05/2016 Document Reviewed: 05/18/2015 ?Elsevier Interactive Patient Education ? 2017 [place]. ? ?Fall Prevention in the Home ?Falls can cause injuries. They can happen to people of all ages. There are many things you can do to make your home safe and to help prevent falls. ?What can I do on the outside of my home? ?Regularly fix the edges of walkways and driveways and fix any cracks. ?Remove anything that might make you trip as you walk through a door, such as a raised step or threshold. ?Trim any bushes or trees on the path to your home. ?Use bright outdoor lighting. ?Clear any walking paths of anything that might make someone trip, such as rocks or tools. ?Regularly check to see if handrails are loose or broken. Make sure that both sides of any steps have handrails. ?Any raised decks and porches should have guardrails on the edges. ?Have any leaves, snow, or ice cleared regularly. ?Use sand or salt on walking paths during winter. ?Clean up any spills in your garage right away. This includes oil or grease spills. ?What can I do in the bathroom? ?Use night lights. ?Install grab bars by the toilet and in the tub and shower. Do not use towel bars as grab bars. ?Use non-skid mats or decals in the tub or shower. ?If you need to sit down in the shower, use a plastic, non-slip stool. ?Keep the floor dry. Clean up any water that spills on the floor as soon as it happens. ?Remove soap buildup in the tub or shower regularly. ?Attach bath mats securely with double-sided non-slip rug tape. ?Do not have throw rugs and other things on the floor that can make you trip. ?What can I do in the bedroom? ?Use night lights. ?Make sure that you have a light by your bed that is easy to reach. ?Do not use any sheets or  blankets that are too big for your bed. They should not hang down onto the floor. ?Have a firm chair that has side arms. You can use this for support while you get dressed. ?Do not have throw rugs and other things on the floor that can make you trip. ?What can I do in the kitchen? ?Clean up any spills right away. ?Avoid walking on wet floors. ?Keep items that you use a lot in easy-to-reach places. ?If you need to reach something above you, use a strong step stool that has a grab bar. ?Keep electrical cords out of the way. ?Do not use floor polish or wax that makes floors slippery. If you must use wax, use non-skid floor wax. ?Do not have throw rugs and other things on the floor that can make you trip. ?What can I do with my stairs? ?Do not leave any items on the stairs. ?Make sure that there are handrails on both sides of the stairs and use them. Fix handrails that are broken or loose. Make sure that handrails are as long as the stairways. ?Check any carpeting to make sure that it is firmly attached to the stairs. Fix any carpet that is loose or worn. ?Avoid having throw rugs at the top or bottom of the stairs. If you do have throw rugs, attach them to the floor with carpet tape. ?Make sure  that you have a light switch at the top of the stairs and the bottom of the stairs. If you do not have them, ask someone to add them for you. ?What else can I do to help prevent falls? ?Wear shoes that: ?Do not have high heels. ?Have rubber bottoms. ?Are comfortable and fit you well. ?Are closed at the toe. Do not wear sandals. ?If you use a stepladder: ?Make sure that it is fully opened. Do not climb a closed stepladder. ?Make sure that both sides of the stepladder are locked into place. ?Ask someone to hold it for you, if possible. ?Clearly mark and make sure that you can see: ?Any grab bars or handrails. ?First and last steps. ?Where the edge of each step is. ?Use tools that help you move around (mobility aids) if they are  needed. These include: ?Canes. ?Walkers. ?Scooters. ?Crutches. ?Turn on the lights when you go into a dark area. Replace any light bulbs as soon as they burn out. ?Set up your furniture so you have a clear path. Avoid moving y

## 2021-11-07 NOTE — Progress Notes (Signed)
? ?Subjective:  ? Carlos Foster is a 71 y.o. male who presents for Medicare Annual/Subsequent preventive examination. ? ?I connected with  [name] on 11/07/21 by a telephone enabled telemedicine application and verified that I am speaking with the correct person using two identifiers. ?  ?I discussed the limitations of evaluation and management by telemedicine. The patient expressed understanding and agreed to proceed. ? ?Patient location: home ? ?Provider location: Tele-Health not in office ? ? ?Review of Systems    ? ?Cardiac Risk Factors include: advanced age (>36mn, >>45women);hypertension;male gender;family history of premature cardiovascular disease ? ?   ?Objective:  ?  ?Today's Vitals  ? ?There is no height or weight on file to calculate BMI. ? ? ?  11/03/2020  ?  1:15 PM 10/16/2019  ? 12:08 PM 03/01/2017  ? 10:49 AM 11/24/2016  ?  3:09 PM 07/22/2016  ?  2:00 AM 07/21/2016  ?  8:27 PM 01/06/2016  ? 10:46 AM  ?Advanced Directives  ?Does Patient Have a Medical Advance Directive? Yes Yes Yes Yes Yes Yes Yes  ?Type of AParamedicof [place] Orchards[place] Farms[place] will [place]amb[place]Living will [place]Living will [place]villeLiving will Living will Living will   ?Does patient want to make changes to medical advance directive?     No - Patient declined    ?Copy of [place] Chart? No - copy requested No - copy requested No - copy requested No - copy requested     ? ? ?Current Medications (verified) ?Outpatient Encounter Medications as of 11/07/2021  ?Medication Sig  ? acetaminophen (TYLENOL) 500 MG tablet Take 500 mg by mouth every 6 (six) hours as needed.  ? amlodipine-atorvastatin (CADUET) 10-10 MG tablet Take 1 tablet by mouth daily.  ? aspirin EC 81 MG tablet Take 81 mg by mouth daily.  ? cyclobenzaprine (FLEXERIL) 10 MG tablet TAKE 1 TABLET BY MOUTH EVERY NIGHT AT BEDTIME AS NEEDED FOR MUSCLE SPASMS (SEDATION CAUTION)  ?  doxycycline (VIBRAMYCIN) 100 MG capsule Take 1 capsule (100 mg total) by mouth 2 (two) times daily as needed. For rosacea flare ups  ? levothyroxine (SYNTHROID) 150 MCG tablet TAKE 1 TABLET BY MOUTH ONCE A DAY BEFOREBREAKFAST  ? metroNIDAZOLE (METROGEL) 1 % gel Apply 1 application  topically daily as needed for rosacea.  ? naproxen sodium (ALEVE) 220 MG tablet Take 220 mg by mouth daily as needed.  ? Polyethyl Glycol-Propyl Glycol 0.4-0.3 % SOLN Place 1 drop into both eyes 3 (three) times daily as needed (for dry eyes/contact irritation).  ? potassium chloride SA (KLOR-CON) 20 MEQ tablet TAKE 2 TABLETS BY MOUTH TWICE A DAY  ? quinapril (ACCUPRIL) 40 MG tablet Take 1 tablet (40 mg total) by mouth at bedtime.  ? spironolactone (ALDACTONE) 25 MG tablet Take 1 tablet (25 mg total) by mouth daily.  ? Turmeric (QC TUMERIC COMPLEX PO) Take by mouth. (Patient not taking: Reported on 11/07/2021)  ? ?No facility-administered encounter medications on file as of 11/07/2021.  ? ? ?Allergies (verified) ?Gabapentin and Lyrica [pregabalin]  ? ?History: ?Past Medical History:  ?Diagnosis Date  ? Arthritis   ? feet  ? GERD (gastroesophageal reflux disease)   ? History of hiatal hernia   ? Hyperlipidemia   ? Hypertension   ? Hypokalemia   ? Hypothyroidism   ? Kidney stones   ? Rosacea   ? ?Past Surgical History:  ?Procedure Laterality Date  ? ACHILLES  TENDON SURGERY Left 06/01/2020  ? C2 C3 Laminectomy  07/09/2016  ? COLONOSCOPY    ? COLONOSCOPY  11/2019  ? ETT  04/15/03  ? wnl ST decreased < 42m V6  ? EVACUATION OF CERVICAL HEMATOMA N/A 07/21/2016  ? Procedure: RE-EXPLORATION OF ANTERIOR CERVICAL WOUND AND EVACUATION OF CERVICAL HEMATOMA;  Surgeon: GKary Kos MD;  [facility]  Service: Neurosurgery;  Laterality: N/A;  ? HAMMER TOE SURGERY Left 01/06/2016  ? Procedure: LEFT SECOND HAMMER TOE CORRECTION;  Surgeon: JWylene Simmer MD;  [place] [facility]  Service: Orthopedics;  Laterality: Left;  ? hematoma removal    ?  around larnyx  ? [place] ? HERNIA REPAIR Bilateral 2015  ? inguinal  ? LAMINECTOMY  12/02/99  ? C4/5, 5/6, 6/7 Fusion (Dr. BJoya Salm  ? LUMBAR LAMINECTOMY/DECOMPRESSION MICRODISCECTOMY Left 03/02/2017  ? Procedure: Laminectomy and Foraminotomy - L2-L3 - L3-L4 - left;  Surgeon: CKary Kos MD;  [facility]  Service: Neurosurgery;  Laterality: Left;  ? METATARSAL OSTEOTOMY Left 01/06/2016  ? Procedure: LEFT SECOND METATARSAL WEIL OSTEOTOMY;  Surgeon: JWylene Simmer MD;  [place] [facility]  Service: Orthopedics;  Laterality: Left;  ? Morton's Neuroma Repair Right 1982  ? foot  ? PROSTATE BIOPSY  12/10/06  ? Benign (Jeffie Pollock  ? ?Family History  ?Problem Relation Age of Onset  ? Kidney disease Mother   ?     Acute kidney failure  ? Heart disease Mother   ?     CAD  ? Hypertension Mother   ? Diabetes Mother   ?     DM and diabetic retinopathy, toe amputation  ? Diabetes Other   ? Heart disease Father   ? AAA (abdominal aortic aneurysm) Father   ? Colon polyps Father   ? Lymphoma Maternal Uncle   ?     X 2  ? Depression Neg Hx   ? Alcohol abuse Neg Hx   ? Drug abuse Neg Hx   ? Stroke Neg Hx   ? Prostate cancer Neg Hx   ? Colon cancer Neg Hx   ? Esophageal cancer Neg Hx   ? Stomach cancer Neg Hx   ? Rectal cancer Neg Hx   ? ?Social History  ? ?Socioeconomic History  ? Marital status: Married  ?  Spouse name: Not on file  ? Number of children: 1  ? Years of education: Not on file  ? Highest education level: Not on file  ?Occupational History  ? Occupation: IRecruitment consultantat [employer] ?  Employer: unemployed  ?Tobacco Use  ? Smoking status: Never  ? Smokeless tobacco: Never  ?Vaping Use  ? Vaping Use: Never used  ?Substance and Sexual Activity  ? Alcohol use: No  ? Drug use: No  ? Sexual activity: Not on file  ?Other Topics Concern  ? Not on file  ?Social History Narrative  ? Remarried, 1985  ? One child from his 1st marriage, 2 stepchildren  ? Education:  Degree in TCharity fundraiserfrom [employer]  ? Laid off from work 2012  ? 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 ?Social Determinants of Health  ? ?Financial Resource Strain: Low Risk   ? Difficulty of Paying Living Expenses: Not hard at all  ?Food Insecurity: No Food Insecurity  ? Worried About RCharity fundraiserin the Last Year: Never true  ? Ran Out of Food in the Last Year: Never true  ?Transportation  Needs: No Transportation Needs  ? Lack of Transportation (Medical): No  ? Lack of Transportation (Non-Medical): No  ?Physical Activity: Sufficiently Active  ? Days of Exercise per Week: 3 days  ? Minutes of Exercise per Session: 60 min  ?Stress: No Stress Concern Present  ? Feeling of Stress : Not at all  ?Social Connections: Moderately Integrated  ? Frequency of Communication with Friends and Family: More than three times a week  ? Frequency of Social Gatherings with Friends and Family: More than three times a week  ? Attends Religious Services: Never  ? Active Member of Clubs or Organizations: Yes  ? Attends Archivist Meetings: More than 4 times per year  ? Marital Status: Married  ? ? ?Tobacco Counseling ?Counseling given: Not Answered ? ? ?Clinical Intake: ? ?Pre-visit preparation completed: Yes ? ?Pain : No/denies pain ? ?  ? ?Nutritional Risks: None ? ?How often do you need to have someone help you when you read instructions, pamphlets, or other written materials from your doctor or pharmacy?: 1 - Never ? ?Diabetic?  no ? ?Interpreter Needed?: No ? ?Information entered by :: Leroy Kennedy LPN ? ? ?Activities of Daily Living ? ?  11/07/2021  ? 11:04 AM  ?In your present state of health, do you have any difficulty performing the following activities:  ?Hearing? 0  ?Vision? 0  ?Difficulty concentrating or making decisions? 0  ?Walking or climbing stairs? 0  ?Dressing or bathing? 0  ?Doing errands, shopping? 0  ?Preparing Food and eating ? N  ?Using the Toilet? N  ?In the past six months, have you accidently leaked urine? N  ?Do you have  problems with loss of bowel control? N  ?Managing your Medications? N  ?Managing your Finances? N  ? ? ?Patient Care Team: ?Tonia Ghent, MD as PCP - General ? ?Indicate any recent Medical Services you may have rece

## 2021-11-08 ENCOUNTER — Ambulatory Visit (INDEPENDENT_AMBULATORY_CARE_PROVIDER_SITE_OTHER): Payer: Medicare Other | Admitting: Family Medicine

## 2021-11-08 ENCOUNTER — Encounter: Payer: Self-pay | Admitting: Family Medicine

## 2021-11-08 VITALS — BP 150/80 | HR 92 | Temp 98.1°F | Ht 73.0 in | Wt 283.0 lb

## 2021-11-08 DIAGNOSIS — R972 Elevated prostate specific antigen [PSA]: Secondary | ICD-10-CM

## 2021-11-08 DIAGNOSIS — I779 Disorder of arteries and arterioles, unspecified: Secondary | ICD-10-CM

## 2021-11-08 DIAGNOSIS — I10 Essential (primary) hypertension: Secondary | ICD-10-CM | POA: Diagnosis not present

## 2021-11-08 DIAGNOSIS — E038 Other specified hypothyroidism: Secondary | ICD-10-CM | POA: Diagnosis not present

## 2021-11-08 DIAGNOSIS — Z0000 Encounter for general adult medical examination without abnormal findings: Secondary | ICD-10-CM

## 2021-11-08 DIAGNOSIS — E785 Hyperlipidemia, unspecified: Secondary | ICD-10-CM | POA: Diagnosis not present

## 2021-11-08 DIAGNOSIS — I77811 Abdominal aortic ectasia: Secondary | ICD-10-CM

## 2021-11-08 DIAGNOSIS — Z7189 Other specified counseling: Secondary | ICD-10-CM

## 2021-11-08 MED ORDER — DOXYCYCLINE HYCLATE 100 MG PO CAPS: 100.0000 mg | ORAL_CAPSULE | Freq: Two times a day (BID) | ORAL | 3 refills | Status: DC | PRN

## 2021-11-08 MED ORDER — AMLODIPINE-ATORVASTATIN 10-10 MG PO TABS: 1.0000 | ORAL_TABLET | Freq: Every day | ORAL | 3 refills | Status: DC

## 2021-11-08 MED ORDER — DOXAZOSIN MESYLATE 1 MG PO TABS: 1.0000 mg | ORAL_TABLET | Freq: Every day | ORAL | 3 refills | Status: DC

## 2021-11-08 MED ORDER — QUINAPRIL HCL 40 MG PO TABS: 40.0000 mg | ORAL_TABLET | Freq: Every day | ORAL | 3 refills | Status: DC

## 2021-11-08 MED ORDER — POLYETHYL GLYCOL-PROPYL GLYCOL 0.4-0.3 % OP SOLN: 1.0000 [drp] | Freq: Three times a day (TID) | OPHTHALMIC | 12 refills | Status: DC | PRN

## 2021-11-08 MED ORDER — METRONIDAZOLE 1 % EX GEL: CUTANEOUS | 3 refills | Status: DC

## 2021-11-08 MED ORDER — POTASSIUM CHLORIDE CRYS ER 20 MEQ PO TBCR: EXTENDED_RELEASE_TABLET | ORAL | 3 refills | Status: DC

## 2021-11-08 MED ORDER — TAMSULOSIN HCL 0.4 MG PO CAPS: ORAL_CAPSULE | ORAL | Status: DC

## 2021-11-08 MED ORDER — SPIRONOLACTONE 25 MG PO TABS: 25.0000 mg | ORAL_TABLET | Freq: Every day | ORAL | 3 refills | Status: DC

## 2021-11-08 NOTE — Progress Notes (Signed)
Hypertension:    ?Using medication without problems or lightheadedness: not orthostatic, compliant with meds.   ?Chest pain with exertion:no ?Edema:no ?Short of breath:no ?BP similar on home checks. D/w pt.   ? ?Hypothyroidism. No neck mass, no dysphagia.  Compliant.   ? ?Elevated Cholesterol: ?Using medications without problems:yes ?Muscle aches: no ?Diet compliance: yes ?Exercise: yes, 3x/week.   ? ?PSA similar to prev.  He had urology f/u, I route a copy of this note to urology ?Flu done 2022 per EMS ?Shingles dw pt.   ?PNA deferred for now.  He didn't feel well after prev PNA vaccine dose w/o allergic rxn and it makes sense to defer for now.  ?Tetanus 2015 ?covid vaccine 2021 ?Colonoscopy 2022 ?Advance directive- wife designated if patient were incapacitated.  ?AAA screening due, ordered.   ? ?Meds, vitals, and allergies reviewed.  ? ?PMH and SH reviewed ? ?ROS: Per HPI unless specifically indicated in ROS section  ? ?GEN: nad, alert and oriented ?HEENT: ncat ?NECK: supple w/o LA ?CV: rrr. ?PULM: ctab, no inc wob ?ABD: soft, +bs ?EXT: no edema ?SKIN: no acute rash ?

## 2021-11-08 NOTE — Patient Instructions (Addendum)
I'll update urology.   ?I'll check on getting the abdominal aorta ultrasound done.  ?If your sugar is above 120 on recheck, then let me know so we can schedule an A1c.  ?Keep working on diet and exercise.   ?Try doxazosin and stop tamsulosin.  Let me know about urination and BP after about 10 days, sooner if needed.  ?Take care.  Glad to see you. ?

## 2021-11-11 NOTE — Assessment & Plan Note (Signed)
Start doxazosin and stop tamsulosin.  He will let me know about urination and BP after about 10 days, sooner if needed.  ?

## 2021-11-11 NOTE — Assessment & Plan Note (Signed)
Stable, will notify urology.   ?

## 2021-11-11 NOTE — Assessment & Plan Note (Signed)
Continue atorvastatin, labs d/w pt.  Continue work on diet and exercise.   ?

## 2021-11-11 NOTE — Assessment & Plan Note (Signed)
Advance directive- wife designated if patient were incapacitated.  

## 2021-11-11 NOTE — Assessment & Plan Note (Signed)
F/u u/s ordered.  ?

## 2021-11-11 NOTE — Assessment & Plan Note (Signed)
PSA similar to prev.  He had urology f/u, I route a copy of this note to urology ?Flu done 2022 per EMS ?Shingles dw pt.   ?PNA deferred for now.  He didn't feel well after prev PNA vaccine dose w/o allergic rxn and it makes sense to defer for now.  ?Tetanus 2015 ?covid vaccine 2021 ?Colonoscopy 2022 ?Advance directive- wife designated if patient were incapacitated.  ?AAA screening due, ordered.   ?

## 2021-11-11 NOTE — Assessment & Plan Note (Signed)
TSH normal.  Continue levothyroxine.  D/w pt.  ?

## 2021-11-15 ENCOUNTER — Ambulatory Visit
Admission: RE | Admit: 2021-11-15 | Discharge: 2021-11-15 | Disposition: A | Discharge status: Home / Self Care | Payer: Medicare Other | Source: Ambulatory Visit | Attending: Family Medicine | Admitting: Family Medicine

## 2021-11-15 DIAGNOSIS — I77811 Abdominal aortic ectasia: Secondary | ICD-10-CM | POA: Diagnosis not present

## 2021-11-15 DIAGNOSIS — Z0389 Encounter for observation for other suspected diseases and conditions ruled out: Secondary | ICD-10-CM | POA: Diagnosis not present

## 2021-11-15 DIAGNOSIS — I779 Disorder of arteries and arterioles, unspecified: Secondary | ICD-10-CM

## 2021-11-17 ENCOUNTER — Other Ambulatory Visit: Payer: Medicare Other

## 2022-01-05 ENCOUNTER — Telehealth: Payer: Self-pay

## 2022-01-05 DIAGNOSIS — I10 Essential (primary) hypertension: Secondary | ICD-10-CM

## 2022-01-05 MED ORDER — LISINOPRIL 40 MG PO TABS: 40.0000 mg | ORAL_TABLET | Freq: Every day | ORAL | 3 refills | Status: DC

## 2022-01-05 NOTE — Telephone Encounter (Signed)
I would change to lisinopril '40mg'$  a day.  Update me if BP isn't controlled 1 week after the change, sooner if needed.  Rx sent.  Would check BMET 1-2 weeks after the change.  I put in the lab order. Please schedule when possible.  Thanks.

## 2022-01-05 NOTE — Telephone Encounter (Signed)
Called pharmacy. All strength are not available.

## 2022-01-05 NOTE — Telephone Encounter (Signed)
Received a call from the pharmacy, they do not have quinapril (ACCUPRIL) 40 MG tablet in stock. Asking for [place] to send a new prescription in.

## 2022-01-05 NOTE — Telephone Encounter (Signed)
Do they have quinapril '10mg'$  pills?  If so, please change sig to 4x'10mg'$  at night.   If they don't have that then let me know.  Thanks.

## 2022-01-05 NOTE — Telephone Encounter (Signed)
Spoke to [place] at the pharmacy, it is a national back order for 40 mg and 20 mg. Wanted to see if patient can change medication to something else

## 2022-01-05 NOTE — Addendum Note (Signed)
Addended by: Tonia Ghent on: 01/05/2022 05:08 PM   Modules accepted: Orders

## 2022-01-06 NOTE — Telephone Encounter (Signed)
lmtcb

## 2022-01-09 NOTE — Telephone Encounter (Signed)
Pt called and stated the nurse left him a vm to callback about a medication change at his pharmacy, also stated he could not remember the name of the medication either. Please advise and return call back when possible, thanks.   Callback Number: [phone]

## 2022-01-09 NOTE — Telephone Encounter (Signed)
Patient notified of medication changes. Patient could not make lab appt yet as he is still finishing up his other medication. He will call back to schedule once he starts the lisinopril.

## 2022-05-03 DIAGNOSIS — N401 Enlarged prostate with lower urinary tract symptoms: Secondary | ICD-10-CM | POA: Diagnosis not present

## 2022-05-03 DIAGNOSIS — N200 Calculus of kidney: Secondary | ICD-10-CM | POA: Diagnosis not present

## 2022-05-03 DIAGNOSIS — R3915 Urgency of urination: Secondary | ICD-10-CM | POA: Diagnosis not present

## 2022-05-05 ENCOUNTER — Telehealth: Payer: Self-pay | Admitting: Family Medicine

## 2022-05-05 MED ORDER — PREDNISONE 20 MG PO TABS: ORAL_TABLET | ORAL | 0 refills | Status: DC

## 2022-05-05 NOTE — Telephone Encounter (Signed)
Please check with patient re: back pain.  Please triage patient.

## 2022-05-05 NOTE — Telephone Encounter (Signed)
Sent. Thanks.   

## 2022-05-05 NOTE — Telephone Encounter (Signed)
I spoke with pt; pt said starting about 1 month ago but back pain worsening in the last 1 1/2 wks pt has had lower rt sided back pain; pt hs hx of kidney stones and pt saw urologist on 05/03/22 pt said urologist advised urine is clear and thinks more muscular in nature.pt has not seen blood in urine.  Pt was given ibuprofen 400 mg taking 1 -2 pills tid and that has helped pain somewhat. Pt said having no fever, no burning or pain upon urination, no frequency of urine and no abd pain. Pt is supposed to get labs on Wed of next wk at urologist office but will not see provider there. Pt said if sitting still now pt does not have pain but when moves or stands has sharp pain in rt lower back that after standing for few mins has less pain but pain does not go away completely.pt said in past when has back pain prednisone helps. Pt said he has had no problem taking prednisone. Pt has already scheduled appt with Dr Damita Dunnings on 05/08/22 at 12:30 with UC & ED precautions which pt voiced understanding. Dr Damita Dunnings will send in prednisone for pt today/. Pt said so appreciative and send to [place]. Sending note to Dr Damita Dunnings.

## 2022-05-08 ENCOUNTER — Ambulatory Visit (INDEPENDENT_AMBULATORY_CARE_PROVIDER_SITE_OTHER): Payer: Medicare Other | Admitting: Family Medicine

## 2022-05-08 ENCOUNTER — Encounter: Payer: Self-pay | Admitting: Family Medicine

## 2022-05-08 VITALS — BP 156/70 | HR 58 | Temp 98.2°F | Ht 73.0 in | Wt 293.0 lb

## 2022-05-08 DIAGNOSIS — I10 Essential (primary) hypertension: Secondary | ICD-10-CM | POA: Diagnosis not present

## 2022-05-08 DIAGNOSIS — R600 Localized edema: Secondary | ICD-10-CM | POA: Diagnosis not present

## 2022-05-08 DIAGNOSIS — M549 Dorsalgia, unspecified: Secondary | ICD-10-CM

## 2022-05-08 NOTE — Progress Notes (Unsigned)
Follow up re: back pain. On prednisone and off ibuprofen.  More pain in the last week.  He has h/o back pain at baseline but this was worse than normal.  Prev L sided back pain but now R sided.  Better with prednisone.  No radicular pain.  Pain twisting.  Gross sensation intact except for chronic L leg paresthesia at baseline.  B SLR neg.    He had lower pulse rate recently occ down to the 40s, prev h/o similar with prev cards eval.  No syncope.  Not lightheaded.  He isn't on lisinopril yet but will change soon, still on quinapril currently.  No rate limiting agents.    H/o BLE, L>R, with h/o achilles injury.  D/w pt about compression stocking rx.  20-75mHg.  He has medical indication.   Meds, vitals, and allergies reviewed.   ROS: Per HPI unless specifically indicated in ROS section

## 2022-05-08 NOTE — Patient Instructions (Addendum)
If you continue to have any pulse rates below 50, then I think it makes sense to call cardiology.  Take care.  Glad to see you. If your pressure stays above 728 systolic when off prednisone then let me know.  Recheck labs after changing to lisinopril.

## 2022-05-09 ENCOUNTER — Emergency Department (HOSPITAL_COMMUNITY): Payer: Medicare Other

## 2022-05-09 ENCOUNTER — Emergency Department (HOSPITAL_COMMUNITY)
Admission: EM | Admit: 2022-05-09 | Discharge: 2022-05-09 | Disposition: A | Discharge status: Home / Self Care | Payer: Medicare Other | Attending: Emergency Medicine | Admitting: Emergency Medicine

## 2022-05-09 ENCOUNTER — Encounter (HOSPITAL_COMMUNITY): Payer: Self-pay

## 2022-05-09 DIAGNOSIS — S0990XA Unspecified injury of head, initial encounter: Secondary | ICD-10-CM | POA: Diagnosis not present

## 2022-05-09 DIAGNOSIS — I10 Essential (primary) hypertension: Secondary | ICD-10-CM | POA: Insufficient documentation

## 2022-05-09 DIAGNOSIS — Z981 Arthrodesis status: Secondary | ICD-10-CM | POA: Diagnosis not present

## 2022-05-09 DIAGNOSIS — Y92410 Unspecified street and highway as the place of occurrence of the external cause: Secondary | ICD-10-CM | POA: Insufficient documentation

## 2022-05-09 DIAGNOSIS — Z7982 Long term (current) use of aspirin: Secondary | ICD-10-CM | POA: Diagnosis not present

## 2022-05-09 DIAGNOSIS — Z79899 Other long term (current) drug therapy: Secondary | ICD-10-CM | POA: Insufficient documentation

## 2022-05-09 DIAGNOSIS — M4802 Spinal stenosis, cervical region: Secondary | ICD-10-CM | POA: Diagnosis not present

## 2022-05-09 DIAGNOSIS — E039 Hypothyroidism, unspecified: Secondary | ICD-10-CM | POA: Insufficient documentation

## 2022-05-09 DIAGNOSIS — M549 Dorsalgia, unspecified: Secondary | ICD-10-CM | POA: Insufficient documentation

## 2022-05-09 DIAGNOSIS — M542 Cervicalgia: Secondary | ICD-10-CM | POA: Diagnosis not present

## 2022-05-09 DIAGNOSIS — S199XXA Unspecified injury of neck, initial encounter: Secondary | ICD-10-CM | POA: Diagnosis not present

## 2022-05-09 DIAGNOSIS — M2578 Osteophyte, vertebrae: Secondary | ICD-10-CM | POA: Diagnosis not present

## 2022-05-09 NOTE — Discharge Instructions (Signed)
Note the work-up today was overall reassuring.  Your imaging studies were negative for any acute abnormalities.  Recommend close follow-up with PCP in 2 to 3 days for reevaluation of your symptoms.  You can take Tylenol/ibuprofen as needed for pain.  Please do not hesitate to return to emergency department the worrisome signs and symptoms we discussed become apparent.
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
## 2022-05-09 NOTE — ED Notes (Signed)
Pt returned from CT °

## 2022-05-09 NOTE — ED Notes (Signed)
Pt transported for CT 

## 2022-05-10 NOTE — Assessment & Plan Note (Signed)
Improved on prednisone.  Continue as is with routine cautions given to patient.

## 2022-05-10 NOTE — Assessment & Plan Note (Signed)
He is going to make the change to lisinopril in the near future based on lack of quinapril availability.  If his pressure stays elevated he can let me know.  If he has persistent bradycardia that he can update cardiology.  See after visit summary.

## 2022-05-17 ENCOUNTER — Telehealth: Payer: Self-pay
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
## 2022-05-18 ENCOUNTER — Ambulatory Visit (INDEPENDENT_AMBULATORY_CARE_PROVIDER_SITE_OTHER): Payer: Medicare Other | Admitting: Family Medicine

## 2022-05-18 ENCOUNTER — Encounter: Payer: Self-pay | Admitting: Family Medicine

## 2022-05-18 DIAGNOSIS — Z981 Arthrodesis status: Secondary | ICD-10-CM | POA: Diagnosis not present

## 2022-05-18 NOTE — Patient Instructions (Signed)
Update me as needed.  Take care.  Glad to see you.   
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
## 2022-05-21 DIAGNOSIS — Z981 Arthrodesis status: Secondary | ICD-10-CM | POA: Insufficient documentation

## 2022-05-21 NOTE — Assessment & Plan Note (Signed)
Images reviewed with patient and hardware is still intact.  At this point he is doing well, given his situation.  He will update me as needed.
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
## 2022-07-06 ENCOUNTER — Ambulatory Visit: Payer: Medicare Other | Attending: Cardiology | Admitting: Cardiology

## 2022-07-06 ENCOUNTER — Encounter: Payer: Self-pay | Admitting: Cardiology

## 2022-07-06 VITALS — BP 148/68 | HR 50 | Ht 73.0 in | Wt 290.4 lb

## 2022-07-06 DIAGNOSIS — E7800 Pure hypercholesterolemia, unspecified: Secondary | ICD-10-CM | POA: Diagnosis not present

## 2022-07-06 DIAGNOSIS — I10 Essential (primary) hypertension: Secondary | ICD-10-CM | POA: Diagnosis not present

## 2022-07-06 DIAGNOSIS — R001 Bradycardia, unspecified: Secondary | ICD-10-CM | POA: Diagnosis not present

## 2022-07-06 MED ORDER — DOXAZOSIN MESYLATE 2 MG PO TABS: 2.0000 mg | ORAL_TABLET | Freq: Every day | ORAL | 3 refills | Status: DC

## 2022-07-06 NOTE — Patient Instructions (Signed)
Medication Instructions:  Increase Doxazosin to 2 mg daily Continue all other medications   *If you need a refill on your cardiac medications before your next appointment, please call your pharmacy*   Lab Work: None ordered   Testing/Procedures: None ordered   Follow-Up: At [facility], you and your health needs are our priority.  As part of our continuing mission to provide you with exceptional heart care, we have created designated Provider Care Teams.  These Care Teams include your primary Cardiologist (physician) and Advanced Practice Providers (APPs -  Physician Assistants and Nurse Practitioners) who all work together to provide you with the care you need, when you need it.  We recommend signing up for the patient portal called "MyChart".  Sign up information is provided on this After Visit Summary.  MyChart is used to connect with patients for Virtual Visits (Telemedicine).  Patients are able to view lab/test results, encounter notes, upcoming appointments, etc.  Non-urgent messages can be sent to your provider as well.   To learn more about what you can do with MyChart, go to [email or web address].    Your next appointment:  1 year   Call in Sept to schedule Dec appointment     The format for your next appointment: Office    Provider:  Dr.Jordan   Important Information About Sugar

## 2022-07-10 ENCOUNTER — Other Ambulatory Visit: Payer: Medicare Other

## 2022-10-20 ENCOUNTER — Other Ambulatory Visit: Payer: Self-pay | Admitting: Family Medicine

## 2022-10-20 DIAGNOSIS — Z125 Encounter for screening for malignant neoplasm of prostate: Secondary | ICD-10-CM

## 2022-10-20 DIAGNOSIS — I10 Essential (primary) hypertension: Secondary | ICD-10-CM

## 2022-11-01 ENCOUNTER — Other Ambulatory Visit: Payer: Self-pay | Admitting: Family Medicine

## 2022-11-03 ENCOUNTER — Other Ambulatory Visit: Payer: Self-pay | Admitting: Family Medicine

## 2022-11-06 ENCOUNTER — Other Ambulatory Visit (INDEPENDENT_AMBULATORY_CARE_PROVIDER_SITE_OTHER): Payer: Medicare Other

## 2022-11-06 DIAGNOSIS — Z125 Encounter for screening for malignant neoplasm of prostate: Secondary | ICD-10-CM | POA: Diagnosis not present

## 2022-11-06 DIAGNOSIS — I10 Essential (primary) hypertension: Secondary | ICD-10-CM

## 2022-11-06 NOTE — Addendum Note (Signed)
Addended by: Keaja Reaume J on: 11/06/2022 08:44 AM   Modules accepted: Orders  

## 2022-11-06 NOTE — Addendum Note (Signed)
Addended by: Alvina Chou on: 11/06/2022 08:44 AM   Modules accepted: Orders

## 2022-11-07 LAB — CBC WITH DIFFERENTIAL/PLATELET
Basophils Absolute: 0 10*3/uL (ref 0.0–0.2)
Basos: 0 %
EOS (ABSOLUTE): 0.1 10*3/uL (ref 0.0–0.4)
Eos: 2 %
Hematocrit: 40.2 % (ref 37.5–51.0)
Hemoglobin: 13.7 g/dL (ref 13.0–17.7)
Immature Grans (Abs): 0 10*3/uL (ref 0.0–0.1)
Immature Granulocytes: 0 %
Lymphocytes Absolute: 1.5 10*3/uL (ref 0.7–3.1)
Lymphs: 28 %
MCH: 30.9 pg (ref 26.6–33.0)
MCHC: 34.1 g/dL (ref 31.5–35.7)
MCV: 91 fL (ref 79–97)
Monocytes Absolute: 0.5 10*3/uL (ref 0.1–0.9)
Monocytes: 10 %
Neutrophils Absolute: 3.2 10*3/uL (ref 1.4–7.0)
Neutrophils: 60 %
Platelets: 171 10*3/uL (ref 150–450)
RBC: 4.44 x10E6/uL (ref 4.14–5.80)
RDW: 13.2 % (ref 11.6–15.4)
WBC: 5.4 10*3/uL (ref 3.4–10.8)

## 2022-11-07 LAB — LIPID PANEL
Chol/HDL Ratio: 2.8 ratio (ref 0.0–5.0)
Cholesterol, Total: 111 mg/dL (ref 100–199)
HDL: 39 mg/dL — ABNORMAL LOW (ref >39)
LDL Chol Calc (NIH): 57 mg/dL (ref 0–99)
Triglycerides: 72 mg/dL (ref 0–149)
VLDL Cholesterol Cal: 15 mg/dL (ref 5–40)

## 2022-11-07 LAB — COMPREHENSIVE METABOLIC PANEL
ALT: 20 IU/L (ref 0–44)
AST: 17 IU/L (ref 0–40)
Albumin/Globulin Ratio: 1.9 (ref 1.2–2.2)
Albumin: 4.3 g/dL (ref 3.9–4.9)
Alkaline Phosphatase: 89 IU/L (ref 44–121)
BUN/Creatinine Ratio: 17 (ref 10–24)
BUN: 18 mg/dL (ref 8–27)
Bilirubin Total: 0.7 mg/dL (ref 0.0–1.2)
CO2: 22 mmol/L (ref 20–29)
Calcium: 9 mg/dL (ref 8.6–10.2)
Chloride: 106 mmol/L (ref 96–106)
Creatinine, Ser: 1.06 mg/dL (ref 0.76–1.27)
Globulin, Total: 2.3 g/dL (ref 1.5–4.5)
Glucose: 107 mg/dL — ABNORMAL HIGH (ref 70–99)
Potassium: 3.7 mmol/L (ref 3.5–5.2)
Sodium: 142 mmol/L (ref 134–144)
Total Protein: 6.6 g/dL (ref 6.0–8.5)
eGFR: 76 mL/min/{1.73_m2} (ref >59)

## 2022-11-07 LAB — PSA: Prostate Specific Ag, Serum: 4.9 ng/mL — ABNORMAL HIGH (ref 0.0–4.0)

## 2022-11-07 LAB — TSH: TSH: 1.01 u[IU]/mL (ref 0.450–4.500)

## 2022-11-08 DIAGNOSIS — R972 Elevated prostate specific antigen [PSA]: Secondary | ICD-10-CM | POA: Diagnosis not present

## 2022-11-13 ENCOUNTER — Encounter: Payer: Self-pay | Admitting: Family Medicine

## 2022-11-13 ENCOUNTER — Ambulatory Visit (INDEPENDENT_AMBULATORY_CARE_PROVIDER_SITE_OTHER): Payer: Medicare Other | Admitting: Family Medicine

## 2022-11-13 VITALS — BP 134/80 | HR 50 | Temp 98.1°F | Ht 73.0 in | Wt 287.0 lb

## 2022-11-13 DIAGNOSIS — R001 Bradycardia, unspecified: Secondary | ICD-10-CM | POA: Diagnosis not present

## 2022-11-13 DIAGNOSIS — I10 Essential (primary) hypertension: Secondary | ICD-10-CM | POA: Diagnosis not present

## 2022-11-13 DIAGNOSIS — E038 Other specified hypothyroidism: Secondary | ICD-10-CM | POA: Diagnosis not present

## 2022-11-13 DIAGNOSIS — E785 Hyperlipidemia, unspecified: Secondary | ICD-10-CM

## 2022-11-13 DIAGNOSIS — Z7189 Other specified counseling: Secondary | ICD-10-CM

## 2022-11-13 DIAGNOSIS — Z0000 Encounter for general adult medical examination without abnormal findings: Secondary | ICD-10-CM | POA: Diagnosis not present

## 2022-11-13 MED ORDER — DOXYCYCLINE HYCLATE 100 MG PO CAPS: 100.0000 mg | ORAL_CAPSULE | Freq: Two times a day (BID) | ORAL | 3 refills | Status: DC | PRN

## 2022-11-13 MED ORDER — POTASSIUM CHLORIDE CRYS ER 20 MEQ PO TBCR: EXTENDED_RELEASE_TABLET | ORAL | 3 refills | Status: DC

## 2022-11-13 MED ORDER — LEVOTHYROXINE SODIUM 150 MCG PO TABS: ORAL_TABLET | ORAL | 3 refills | Status: DC

## 2022-11-13 MED ORDER — METRONIDAZOLE 1 % EX GEL: CUTANEOUS | 3 refills | Status: AC

## 2022-11-13 MED ORDER — LISINOPRIL 40 MG PO TABS: 40.0000 mg | ORAL_TABLET | Freq: Every day | ORAL | 3 refills | Status: DC

## 2022-11-13 MED ORDER — AMLODIPINE-ATORVASTATIN 10-10 MG PO TABS: 1.0000 | ORAL_TABLET | Freq: Every day | ORAL | 3 refills | Status: DC

## 2022-11-13 MED ORDER — SPIRONOLACTONE 25 MG PO TABS: 25.0000 mg | ORAL_TABLET | Freq: Every day | ORAL | 3 refills | Status: DC

## 2022-11-13 NOTE — Progress Notes (Unsigned)
I have personally reviewed the Medicare Annual Wellness questionnaire and have noted 1. The patient's medical and social history 2. Their use of alcohol, tobacco or illicit drugs 3. Their current medications and supplements 4. The patient's functional ability including ADL's, fall risks, home safety risks and hearing or visual             impairment. 5. Diet and physical activities 6. Evidence for depression or mood disorders  The patients weight, height, BMI have been recorded in the chart and visual acuity is per eye clinic.  I have made referrals, counseling and provided education to the patient based review of the above and I have provided the pt with a written personalized care plan for preventive services.  Provider list updated- see scanned forms.  Routine anticipatory guidance given to patient.  See health maintenance. The possibility exists that previously documented standard health maintenance information may have been brought forward from a previous encounter into this note.  If needed, that same information has been updated to reflect the current situation based on today's encounter.    Flu Shingles PNA Tetanus Colon  Prostate cancer screening Advance directive Cognitive function addressed- see scanned forms- and if abnormal then additional documentation follows.   In addition to [facility] Wellness, follow up visit for the below conditions:  Hypertension:    Using medication without problems or lightheadedness:  can occ get lightheaded.  No syncope.   Chest pain with exertion: yes Edema:no Short of breath:no  Recently with apparent new baseline bradycardia down to the 40-50s.  Can get up to 80 with exercise.  No rate limiting agents.  No syncope.  He stopped flexeril in the meantime.  He noted gumline irritation and pulse changes with flexeril use- resolved off med.  He would feel occ skipped/extra beat while using flexeril.  No prolonged rhythm changes o/w, ie no known AF.   Needs f/u with Dr. Swaziland.  Need to set up.   Elevated Cholesterol: Using medications without problems: yes Muscle aches: not from statin.   Diet compliance: yes Exercise: yes  He has some B trochanteric pain with walking- but no pain pushing a cart.  D/w pt about trying icing.    Hypothyroidism.  Compliant.  TSH wnl.  No ADE on med.    He has urology f/u pending.    PMH and SH reviewed  Meds, vitals, and allergies reviewed.   ROS: Per HPI.  Unless specifically indicated otherwise in HPI, the patient denies:  General: fever. Eyes: acute vision changes ENT: sore throat Cardiovascular: chest pain Respiratory: SOB GI: vomiting GU: dysuria Musculoskeletal: acute back pain Derm: acute rash Neuro: acute motor dysfunction Psych: worsening mood Endocrine: polydipsia Heme: bleeding Allergy: hayfever  GEN: nad, alert and oriented HEENT: ncat NECK: supple w/o LA CV: brady but regular.   PULM: ctab, no inc wob ABD: soft, +bs EXT: no edema SKIN: no acute rash

## 2022-11-13 NOTE — Patient Instructions (Addendum)
Please call about cardiology follow up.   Take care.  Glad to see you. Don't change your meds for now.

## 2022-11-15 DIAGNOSIS — N401 Enlarged prostate with lower urinary tract symptoms: Secondary | ICD-10-CM | POA: Diagnosis not present

## 2022-11-15 DIAGNOSIS — R972 Elevated prostate specific antigen [PSA]: Secondary | ICD-10-CM | POA: Diagnosis not present

## 2022-11-15 DIAGNOSIS — R351 Nocturia: Secondary | ICD-10-CM | POA: Diagnosis not present

## 2022-11-15 NOTE — Assessment & Plan Note (Signed)
Advance directive- wife designated if patient were incapacitated.  

## 2022-11-15 NOTE — Assessment & Plan Note (Signed)
Continue atorvastatin as is for now.  Continue work on diet and exercise.

## 2022-11-15 NOTE — Assessment & Plan Note (Addendum)
No change in medications for now.  See discussion about follow-up with cardiology regarding bradycardia.  Continue amlodipine atorvastatin doxazosin lisinopril spironolactone and potassium.  Labs discussed with patient.

## 2022-11-15 NOTE — Assessment & Plan Note (Signed)
Compliant.  TSH wnl.  No ADE on med.  Continue levothyroxine as is.

## 2022-11-15 NOTE — Assessment & Plan Note (Addendum)
Persistent bradycardia noted.  EKG reviewed today with sinus bradycardia without acute changes.  No known A-fib.  I am going to route this to Dr. Swaziland for input and follow-up.  He is not on any medications that would typically limit his rate.  At this point still okay for outpatient follow-up.
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
## 2022-11-17 ENCOUNTER — Telehealth: Payer: Self-pay

## 2022-11-17 DIAGNOSIS — R001 Bradycardia, unspecified: Secondary | ICD-10-CM

## 2022-11-17 NOTE — Telephone Encounter (Signed)
Spoke to patient Dr.Jordan advised to wear a 30 day event monitor to evaluate slow heart beat.Order placed,monitor will be mailed to your home with directions.Advised to call # on box if he had any questions.Follow up appointment scheduled with Dr.Jordan 5/24 at 3:00 pm.

## 2022-11-28 DIAGNOSIS — R001 Bradycardia, unspecified: Secondary | ICD-10-CM

## 2022-11-29 ENCOUNTER — Telehealth: Payer: Self-pay | Admitting: Cardiology

## 2022-11-29 DIAGNOSIS — R001 Bradycardia, unspecified: Secondary | ICD-10-CM | POA: Diagnosis not present

## 2022-11-29 NOTE — Telephone Encounter (Signed)
Received critical monitor report on patient- multiple different monitor reports received that state patient was in Junctional Bradycardia- Rates 27 BPM- 31 BPM at 1:41 AM, 2:02 AM, 3:14am  and 3:21 AM. Please see monitor report below- patient asymptomatic and scheduled patient to see Dr. Swaziland tomorrow per DOD. Patient is still wearing monitor and is on day 2/30. Will forward to MD for review and see if he would like for patient to continue wearing monitor and follow up late May or keep as scheduled tomorrow.      Cardiac Monitor Alert  Date of alert:  11/29/2022   Patient Name: Carlos Foster  DOB: 1950/09/18  MRN: [id number]   [facility] Cardiologist: None  [facility] EP:  None    Monitor Information: Cardiac Event Monitor [[employer]]  Reason:  Bradycardia Ordering provider:  Dr. Swaziland  { Alert Bradycardia - slowest HR: 27 BPM This is the 1st alert for this rhythm.   Next Cardiology Appointment  Date:  12/22/22   Provider:  Dr. Swaziland   Spoke with patient- patient denies any symptoms. Made patient aware of ED precautions should new or worsening symptoms develop. Patient verbalized understanding.   Arrhythmia, symptoms and history reviewed with Dr. Tresa Endo (DOD).   Plan:  Plan to follow up with patient tomorrow at OV with Dr. Sw[place]iland- patient    [name], RN  11/29/2022 12:38 PM

## 2022-11-29 NOTE — Telephone Encounter (Signed)
Called patient, advised that he could keep May appointment per provider.   Patient verbalized understanding.

## 2022-11-29 NOTE — Telephone Encounter (Signed)
Swaziland, Peter M, MD  You; Larence Penning Karl Luke, LPN2 hours ago (2:22 PM)    Thanks will go over with him on his visit  Peter Swaziland MD, [facility]   Returned call to patient and advised him of the above- patient will keep appointment for tomorrow.

## 2022-11-29 NOTE — Telephone Encounter (Signed)
Patient is returning returning call. Requesting return call.

## 2022-11-29 NOTE — Telephone Encounter (Signed)
Patient states started monitor last night and is to wear for 30 days.  His F/U appt is 5/24 and ask should he move his appt.  There are no openings until July.  Would you like to keep the May appt to review any data received at that time, or fit patient in at another time end of May or June/ Please advise

## 2022-11-29 NOTE — Telephone Encounter (Signed)
Pt is calling to speak with Elnita Maxwell regarding his heart monitor, regarding the time in came in vs. the amount of time hes had it on. He states it took longer to come in than what Elnita Maxwell initially thought.

## 2022-11-30 ENCOUNTER — Encounter: Payer: Self-pay | Admitting: Cardiology

## 2022-11-30 ENCOUNTER — Ambulatory Visit: Payer: Medicare Other | Attending: Cardiology | Admitting: Cardiology

## 2022-11-30 VITALS — BP 162/66 | HR 54 | Ht 73.0 in | Wt 283.0 lb

## 2022-11-30 DIAGNOSIS — I10 Essential (primary) hypertension: Secondary | ICD-10-CM | POA: Insufficient documentation

## 2022-11-30 DIAGNOSIS — G4733 Obstructive sleep apnea (adult) (pediatric): Secondary | ICD-10-CM | POA: Diagnosis not present

## 2022-11-30 DIAGNOSIS — R001 Bradycardia, unspecified: Secondary | ICD-10-CM | POA: Insufficient documentation

## 2022-11-30 NOTE — Progress Notes (Signed)
Cardiology Office Note   Date:  11/30/2022   ID:  Carlos Foster, DOB 25-Jan-1951, MRN [id number]  PCP:  Joaquim Nam, MD  Cardiologist:   Cordie Beazley [place], MD   Chief Complaint  Patient presents with   Bradycardia      History of Present Illness: Carlos Foster is a 72 y.o. male who is seen for follow up bradycardia. He has a history of HTN, HLD and hypothyroidism. Bicycle stress test in 2017 was normal.   He reports that he had Covid 19 in June 2022. In July he noted his HR dropped consistently into the low 40s. Previously his resting HR would be in the 80s.  He feels that he gets to a point where he has to slow down- some fatigue and SOB. No dizziness, lightheadedness or syncope. Notes no chest pain.  In November 2022 we had him wear an event monitor that showed some benign PACs and junctional rhythm rate 50. Echo was normal. No significant bradycardia.   He was seen in April by Dr Para March with noted HR 40-50s. Repeat event monitor placed. Patient had HR down to 27 while asleep.   On follow up today he is seen with his wife. He notes HR stays 44. Sometimes his wrist monitor registers in the high 30s. With exertion his maximal HR is 88. Notes some SOB but tries to work out regularly. No real dizziness. No syncope.     Past Medical History:  Diagnosis Date   Arthritis    feet   GERD (gastroesophageal reflux disease)    History of hiatal hernia    Hyperlipidemia    Hypertension    Hypokalemia    Hypothyroidism    Kidney stones    Rosacea     Past Surgical History:  Procedure Laterality Date   ACHILLES TENDON SURGERY Left 06/01/2020   C2 C3 Laminectomy  07/09/2016   COLONOSCOPY     COLONOSCOPY  11/2019   ETT  04/15/03   wnl ST decreased < 1mm V6   EVACUATION OF CERVICAL HEMATOMA N/A 07/21/2016   Procedure: RE-EXPLORATION OF ANTERIOR CERVICAL WOUND AND EVACUATION OF CERVICAL HEMATOMA;  Surgeon: Donalee Citrin, MD;  Location: [facility] OR;  Service: Neurosurgery;  Laterality: N/A;    HAMMER TOE SURGERY Left 01/06/2016   Procedure: LEFT SECOND HAMMER TOE CORRECTION;  Surgeon: Toni Arthurs, MD;  Location: [facility];  Service: Orthopedics;  Laterality: Left;   hematoma removal     around larnyx   HEMORRHOID SURGERY  1982   HERNIA REPAIR Bilateral 2015   inguinal   LAMINECTOMY  12/02/99   C4/5, 5/6, 6/7 Fusion (Dr. Jeral Fruit)   LUMBAR LAMINECTOMY/DECOMPRESSION MICRODISCECTOMY Left 03/02/2017   Procedure: Laminectomy and Foraminotomy - L2-L3 - L3-L4 - left;  Surgeon: Donalee Citrin, MD;  Location: [facility] OR;  Service: Neurosurgery;  Laterality: Left;   METATARSAL OSTEOTOMY Left 01/06/2016   Procedure: LEFT SECOND METATARSAL WEIL OSTEOTOMY;  Surgeon: Toni Arthurs, MD;  Location: [facility];  Service: Orthopedics;  Laterality: Left;   Morton's Neuroma Repair Right 1982   foot   PROSTATE BIOPSY  12/10/06   Benign Annabell Howells)     Current Outpatient Medications  Medication Sig Dispense Refill   acetaminophen (TYLENOL) 500 MG tablet Take 500 mg by mouth every 6 (six) hours as needed.     amlodipine-atorvastatin (CADUET) 10-10 MG tablet Take 1 tablet by mouth daily. 90 tablet 3   aspirin EC 81 MG tablet Take 81 mg  by mouth daily.     doxazosin (CARDURA) 2 MG tablet Take 1 tablet (2 mg total) by mouth daily. 90 tablet 3   doxycycline (VIBRAMYCIN) 100 MG capsule Take 1 capsule (100 mg total) by mouth 2 (two) times daily as needed. For rosacea flare ups 60 capsule 3   levothyroxine (SYNTHROID) 150 MCG tablet TAKE 1 TABLET BY MOUTH ONCE A DAY BEFOREBREAKFAST 90 tablet 3   lisinopril (ZESTRIL) 40 MG tablet Take 1 tablet (40 mg total) by mouth daily. 90 tablet 3   metroNIDAZOLE (METROGEL) 1 % gel Apply 1 application  topically daily as needed for rosacea. 60 g 3   naproxen sodium (ALEVE) 220 MG tablet Take 220 mg by mouth daily as needed.     Polyethyl Glycol-Propyl Glycol 0.4-0.3 % SOLN Place 1 drop into both eyes 3 (three) times daily as needed (for dry eyes/contact  irritation). 5 mL 12   potassium chloride SA (KLOR-CON M) 20 MEQ tablet TAKE 2 TABLETS BY MOUTH TWICE A DAY 360 tablet 3   spironolactone (ALDACTONE) 25 MG tablet Take 1 tablet (25 mg total) by mouth daily. 90 tablet 3   Turmeric (QC TUMERIC COMPLEX PO) Take by mouth.     No current facility-administered medications for this visit.    Allergies:   Flexeril [cyclobenzaprine], Gabapentin, and Lyrica [pregabalin]    Social History:  The patient  reports that he has never smoked. He has never used smokeless tobacco. He reports that he does not drink alcohol and does not use drugs.   Family History:  The patient's family history includes AAA (abdominal aortic aneurysm) in his father; Colon polyps in his father; Diabetes in his mother and another family member; Heart disease in his father and mother; Hypertension in his mother; Kidney disease in his mother; Lymphoma in his maternal uncle.    ROS:  Please see the history of present illness.   Otherwise, review of systems are positive for none.   All other systems are reviewed and negative.    PHYSICAL EXAM: VS:  BP (!) 162/66 (BP Location: Left Arm, Patient Position: Sitting, Cuff Size: Normal)   Pulse (!) 54   Ht 6\' 1"  (1.854 m)   Wt 283 lb (128.4 kg)   BMI 37.34 kg/m  , BMI Body mass index is 37.34 kg/m. GEN: Well nourished, overweight, in no acute distress HEENT: normal Neck: no JVD, carotid bruits, or masses Cardiac: RRR; no murmurs, rubs, or gallops,no edema  Respiratory:  clear to auscultation bilaterally, normal work of breathing GI: soft, nontender, nondistended, + BS MS: no deformity or atrophy Skin: warm and dry, no rash Neuro:  Strength and sensation are intact Psych: euthymic mood, full affect   EKG:  EKG is ordered today. NSR rate 54 with PVCs. Nonspecific ST abnormality. I have personally reviewed and interpreted this study.  Ecg dated 11/13/22 shows sinus brady with rate 47 bpm and occ PVCs. I have personally reviewed  and interpreted this study.     Recent Labs: 11/06/2022: ALT 20; BUN 18; Creatinine, Ser 1.06; Hemoglobin 13.7; Platelets 171; Potassium 3.7; Sodium 142; TSH 1.010    Lipid Panel    Component Value Date/Time   CHOL 111 11/06/2022 0847   CHOL 145 05/12/2016 0000   TRIG 72 11/06/2022 0847   TRIG 101 05/12/2016 0000   HDL 39 (L) 11/06/2022 0847   HDL 39 05/12/2016 0000   CHOLHDL 2.8 11/06/2022 0847   CHOLHDL 3 11/02/2020 0851   VLDL 26.2 11/02/2020 0851  LDLCALC 57 11/06/2022 0847   LDLCALC 86 05/12/2016 0000      Wt Readings from Last 3 Encounters:  11/30/22 283 lb (128.4 kg)  11/13/22 287 lb (130.2 kg)  07/06/22 290 lb 6.4 oz (131.7 kg)    Sleep Apnea Evaluation  [facility]  Today's Date: 11/30/2022   Patient Name: Carlos Foster        DOB: July 16, 1951       Height:  6\' 1"  (1.854 m)     Weight: 283 lb (128.4 kg)  BMI: Body mass index is 37.34 kg/m.     STOP-BANG RISK ASSESSMENT       11/30/2022    4:01 PM  STOP-BANG  Do you snore loudly? Yes  Do you often feel tired, fatigued, or sleepy during the daytime? Yes  Has anyone observed you stop breathing during sleep? Yes  Do you have (or are you being treated for) high blood pressure? Yes  Recent BMI (Calculated) 37.35  Is BMI greater than 35 kg/m2? 1=Yes  Age older than 70 years old? 1=Yes  Has large neck size > 40 cm (15.7 in, large male shirt size, large male collar size > 16) Yes  Gender - Male 1=Yes  STOP-Bang Total Score 8      If STOP-BANG Score ?3 OR two clinical symptoms - patient qualifies for WatchPAT (CPT 95800)      Sleep study ordered due to two (2) of the following clinical symptoms/diagnoses:  Excessive daytime sleepiness G47.10  Gastroesophageal reflux K21.9  Nocturia R35.1  Morning Headaches G44.221  Difficulty concentrating R41.840  Memory problems or poor judgment G31.84  Personality changes or irritability R45.4  Loud snoring R06.83  Depression F32.9   Unrefreshed by sleep G47.8  Impotence N52.9  History of high blood pressure R03.0  Insomnia G47.00  Sleep Disordered Breathing or Sleep Apnea ICD G47.33      Other studies Reviewed: Additional studies/ records that were reviewed today include:   Event monitor 06/27/21: Study Highlights    Normal sinus rhythm rate 33-132. mean 64 bpm Occasional PACs with few brief runs of PACs. Junctional rhythm with rate in the 50s. Rare PVCs     Patch Wear Time:  3 days and 0 hours (2022-11-14T21:17:39-0500 to 2022-11-17T22:14:16-0500)   Patient had a min HR of 33 bpm, max HR of 132 bpm, and avg HR of 64 bpm. Predominant underlying rhythm was Sinus Rhythm. 15 Supraventricular Tachycardia runs occurred, the run with the fastest interval lasting 4 beats with a max rate of 132 bpm, the  longest lasting 11 beats with an avg rate of 102 bpm. Junctional Rhythm was present. Junctional Rhythm was detected within +/- 45 seconds of symptomatic patient event(s). Isolated SVEs were occasional (3.9%, 10720), SVE Couplets were occasional (1.6%,  2209), and SVE Triplets were rare (<1.0%, 790). Isolated VEs were rare (<1.0%, 420), VE Couplets were rare (<1.0%, 4), and VE Triplets were rare (<1.0%, 1). Ventricular Bigeminy and Trigeminy were present.     Echo 07/05/21: IMPRESSIONS     1. Left ventricular ejection fraction, by estimation, is 60 to 65%. The  left ventricle has normal function. The left ventricle has no regional  wall motion abnormalities. There is mild left ventricular hypertrophy.  Left ventricular diastolic parameters  are consistent with Grade I diastolic dysfunction (impaired relaxation).  The average left ventricular global longitudinal strain is -22.8 %. The  global longitudinal strain is normal.   2. Right ventricular systolic function is normal. The  right ventricular  size is normal.   3. The mitral valve is normal in structure. Trivial mitral valve  regurgitation. No evidence of mitral  stenosis.   4. The aortic valve is tricuspid. Aortic valve regurgitation is not  visualized. No aortic stenosis is present.   5. Aortic dilatation noted. There is borderline dilatation of the aortic  root, measuring 39 mm.   6. The inferior vena cava is normal in size with greater than 50%  respiratory variability, suggesting right atrial pressure of 3 mmHg.   Comparison(s): No prior Echocardiogram.    ASSESSMENT AND PLAN:  1.  Bradycardia. Sinus node dysfunction  with competing junctional rhythm. He is on no rate slowing medication and labs are OK. Echo before showed normal  LV function. I suspect he may have significant OSA which may be contributing to his nocturnal bradycardia. Stop Bang score of 8. Will arrange Itmar home sleep study. Also recommend EP evaluation to see if they think pacemaker indicated.  2 .HTN-  On amlodipine 10 mg, lisinopril 40 mg and aldactone 25 mg daily. Also on Cardura 2 mg qhs.  3. HLD. On statin 4. Hypothyroidism. TSH normal on replacement.   Current medicines are reviewed at length with the patient today.dThe patient does not have concerns regarding medicines.  The following changes have been made:  no change  Labs/ tests ordered today include:   Orders Placed This Encounter  Procedures   Ambulatory referral to Cardiac Electrophysiology   EKG 12-Lead   Itamar Sleep Study    Signed, Seibert Keeter [place], MD  11/30/2022 4:52 PM    [facility] [address], [place], [place], [address] Phone [phone], Fax [phone]

## 2022-11-30 NOTE — Patient Instructions (Signed)
Medication Instructions:  No Changes *If you need a refill on your cardiac medications before your next appointment, please call your pharmacy*   Lab Work: No Labs If you have labs (blood work) drawn today and your tests are completely normal, you will receive your results only by: MyChart Message (if you have MyChart) OR A paper copy in the mail If you have any lab test that is abnormal or we need to change your treatment, we will call you to review the results.   Testing/Procedures: WatchPAT?  Is a FDA cleared portable home sleep study test that uses a watch and 3 points of contact to monitor 7 different channels, including your heart rate, oxygen saturations, body position, snoring, and chest motion.  The study is easy to use from the comfort of your own home and accurately detect sleep apnea.  Before bed, you attach the chest sensor, attached the sleep apnea bracelet to your nondominant hand, and attach the finger probe.  After the study, the raw data is downloaded from the watch and scored for apnea events.   For more information: [email or web address]  Patient Testing Instructions:  Do not put battery into the device until bedtime when you are ready to begin the test. Please call the support number if you need assistance after following the instructions below: 24 hour support line- [phone] or ITAMAR support at [phone] (option 2)  Download the IntelWatchPAT One" app through the google play store or App Store  Be sure to turn on or enable access to bluetooth in settlings on your smartphone/ device  Make sure no other bluetooth devices are on and within the vicinity of your smartphone/ device and WatchPAT watch during testing.  Make sure to leave your smart phone/ device plugged in and charging all night.  When ready for bed:  Follow the instructions step by step in the WatchPAT One App to activate the testing device. For additional instructions, including  video instruction, visit the WatchPAT One video on Youtube. You can search for WatchPat One within Youtube (video is 4 minutes and 18 seconds) or enter: https://youtube/watch?v=BCce_vbiwxE Please note: You will be prompted to enter a Pin to connect via bluetooth when starting the test. The PIN will be assigned to you when you receive the test.  The device is disposable, but it recommended that you retain the device until you receive a call letting you know the study has been received and the results have been interpreted.  We will let you know if the study did not transmit to [place] properly after the test is completed. You do not need to call us to confirm the receipt of the test.  Please complete the test within 48 hours of receiving PIN.   Frequently Asked Questions:  What is Watch Dennie Bible one?  A single use fully disposable home sleep apnea testing device and will not need to be returned after completion.  What are the requirements to use WatchPAT one?  The be able to have a successful watchpat one sleep study, you should have your Watch pat one device, your smart phone, watch pat one app, your PIN number and Internet access What type of phone do I need?  You should have a smart phone that uses Android 5.1 and above or any Iphone with IOS 10 and above How can I download the WatchPAT one app?  Based on your device type search for WatchPAT one app either in google play for android devices or APP store for Iphone's  Where will I get my PIN for the study?  Your PIN will be provided by your physician's office. It is used for authentication and if you lose/forget your PIN, please reach out to your providers office.  I do not have Internet at home. Can I do WatchPAT one study?  WatchPAT One needs Internet connection throughout the night to be able to transmit the sleep data. You can use your home/local internet or your cellular's data package. However, it is always recommended to use home/local Internet. It is  estimated that between 20MB-30MB will be used with each study.However, the application will be looking for space in the phone to start the study.  What happens if I lose internet or bluetooth connection?  During the internet disconnection, your phone will not be able to transmit the sleep data. All the data, will be stored in your phone. As soon as the internet connection is back on, the phone will being sending the sleep data. During the bluetooth disconnection, WatchPAT one will not be able to to send the sleep data to your phone. Data will be kept in the [facility] one until two devices have bluetooth connection back on. As soon as the connection is back on, WatchPAT one will send the sleep data to the phone.  How long do I need to wear the WatchPAT one?  After you start the study, you should wear the device at least 6 hours.  How far should I keep my phone from the device?  During the night, your phone should be within 15 feet.  What happens if I leave the room for restroom or other reasons?  Leaving the room for any reason will not cause any problem. As soon as your get back to the room, both devices will reconnect and will continue to send the sleep data. Can I use my phone during the sleep study?  Yes, you can use your phone as usual during the study. But it is recommended to put your watchpat one on when you are ready to go to bed.  How will I get my study results?  A soon as you completed your study, your sleep data will be sent to the provider. They will then share the results with you when they are ready.      Follow-Up: At [facility], you and your health needs are our priority.  As part of our continuing mission to provide you with exceptional heart care, we have created designated Provider Care Teams.  These Care Teams include your primary Cardiologist (physician) and Advanced Practice Providers (APPs -  Physician Assistants and Nurse Practitioners) who all work together  to provide you with the care you need, when you need it.  We recommend signing up for the patient portal called "MyChart".  Sign up information is provided on this After Visit Summary.  MyChart is used to connect with patients for Virtual Visits (Telemedicine).  Patients are able to view lab/test results, encounter notes, upcoming appointments, etc.  Non-urgent messages can be sent to your provider as well.   To learn more about what you can do with MyChart, go to [email or web address].    Your next appointment:   6 month(s)  Provider:   Peter [place], MD

## 2022-12-02 ENCOUNTER — Telehealth: Payer: Self-pay | Admitting: Internal Medicine

## 2022-12-02 NOTE — Telephone Encounter (Signed)
Received call from [employer]. Patient has been having nightly sinus bradycardia with competing junctional bradycardia ~30bpm. Last event at 4:06am 12/02/2022.  No further intervention indicated at this time. Patient to follow up with outpatient Cardiology/EP team.  Deitra Mayo, MD MPH [facility] Cardiology

## 2022-12-05 ENCOUNTER — Telehealth: Payer: Self-pay | Admitting: *Deleted

## 2022-12-05 NOTE — Telephone Encounter (Signed)
   Cardiac Monitor Alert  Date of alert:  12/05/2022   Patient Name: Carlos Foster  DOB: 14-Oct-1950  MRN: [id number]   [facility] Cardiologist: Dr Peter [place]  [facility] EP:  None    Monitor Information: Cardiac Event Monitor [[employer]]  Reason:   bradycardia Ordering provider:  Dr Peter [place]   Alert Bradycardia - slowest HR: 20-28 -3029 Junctional Bradycardia This is the 2nd, 3rd, 4th, 5th, and 6  alert for this rhythm.    Telemetry  strips --- 1:52 am ,2:28 am,4:07 am, 4:25 am,5:15 am,   Next Cardiology Appointment   Date:  12/20/22  Provider:  Dr Sharrell Ku  The patient could NOT be reached by telephone today.  Left mesasge to call back  Arrhythmia, symptoms and history reviewed with Dr Swaz[place]and.   Plan:   per Dr [place] patient is to continue wearing monitor and keep appointment with EP ( Dr Ladona Ridgel  12/20/22)    Other:   Tobin Chad, RN  12/05/2022 10:55 AM

## 2022-12-05 NOTE — Telephone Encounter (Signed)
Left message for patient to cal back.

## 2022-12-06 ENCOUNTER — Telehealth: Payer: Self-pay | Admitting: Cardiology

## 2022-12-06 NOTE — Telephone Encounter (Signed)
Patient is returning call. Requesting return call.  

## 2022-12-06 NOTE — Telephone Encounter (Signed)
Received outpt call from [facility] [employer] regarding monitor alert. Auto detected 3.9 sec pause with underlying junctional rhythm at 5:14 CST. In review of prior phone note, planned for upcoming EP appt. Given this was an auto trigger, asymptomatic, will continue current plan.

## 2022-12-06 NOTE — Telephone Encounter (Signed)
Spoke with patient regarding monitor. Alerts received 5/7 and 5/8. He is stable, no symptoms, unaware of slow HR as reported, as he is asleep. He knows he has a slow HR, which is why he is wearing monitor.   Will send over to primary cardiologist as Lorain Childes, as another alert came in today -- addressed by L. Su Hilt NP

## 2022-12-07 ENCOUNTER — Telehealth: Payer: Self-pay | Admitting: Cardiology

## 2022-12-07 NOTE — Telephone Encounter (Signed)
   Received a page from [employer] regarding "critical EKG result." Patient's device auto-alerted for junctional bradycardia this morning at a rate of 29bpm this morning at 6:30am EST. Duration 15 seconds. Given that this was not a patient triggered event, continue to monitor. EP appointment on 5/22.  Perlie Gold, PA-C

## 2022-12-08 ENCOUNTER — Telehealth: Payer: Self-pay | Admitting: Cardiology

## 2022-12-08 ENCOUNTER — Encounter: Payer: Self-pay | Admitting: Cardiology

## 2022-12-08 ENCOUNTER — Telehealth: Payer: Self-pay | Admitting: Student

## 2022-12-08 NOTE — Telephone Encounter (Signed)
Megan Salon called stating he has critical EKG Results.

## 2022-12-08 NOTE — Telephone Encounter (Addendum)
Spoke with Megan Salon and pt has had another critical episode  sinus brady of 40  with junctional bradycardia  for 10 seconds at 29 then converts to sinus brady of 40 . Pt has EP appt on  12/20/22 and pt is asymptomatic see previous phone note ./cy

## 2022-12-08 NOTE — Telephone Encounter (Signed)
    Received a page from [employer] regarding "critical EKG result." Patient's device auto-alerted for bradycardia this morning at a rate of 29-30bpm this morning at 6:37am EST. Duration 1 min. I called and spoke with patient this morning who denies symptoms of lightheadedness/dizziness. Continue with plan for EP appointment on 5/22.   Perlie Gold, PA-C

## 2022-12-08 NOTE — Telephone Encounter (Signed)
     Received a page from [employer] regarding "critical EKG result." Patient's device auto-alerted for pause of 4.4 sec in the sitting of sinus bradycardia at 4:14am EST. This was not a patient triggered event, likely occurring with sleep. Continue to monitor. EP appointment on 5/22.  Otilio Carpen. Hetty Ely, MD

## 2022-12-11 ENCOUNTER — Telehealth: Payer: Self-pay

## 2022-12-11 NOTE — Telephone Encounter (Signed)
Spoke with patient regarding monitor alerts for Bradycardia 5/11 and 5/13.  Also had a pause of 3.7 seconds on 5/13 at 4:50am, which has happened previously. He is stable, no symptoms, unaware of slow HR as reported, as he is asleep. He knows he has a slow HR, which is why he is wearing monitor. Appt 5/22.

## 2022-12-14 ENCOUNTER — Telehealth: Payer: Self-pay

## 2022-12-14 NOTE — Telephone Encounter (Signed)
   Cardiac Monitor Alert  Date of alert:  12/14/2022   Patient Name: Carlos Foster  DOB: 28-Dec-1950  MRN: [id number]   [facility] Cardiologist: Dr.Jordan  [facility] EP:  Dr.Taylor    Monitor Information: Cardiac Event Monitor [[employer]]  Reason:  bradycardia Ordering provider:  Dr.Jordan   Alert Bradycardia - slowest HR: 29 This is the 5th alert for this rhythm.   Next Cardiology Appointment   Date:  12/20/22  Provider:  Dr. Ladona Ridgel  The patient was contacted today.  He is asymptomatic. Arrhythmia, symptoms and history reviewed with Dr.Hilty.   Plan:  Keep EP appointment with Dr.Taylor    Other: Patient was asleep during all 5 alerts. He is asymptomatic and currently at work. This is recurrent alerts.  Finis Hendricksen R Maryclare Bean, LPN  11/06/8117 1:47 AM

## 2022-12-15 ENCOUNTER — Telehealth: Payer: Self-pay

## 2022-12-15 NOTE — Telephone Encounter (Signed)
   Cardiac Monitor Alert  Date of alert:  12/15/2022   Patient Name: Carlos Foster  DOB: 1951-06-03  MRN: [id number]   [facility] Cardiologist: Dr. [place]  [facility] EP: Dr.Taylor   Monitor Information: Cardiac Event Monitor [[employer]]  Reason:  Bradycardia  Ordering provider:  Dr.Jordan   Alert Bradycardia - slowest HR: 29 This is the  5  alert for this rhythm.   Next Cardiology Appointment   Date:  12/20/22  Provider:  Dr.Taylor  The patient was contacted today.  He is asymptomatic. Arrhythmia, symptoms and history reviewed with Dr.Harding.   Plan:  Keep EP appointment with Dr.Taylor   Other: Patient contacted and he is asymptomatic and as asleep during alerts.   Aleiah Mohammed [name], LPN  11/06/8117 1:47 AM

## 2022-12-17 ENCOUNTER — Telehealth: Payer: Self-pay | Admitting: Student

## 2022-12-17 NOTE — Telephone Encounter (Signed)
   Received page from [facility] about an abnormal EKG. Called and spoke with rep. Around 10:10am central time (11:10am EST) this morning, patient went into a junction bradycardia with rate of 30 bpm and then a sinus bradycardia with rates of 29 bpm. This was an automatic alert. Patient did not trigger having any symptoms. He has had multiple alerts for the same things per chart review and looks like patient has always been asymptomatic. He has an appointment to see EP (Dr Ladona Ridgel) later this week on 12/20/2022. Attempted to call patient twice but was unable to reach him. Called and left detailed voicemail (okay per DPR) and asked patient to call us back if he is having any symptoms. Otherwise, no change in plan and should keep follow-up with Dr. Ladona Ridgel later this week.  Corrin Parker, PA-C 12/17/2022 11:39 AM

## 2022-12-19 ENCOUNTER — Telehealth: Payer: Self-pay

## 2022-12-19 NOTE — Telephone Encounter (Signed)
   Cardiac Monitor Alert  Date of alert:  12/19/2022   Patient Name: Carlos Foster  DOB: [birth year]  MRN: [id number]   [facility] Cardiologist:Dr. Swaziland  [facility] EP: Dr. Ladona Ridgel   Monitor Information: Cardiac Event Monitor [[employer]]  Reason:  bradycardia  Ordering provider:  Dr. Swaziland   Alert Bradycardia - slowest HR: 20 This is the  Multiple monitor alerts daily this is day 22 he will see EP tomorrow  alert for this rhythm.   Next Cardiology Appointment   Date:  12/20/2022  Provider:  Dr. Ladona Ridgel  The patient was contacted today.  He is asymptomatic. Arrhythmia, symptoms and history reviewed with Dr. Herbie Baltimore.   Plan:  See EP tomorrow   Other: Patient was asleep during alerts. He states he feels fine and currently at work. He will keep his appointment with Dr. Trina Ao, LPN  11/06/8117 1:47 AM

## 2022-12-20 ENCOUNTER — Encounter: Payer: Self-pay | Admitting: Internal Medicine

## 2022-12-20 ENCOUNTER — Ambulatory Visit: Payer: Medicare Other | Attending: Internal Medicine | Admitting: Internal Medicine

## 2022-12-20 ENCOUNTER — Telehealth: Payer: Self-pay

## 2022-12-20 VITALS — BP 160/70 | HR 53 | Ht 73.0 in | Wt 286.0 lb

## 2022-12-20 DIAGNOSIS — R001 Bradycardia, unspecified: Secondary | ICD-10-CM | POA: Diagnosis not present

## 2022-12-20 NOTE — Telephone Encounter (Signed)
Patient is asked to monitor BP at home or work, several times per month and return with written values at next office visit.   Cardiac Monitor Alert  Date of alert:  12/20/2022   Patient Name: Carlos Foster  DOB: 10-20-1950  MRN: [id number]   [facility] Cardiologist:Dr.Jordan  [facility] EP:  Dr. Ladona Ridgel   Monitor Information: Cardiac Event Monitor [Pre[employer]ice]  Reason:  Bradycardia Ordering provider:  Dr.Jordan   Alert Bradycardia - slowest HR: 30 This is the  day 23 multiple  alert for this rhythm.   Next Cardiology Appointment   Date:  12/20/22  Provider:  Dr. Ladona Ridgel  The patient was contacted today.  He is asymptomatic. Arrhythmia, symptoms and history reviewed with Dr.Oneal.   Plan:  Patient has appointment at 10:30 am today with Dr.Taylor   Other: See EP provider today  [name] [name], LPN  11/06/8117 1:47 AM

## 2022-12-20 NOTE — Patient Instructions (Signed)
Medication Instructions:  Your physician recommends that you continue on your current medications as directed. Please refer to the Current Medication list given to you today.  *If you need a refill on your cardiac medications before your next appointment, please call your pharmacy*   Lab Work: None   If you have labs (blood work) drawn today and your tests are completely normal, you will receive your results only by: MyChart Message (if you have MyChart) OR A paper copy in the mail If you have any lab test that is abnormal or we need to change your treatment, we will call you to review the results.   Testing/Procedures: None    Follow-Up: At [facility], you and your health needs are our priority.  As part of our continuing mission to provide you with exceptional heart care, we have created designated Provider Care Teams.  These Care Teams include your primary Cardiologist (physician) and Advanced Practice Providers (APPs -  Physician Assistants and Nurse Practitioners) who all work together to provide you with the care you need, when you need it.  We recommend signing up for the patient portal called "MyChart".  Sign up information is provided on this After Visit Summary.  MyChart is used to connect with patients for Virtual Visits (Telemedicine).  Patients are able to view lab/test results, encounter notes, upcoming appointments, etc.  Non-urgent messages can be sent to your provider as well.   To learn more about what you can do with MyChart, go to [email or web address].    Your next appointment:    Pending Monitor Results   Provider:   Lewayne Bunting, MD    Other Instructions Thank you for choosing [facility]!

## 2022-12-20 NOTE — Progress Notes (Signed)
HPI [name] is referred by Dr. Swaziland for evaluation of sinus bradycardia. He is a pleasant 72 yo man with a h/o HTN, who has been bothered by fatigue. He has dyslipidemia and hypothyroidism. He has a h/o junctional rhythm over a year ago but no long pauses. He developed bradycardia initially after Covid. He has not had syncope. I have some strips from his heart monitor which demonstrates nocturnal bradycardia in the 30's.  Allergies  Allergen Reactions   Flexeril [Cyclobenzaprine]     Gumline irritation/pulse irregularity   Gabapentin Other (See Comments)    Leg swelling   Lyrica [Pregabalin]     Leg swelling     Current Outpatient Medications  Medication Sig Dispense Refill   acetaminophen (TYLENOL) 500 MG tablet Take 500 mg by mouth every 6 (six) hours as needed.     amlodipine-atorvastatin (CADUET) 10-10 MG tablet Take 1 tablet by mouth daily. 90 tablet 3   aspirin EC 81 MG tablet Take 81 mg by mouth daily.     doxazosin (CARDURA) 2 MG tablet Take 1 tablet (2 mg total) by mouth daily. 90 tablet 3   doxycycline (VIBRAMYCIN) 100 MG capsule Take 1 capsule (100 mg total) by mouth 2 (two) times daily as needed. For rosacea flare ups 60 capsule 3   levothyroxine (SYNTHROID) 150 MCG tablet TAKE 1 TABLET BY MOUTH ONCE A DAY BEFOREBREAKFAST 90 tablet 3   lisinopril (ZESTRIL) 40 MG tablet Take 1 tablet (40 mg total) by mouth daily. 90 tablet 3   metroNIDAZOLE (METROGEL) 1 % gel Apply 1 application  topically daily as needed for rosacea. 60 g 3   naproxen sodium (ALEVE) 220 MG tablet Take 220 mg by mouth daily as needed.     Polyethyl Glycol-Propyl Glycol 0.4-0.3 % SOLN Place 1 drop into both eyes 3 (three) times daily as needed (for dry eyes/contact irritation). 5 mL 12   potassium chloride SA (KLOR-CON M) 20 MEQ tablet TAKE 2 TABLETS BY MOUTH TWICE A DAY 360 tablet 3   spironolactone (ALDACTONE) 25 MG tablet Take 1 tablet (25 mg total) by mouth daily. 90 tablet 3   Turmeric (QC  TUMERIC COMPLEX PO) Take by mouth.     No current facility-administered medications for this visit.     Past Medical History:  Diagnosis Date   Arthritis    feet   GERD (gastroesophageal reflux disease)    History of hiatal hernia    Hyperlipidemia    Hypertension    Hypokalemia    Hypothyroidism    Kidney stones    Rosacea     ROS:   All systems reviewed and negative except as noted in the HPI.   Past Surgical History:  Procedure Laterality Date   ACHILLES TENDON SURGERY Left 06/01/2020   C2 C3 Laminectomy  07/09/2016   COLONOSCOPY     COLONOSCOPY  11/2019   ETT  04/15/03   wnl ST decreased < 1mm V6   EVACUATION OF CERVICAL HEMATOMA N/A 07/21/2016   Procedure: RE-EXPLORATION OF ANTERIOR CERVICAL WOUND AND EVACUATION OF CERVICAL HEMATOMA;  Surgeon: Donalee Citrin, MD;  Location: [facility] OR;  Service: Neurosurgery;  Laterality: N/A;   HAMMER TOE SURGERY Left 01/06/2016   Procedure: LEFT SECOND HAMMER TOE CORRECTION;  Surgeon: Toni Arthurs, MD;  Location: [facility];  Service: Orthopedics;  Laterality: Left;   hematoma removal     around larnyx   HEMORRHOID SURGERY  1982   HERNIA REPAIR Bilateral 2015  inguinal   LAMINECTOMY  12/02/99   C4/5, 5/6, 6/7 Fusion (Dr. Jeral Fruit)   LUMBAR LAMINECTOMY/DECOMPRESSION MICRODISCECTOMY Left 03/02/2017   Procedure: Laminectomy and Foraminotomy - L2-L3 - L3-L4 - left;  Surgeon: Donalee Citrin, MD;  Location: [facility] OR;  Service: Neurosurgery;  Laterality: Left;   METATARSAL OSTEOTOMY Left 01/06/2016   Procedure: LEFT SECOND METATARSAL WEIL OSTEOTOMY;  Surgeon: Toni Arthurs, MD;  Location: [facility];  Service: Orthopedics;  Laterality: Left;   Morton's Neuroma Repair Right 1982   foot   PROSTATE BIOPSY  12/10/06   Benign Annabell Howells)     Family History  Problem Relation Age of Onset   Kidney disease Mother        Acute kidney failure   Heart disease Mother        CAD   Hypertension Mother    Diabetes Mother        DM and  diabetic retinopathy, toe amputation   Diabetes Other    Heart disease Father    AAA (abdominal aortic aneurysm) Father    Colon polyps Father    Lymphoma Maternal Uncle        X 2   Depression Neg Hx    Alcohol abuse Neg Hx    Drug abuse Neg Hx    Stroke Neg Hx    Prostate cancer Neg Hx    Colon cancer Neg Hx    Esophageal cancer Neg Hx    Stomach cancer Neg Hx    Rectal cancer Neg Hx      Social History   Socioeconomic History   Marital status: Married    Spouse name: Not on file   Number of children: 1   Years of education: Not on file   Highest education level: Not on file  Occupational History   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   Laid off from work 2012   Engineer, structural for [employer]   Social Determinants of [employer]: Low Risk  (11/07/2021)   Overall Financial Resource Strain (CARDIA)    Difficulty of Paying Living Expenses: Not hard at all  Food Insecurity: No Food Insecurity (11/07/2021)   Hunger Vital Sign    Worried About Running Out of Food in the Last Year: Never true    Ran Out of Food in the Last Year: Never true  Transportation Needs: No Transportation Needs (11/07/2021)   PRAPARE - Administrator, Civil Service (Medical): No    Lack of Transportation (Non-Medical): No  Physical Activity: Sufficiently Active (11/07/2021)   Exercise Vital Sign    Days of Exercise per Week: 3 days    Minutes of Exercise per Session: 60 min  Stress: No Stress Concern Present (11/07/2021)   [employer] of  Occupational Health - Occupational Stress Questionnaire    Feeling of Stress : Not at all  Social Connections: Moderately Integrated (11/07/2021)   Social Connection and Isolation Panel [NHANES]    Frequency of Communication  with Friends and Family: More than three times a week    Frequency of Social Gatherings with Friends and Family: More than three times a week    Attends Religious Services: Never    Database administrator or Organizations: Yes    Attends Engineer, structural: More than 4 times per year    Marital Status: Married  Catering manager Violence: Not At Risk (11/07/2021)   Humiliation, Afraid, Rape, and Kick questionnaire    Fear of Current or Ex-Partner: No    Emotionally Abused: No    Physically Abused: No    Sexually Abused: No     BP (!) 160/70 (BP Location: Left Arm, Patient Position: Sitting, Cuff Size: Normal)   Pulse (!) 53   Ht 6\' 1"  (1.854 m)   Wt 286 lb (129.7 kg)   SpO2 97%   BMI 37.73 kg/m   Physical Exam:  Well appearing NAD HEENT: Unremarkable Neck:  No JVD, no thyromegally Lymphatics:  No adenopathy Back:  No CVA tenderness Lungs:  Clear HEART:  Regular rate rhythm, no murmurs, no rubs, no clicks Abd:  soft, positive bowel sounds, no organomegally, no rebound, no guarding Ext:  2 plus pulses, no edema, no cyanosis, no clubbing Skin:  No rashes no nodules Neuro:  CN II through XII intact, motor grossly intact  EKG - reviewed. Sinus brady at 54/min  Assess/Plan: Sinus brady - It is unclear if his sinus node dysfunction would be benefited by a PPM. He definitely has nocturnal bradycardia. I have a few rhythm strips which show nocturnal brady. I will await his final monitor results. I suspect he has some chronotropic incompetence though I am not sure how bad this is. He notes that he does not walk much due to a bad back. HTN - his sbp is elevated. No change.   Sharlot Gowda Senita Corredor,MD

## 2022-12-22 ENCOUNTER — Telehealth: Payer: Self-pay | Admitting: Cardiology

## 2022-12-22 ENCOUNTER — Ambulatory Visit: Payer: BLUE CROSS/BLUE SHIELD | Admitting: Adult Health

## 2022-12-22 ENCOUNTER — Ambulatory Visit: Payer: BLUE CROSS/BLUE SHIELD | Admitting: Cardiology

## 2022-12-22 NOTE — Telephone Encounter (Signed)
[employer] called to report critical EKG on patient

## 2022-12-22 NOTE — Telephone Encounter (Signed)
Caller hung up while waiting for transfer to triage staff.  Call back to their office 4.1 second pause  Underlying rhythm was sinus brady at 33 bmp. Auto triggered at 6:40 am.   Home monitor body guardian      Cardiac Monitor Alert  Date of alert:  12/22/2022   Patient Name: Carlos Foster  DOB: 1951/04/24  MRN: [id number]   [facility] Cardiologist: Dr Swaziland [facility] EP:  None    Monitor Information: Cardiac Event Monitor  Reason:  Pause and Sinus Huston Foley Ordering provider:  Dr Swaziland {   Alert Bradycardia - slowest HR: 33 This is the 1st alert for this rhythm.   Next Cardiology Appointment   Date:  November 13, 20254   Provider:  Dr Swaziland  The patient was contacted today.  He is asymptomatic. Pateitn state he was asleep when this occurred. Does not have any symptoms upon awakening or now at this time. 4.1 second pause  Underlying rhythm was sinus brady at 33 bmp. Auto triggered at 6:40 am.  Arrhythmia, symptoms and history reviewed with sent to provider.   Plan:     Other:   Candie Chroman, LPN  7/82/9562 1:30 AM

## 2022-12-26 ENCOUNTER — Telehealth: Payer: Self-pay

## 2022-12-26 NOTE — Telephone Encounter (Addendum)
   Cardiac Monitor Alert  Date of alert:  12/24/22   Patient Name: Carlos Foster  DOB: 06/17/1951  MRN: [id number]   [facility] Cardiologist: Dr.Jordan  [facility] EP:  Dr.Taylor  Monitor Information: Cardiac Event Monitor [[employer]]  Reason:  Bradycardia Ordering provider:  Dr.Jordan   Alert Bradycardia - slowest HR: 20 This is the  This is day 27 of monitor alerts  alert for this rhythm.   Next Cardiology Appointment   Date:  Pending waiting on final monitor results  Provider:  Dr.taylor  The patient was contacted today.  He is asymptomatic. Arrhythmia, symptoms and history reviewed with Dr.Berry.   Plan:  No change appointment with Dr. Ladona Ridgel pending final monitor results.   Other: Spoke with patient who had multiple alerts over the weekend and he was asleep. He states he is fine and on his way to work.   Allante Whitmire [name], LPN  03/28/5620 3:08 PM

## 2022-12-26 NOTE — Telephone Encounter (Addendum)
   Cardiac Monitor Alert  Date of alert:  12/23/2022   Patient Name: Carlos Foster  DOB: 12/17/1950  MRN: [id number]   [facility] Cardiologist:Dr. Swaziland  [facility] EP: Dr.Taylor    Monitor Information: Cardiac Event Monitor [[employer]]  Reason:  Bradycardia Ordering provider:  Dr.Jordan   Alert Bradycardia - slowest HR: 28 This is the  We are on day 29 from monitor. This alert is from day 26. May 25th.  alert for this rhythm.   Next Cardiology Appointment   Date:  Pending  Provider:  Dr.Taylor  The patient was contacted today.  He is asymptomatic. Patient was asleep at the time of monitor alert. States he feels fine.  Arrhythmia, symptoms and history reviewed with Dr.Berry.   Plan: Next appointment is pending waiting for final monitor results per Dr. Ladona Ridgel   Other:   Judene Companion, LPN  4/69/6295 2:84 AM

## 2022-12-26 NOTE — Telephone Encounter (Signed)
   Cardiac Monitor Alert  Date of alert:  12/25/2022   Patient Name: Carlos Foster  DOB: 1950/12/17  MRN: [id number]   [facility] Cardiologist: [facility] EP:  Dr. Ladona Ridgel    Monitor Information: Cardiac Event Monitor [[employer]]  Reason:  Bradycardia Ordering provider:  Dr. Swaziland   Alert Bradycardia - slowest HR: 29 This is the  we are on day 28 of  alert for this rhythm.   Next Cardiology Appointment   Date:  Pending depending on final monitor results  Provider:  Dr.taylor   The patient was contacted today.  He is asymptomatic. Arrhythmia, symptoms and history reviewed with Dr. Allyson Sabal   Plan:  Pending appointment with Dr. Ladona Ridgel pending final monitor results   Other: Patient had multiple alerts over the weekend he states he feels fine and today he is on his way to work.  Kyuss Hale [name], LPN  11/06/8117 1:47 PM

## 2022-12-31 NOTE — Telephone Encounter (Signed)
Early morning asymptomatic pauses are not actionable. Watchful waiting.

## 2023-01-02 ENCOUNTER — Ambulatory Visit: Payer: Medicare Other | Attending: Cardiology

## 2023-01-02 DIAGNOSIS — R001 Bradycardia, unspecified: Secondary | ICD-10-CM

## 2023-01-08 ENCOUNTER — Telehealth: Payer: Self-pay | Admitting: Cardiovascular Disease

## 2023-01-08 NOTE — Telephone Encounter (Signed)
Patient states he has a box for his sleep study, but he was advised not to use it until he was contacted by our office, but he hasn't heard from anyone. Patient states it has been about 1.5 months and he would like an update as soon aspossible.

## 2023-01-09 ENCOUNTER — Encounter: Payer: Self-pay | Admitting: Cardiology

## 2023-01-12 ENCOUNTER — Institutional Professional Consult (permissible substitution): Payer: BLUE CROSS/BLUE SHIELD | Admitting: Internal Medicine

## 2023-01-12 NOTE — Telephone Encounter (Signed)
**Note De-Identified Carlos Foster Obfuscation** 6/12-READY-Per Liza at [facility] , no PA is req for [employer].

## 2023-01-15 ENCOUNTER — Encounter (INDEPENDENT_AMBULATORY_CARE_PROVIDER_SITE_OTHER): Payer: Medicare Other | Admitting: Cardiology

## 2023-01-15 ENCOUNTER — Telehealth: Payer: Self-pay

## 2023-01-15 DIAGNOSIS — R001 Bradycardia, unspecified: Secondary | ICD-10-CM

## 2023-01-15 DIAGNOSIS — R0683 Snoring: Secondary | ICD-10-CM

## 2023-01-15 DIAGNOSIS — G4733 Obstructive sleep apnea (adult) (pediatric): Secondary | ICD-10-CM | POA: Diagnosis not present

## 2023-01-15 NOTE — Telephone Encounter (Signed)
I called the patient to speak with him about the Pin number for the itamar device. LVM for him to return my call.

## 2023-01-15 NOTE — Telephone Encounter (Signed)
Called and made the patient aware that he may proceed with the Itamar Home Sleep Study. PIN # provided to the patient. Patient made aware that he will be contacted after the test has been read with the results and any recommendations. Patient verbalized understanding and thanked me for the call.   

## 2023-01-17 ENCOUNTER — Ambulatory Visit: Payer: Medicare Other | Attending: Cardiology

## 2023-01-17 DIAGNOSIS — R001 Bradycardia, unspecified: Secondary | ICD-10-CM

## 2023-01-17 DIAGNOSIS — G4733 Obstructive sleep apnea (adult) (pediatric): Secondary | ICD-10-CM

## 2023-01-17 NOTE — Procedures (Signed)
        SLEEP STUDY REPORT Patient Information Study Date: 01/15/2023 Patient Name: Carlos Foster Patient ID: [id number] Birth Date: 16-Jul-1951 Age: 72 Gender: Male BMI: 37.4 (W=282 lb, H=6' 1'') Referring Physician: Peter Swaziland, MD  TEST DESCRIPTION: Home sleep apnea testing was completed using the WatchPat, a Type 1 device, utilizing peripheral arterial tonometry (PAT), chest movement, actigraphy, pulse oximetry, pulse rate, body position and snore. AHI was calculated with apnea and hypopnea using valid sleep time as the denominator. RDI includes apneas, hypopneas, and RERAs. The data acquired and the scoring of sleep and all associated events were performed in accordance with the recommended standards and specifications as outlined in the AASM Manual for the Scoring of Sleep and Associated Events 2.2.0 (2015).   FINDINGS: 1.  No evidence of Obstructive Sleep Apnea with AHI0.4 /hr.  2.  No Central Sleep Apnea. 3.  Oxygen desaturations as low as 87%. 4.  Minimal snoring was present. O2 sats were < 88% for 0 minutes. 5.  Total sleep time was 7 hrs and 10 min. 6.  23.2% of total sleep time was spent in REM sleep.  7.  Normal sleep onset latency at17  min.  8.  Shortened REM sleep onset latency at 57 min.  9.  Total awakenings were 4 .   DIAGNOSIS:  Normal study with no significant sleep disordered breathing.  RECOMMENDATIONS:   1. Normal study with no significant sleep disordered breathing.  2.  Healthy sleep recommendations include:  adequate nightly sleep (normal 7-9 hrs/night), avoidance of caffeine after noon and alcohol near bedtime, and maintaining a sleep environment that is cool, dark and quiet.  3.  Weight loss for overweight patients is recommended.    4.  Snoring recommendations include:  weight loss where appropriate, side sleeping, and avoidance of alcohol before bed.  5.  Operation of motor vehicle or dangerous equipment must be avoided when feeling drowsy,  excessively sleepy, or mentally fatigued.    6.  An ENT consultation which may be useful for specific causes of and possible treatment of bothersome snoring.   7. Weight loss may be of benefit in reducing the severity of snoring.    Signature:   Armanda Magic, MD; [facility]; Diplomat, American Board of Sleep Medicine Electronically Signed: 01/17/2023 10:58:33 AM

## 2023-01-23 ENCOUNTER — Telehealth: Payer: Self-pay

## 2023-01-23 NOTE — Telephone Encounter (Signed)
Left detailed VM, per DPR, with sleep study results and recommendations. Left call back number for any questions.

## 2023-01-23 NOTE — Telephone Encounter (Signed)
-----   Message from Quintella Reichert, MD sent at 01/17/2023 11:00 AM EDT ----- Please let patient know that sleep study showed no significant sleep apnea.

## 2023-05-29 ENCOUNTER — Ambulatory Visit (INDEPENDENT_AMBULATORY_CARE_PROVIDER_SITE_OTHER): Payer: Medicare Other | Admitting: Family Medicine

## 2023-05-29 ENCOUNTER — Encounter: Payer: Self-pay | Admitting: Family Medicine

## 2023-05-29 VITALS — BP 142/78 | HR 47 | Temp 98.1°F | Ht 73.0 in | Wt 278.8 lb

## 2023-05-29 DIAGNOSIS — M7060 Trochanteric bursitis, unspecified hip: Secondary | ICD-10-CM | POA: Diagnosis not present

## 2023-05-29 DIAGNOSIS — M6208 Separation of muscle (nontraumatic), other site: Secondary | ICD-10-CM | POA: Diagnosis not present

## 2023-05-29 DIAGNOSIS — B351 Tinea unguium: Secondary | ICD-10-CM | POA: Diagnosis not present

## 2023-05-29 MED ORDER — PREDNISONE 20 MG PO TABS
ORAL_TABLET | ORAL | 0 refills | Status: DC
Start: 2023-05-29 — End: 2023-07-05

## 2023-05-29 NOTE — Patient Instructions (Signed)
Ice your hips for 5 minutes at a time.  Prednisone with food.  Update me as needed.  Take care.  Glad to see you.

## 2023-05-29 NOTE — Progress Notes (Unsigned)
Hip pain, bilateral.  More pain walking any distance.  H/o bursitis.  No pain laying on side now.  Back hurts with standing but L leg numbness isn't happening now.  Standing doesn't cause hip pain.  No knee pain.  No FCNAVD. No calf claudication.  Normal BP pulse.    He had diastasis rectus.  Not painful, d/w pt.    Meds, vitals, and allergies reviewed.   ROS: Per HPI unless specifically indicated in ROS section   Normal int rotation hips B Greater troch not ttp B SLR neg B  B 1st toenail fungus.

## 2023-05-30 DIAGNOSIS — M6208 Separation of muscle (nontraumatic), other site: Secondary | ICD-10-CM | POA: Insufficient documentation

## 2023-05-30 DIAGNOSIS — B351 Tinea unguium: Secondary | ICD-10-CM | POA: Insufficient documentation

## 2023-05-30 NOTE — Assessment & Plan Note (Signed)
Discussed with patient, I would defer systemic treatment unless he had significantly worse symptoms.  He can update me as needed.

## 2023-05-30 NOTE — Assessment & Plan Note (Signed)
Discussed observation if nonbothersome.  He can update [place] as needed.

## 2023-05-30 NOTE — Assessment & Plan Note (Signed)
He has a history of trochanteric bursitis in the past.  His trochanteric bursa are not tender at the office visit but I question if those are both getting irritated with walking.  Discussed options. Ice both hips for 5 minutes at a time.  Prednisone with food.  Steroid cautions discussed with patient.  Update me as needed.  He agrees to plan.

## 2023-05-31 ENCOUNTER — Telehealth: Payer: Self-pay | Admitting: Cardiology

## 2023-05-31 ENCOUNTER — Ambulatory Visit: Payer: Medicare Other | Attending: Internal Medicine | Admitting: Internal Medicine

## 2023-05-31 ENCOUNTER — Encounter: Payer: Self-pay | Admitting: Internal Medicine

## 2023-05-31 VITALS — BP 140/72 | HR 46 | Ht 73.0 in | Wt 277.8 lb

## 2023-05-31 DIAGNOSIS — I495 Sick sinus syndrome: Secondary | ICD-10-CM | POA: Diagnosis not present

## 2023-05-31 NOTE — Progress Notes (Signed)
HPI Carlos Foster returns for ongoing evaluation of sinus node dysfunction. He is a pleasant 72 yo man with a h/o HTN, who has been bothered by fatigue. He has dyslipidemia and hypothyroidism. He has a h/o junctional rhythm over a year ago but no long pauses. He developed bradycardia initially after Covid. He has not had syncope.Since I saw him last he has had progressively worsening trouble with exertion. He cannot get his HR above 85/min and then has to stop and rest. No syncope. He runs in the 40's at baseline. Allergies  Allergen Reactions   Flexeril [Cyclobenzaprine]     Gumline irritation/pulse irregularity   Gabapentin Other (See Comments)    Leg swelling   Lyrica [Pregabalin]     Leg swelling     Current Outpatient Medications  Medication Sig Dispense Refill   acetaminophen (TYLENOL) 500 MG tablet Take 500 mg by mouth every 6 (six) hours as needed.     amlodipine-atorvastatin (CADUET) 10-10 MG tablet Take 1 tablet by mouth daily. 90 tablet 3   aspirin EC 81 MG tablet Take 81 mg by mouth daily.     doxazosin (CARDURA) 2 MG tablet Take 1 tablet (2 mg total) by mouth daily. 90 tablet 3   doxycycline (VIBRAMYCIN) 100 MG capsule Take 1 capsule (100 mg total) by mouth 2 (two) times daily as needed. For rosacea flare ups 60 capsule 3   levothyroxine (SYNTHROID) 150 MCG tablet TAKE 1 TABLET BY MOUTH ONCE A DAY BEFOREBREAKFAST 90 tablet 3   lisinopril (ZESTRIL) 40 MG tablet Take 1 tablet (40 mg total) by mouth daily. 90 tablet 3   metroNIDAZOLE (METROGEL) 1 % gel Apply 1 application  topically daily as needed for rosacea. 60 g 3   naproxen sodium (ALEVE) 220 MG tablet Take 220 mg by mouth daily as needed.     Polyethyl Glycol-Propyl Glycol 0.4-0.3 % SOLN Place 1 drop into both eyes 3 (three) times daily as needed (for dry eyes/contact irritation). 5 mL 12   potassium chloride SA (KLOR-CON M) 20 MEQ tablet TAKE 2 TABLETS BY MOUTH TWICE A DAY 360 tablet 3   predniSONE (DELTASONE) 20 MG  tablet 2 a day for 4 days, then 1 a day for 4 days, then 0.5 a day for 4 days. With food. Don't take with ibuprofen or aleve. 14 tablet 0   spironolactone (ALDACTONE) 25 MG tablet Take 1 tablet (25 mg total) by mouth daily. 90 tablet 3   No current facility-administered medications for this visit.     Past Medical History:  Diagnosis Date   Arthritis    feet   GERD (gastroesophageal reflux disease)    History of hiatal hernia    Hyperlipidemia    Hypertension    Hypokalemia    Hypothyroidism    Kidney stones    Rosacea     ROS:   All systems reviewed and negative except as noted in the HPI.   Past Surgical History:  Procedure Laterality Date   ACHILLES TENDON SURGERY Left 06/01/2020   C2 C3 Laminectomy  07/09/2016   COLONOSCOPY     COLONOSCOPY  11/2019   ETT  04/15/03   wnl ST decreased < 1mm V6   EVACUATION OF CERVICAL HEMATOMA N/A 07/21/2016   Procedure: RE-EXPLORATION OF ANTERIOR CERVICAL WOUND AND EVACUATION OF CERVICAL HEMATOMA;  Surgeon: Donalee Citrin, MD;  Location: [facility] OR;  Service: Neurosurgery;  Laterality: N/A;   HAMMER TOE SURGERY Left 01/06/2016   Procedure: LEFT  SECOND HAMMER TOE CORRECTION;  Surgeon: Toni Arthurs, MD;  Location: [facility];  Service: Orthopedics;  Laterality: Left;   hematoma removal     around larnyx   HEMORRHOID SURGERY  1982   HERNIA REPAIR Bilateral 2015   inguinal   LAMINECTOMY  12/02/99   C4/5, 5/6, 6/7 Fusion (Dr. Jeral Fruit)   LUMBAR LAMINECTOMY/DECOMPRESSION MICRODISCECTOMY Left 03/02/2017   Procedure: Laminectomy and Foraminotomy - L2-L3 - L3-L4 - left;  Surgeon: Donalee Citrin, MD;  Location: [facility] OR;  Service: Neurosurgery;  Laterality: Left;   METATARSAL OSTEOTOMY Left 01/06/2016   Procedure: LEFT SECOND METATARSAL WEIL OSTEOTOMY;  Surgeon: Toni Arthurs, MD;  Location: [facility];  Service: Orthopedics;  Laterality: Left;   Morton's Neuroma Repair Right 1982   foot   PROSTATE BIOPSY  12/10/06   Benign Annabell Howells)      Family History  Problem Relation Age of Onset   Kidney disease Mother        Acute kidney failure   Heart disease Mother        CAD   Hypertension Mother    Diabetes Mother        DM and diabetic retinopathy, toe amputation   Diabetes Other    Heart disease Father    AAA (abdominal aortic aneurysm) Father    Colon polyps Father    Lymphoma Maternal Uncle        X 2   Depression Neg Hx    Alcohol abuse Neg Hx    Drug abuse Neg Hx    Stroke Neg Hx    Prostate cancer Neg Hx    Colon cancer Neg Hx    Esophageal cancer Neg Hx    Stomach cancer Neg Hx    Rectal cancer Neg Hx      Social History   Socioeconomic History   Marital status: Married    Spouse name: Not on file   Number of children: 1   Years of education: Not on file   Highest education level: Not on file  Occupational History   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   Laid off from work 2012   Engineer, structural for [employer]   Social Determinants of [employer]: Low Risk  (11/07/2021)   Overall Financial Resource Strain (CARDIA)    Difficulty of Paying Living Expenses: Not hard at all  Food Insecurity: No Food Insecurity (11/07/2021)   Hunger Vital Sign    Worried About Running Out of Food in the Last Year: Never true    Ran Out of Food in the Last Year: Never true  Transportation Needs: No Transportation Needs (11/07/2021)   PRAPARE - Administrator, Civil Service (Medical): No    Lack of Transportation (Non-Medical): No  Physical Activity: Sufficiently  Active (11/07/2021)   Exercise Vital Sign    Days of Exercise per Week: 3 days    Minutes of Exercise per Session: 60 min  Stress: No  Stress Concern Present (11/07/2021)   [employer] of Occupational Health - Occupational Stress Questionnaire    Feeling of Stress : Not at all  Social Connections: Moderately Integrated (11/07/2021)   Social Connection and Isolation Panel [NHANES]    Frequency of Communication with Friends and Family: More than three times a week    Frequency of Social Gatherings with Friends and Family: More than three times a week    Attends Religious Services: Never    Database administrator or Organizations: Yes    Attends Engineer, structural: More than 4 times per year    Marital Status: Married  Catering manager Violence: Not At Risk (11/07/2021)   Humiliation, Afraid, Rape, and Kick questionnaire    Fear of Current or Ex-Partner: No    Emotionally Abused: No    Physically Abused: No    Sexually Abused: No     BP (!) 140/72 (BP Location: Left Arm, Patient Position: Sitting, Cuff Size: Large)   Pulse (!) 46   Ht 6\' 1"  (1.854 m)   Wt 277 lb 12.8 oz (126 kg)   SpO2 97%   BMI 36.65 kg/m   Physical Exam:  obese appearing NAD HEENT: Unremarkable Neck:  No JVD, no thyromegally Lymphatics:  No adenopathy Back:  No CVA tenderness Lungs:  Clear with no wheezes HEART:  Regular rate rhythm, no murmurs, no rubs, no clicks Abd:  soft, positive bowel sounds, no organomegally, no rebound, no guarding Ext:  2 plus pulses, no edema, no cyanosis, no clubbing Skin:  No rashes no nodules Neuro:  CN II through XII intact, motor grossly intact   Assess/Plan:  Sinus brady - at this point he is unable to get his HR above 90/min with exercise. He has chronotropic incompetence and sinus node dysfunction and I have recommended he undergo DDD PM. I will ask for Biotronik with there sensor.   HTN - his sbp is elevated. No change but I would expect to add a beta  blocker once his PPM is in place.   Sharlot Gowda Jlee Harkless,MD

## 2023-05-31 NOTE — Telephone Encounter (Signed)
Spoke to patient to notify him appointment is already cancelled.

## 2023-05-31 NOTE — Patient Instructions (Addendum)
Medication Instructions:  Your physician recommends that you continue on your current medications as directed. Please refer to the Current Medication list given to you today.  *If you need a refill on your cardiac medications before your next appointment, please call your pharmacy*  Lab Work: Your physician recommends that you return for lab work the first week of December for BMET and CBC.  If you have labs (blood work) drawn today and your tests are completely normal, you will receive your results only by: MyChart Message (if you have MyChart) OR A paper copy in the mail If you have any lab test that is abnormal or we need to change your treatment, we will call you to review the results.  Testing/Procedures: Your physician has recommended that you have a pacemaker inserted 07/11/23. A pacemaker is a small device that is placed under the skin of your chest or abdomen to help control abnormal heart rhythms. This device uses electrical pulses to prompt the heart to beat at a normal rate. Pacemakers are used to treat heart rhythms that are too slow. Wire (leads) are attached to the pacemaker that goes into the chambers of you heart. This is done in the hospital and usually requires and overnight stay. Please see the instruction sheet given to you today for more information.  Follow-Up: At [facility], you and your health needs are our priority.  As part of our continuing mission to provide you with exceptional heart care, we have created designated Provider Care Teams.  These Care Teams include your primary Cardiologist (physician) and Advanced Practice Providers (APPs -  Physician Assistants and Nurse Practitioners) who all work together to provide you with the care you need, when you need it.  Your next appointment:   10 to 14 days after procedure for a wound check with our Device Clinic  The format for your next appointment:   In Person  Provider:   Lewayne Bunting, MD{or one of the  following Advanced Practice Providers on your designated Care Team:   Francis Dowse, [place] Casimiro Needle "Mardelle Matte" [place], [place] Earnest Rosier, NP   Important Information About Sugar

## 2023-05-31 NOTE — Telephone Encounter (Signed)
Pt is requesting a callback regarding him wanting to know if he should keep his appt with Dr. Swaziland or not. Please advise

## 2023-06-01 ENCOUNTER — Other Ambulatory Visit: Payer: Self-pay | Admitting: Cardiology

## 2023-06-06 NOTE — Telephone Encounter (Signed)
Received message from Dr. Swaziland:   I would cancel his appt. With me. Can follow up after Dr Lubertha Basque work   Peter Swaziland MD, [facility]   ________________________________________________________ Jeanene Erb and spoke to patient  Relayed Dr. Elvis Coil message  Patient agrees with plan, no questions at this time   06/13/23 appointment with Dr. Swaziland canceled per provider

## 2023-06-06 NOTE — Telephone Encounter (Signed)
Pt asked about appt again. He is still scheduled for it. He wants to know if he still needs to see Dr. Swaziland or wait until after procedure with Dr. Ladona Ridgel.

## 2023-06-07 ENCOUNTER — Other Ambulatory Visit: Payer: Self-pay

## 2023-06-07 DIAGNOSIS — Z01812 Encounter for preprocedural laboratory examination: Secondary | ICD-10-CM

## 2023-06-07 NOTE — Progress Notes (Signed)
Labs ordered and released. 

## 2023-06-13 ENCOUNTER — Ambulatory Visit: Payer: BLUE CROSS/BLUE SHIELD | Admitting: Cardiology

## 2023-06-25 ENCOUNTER — Ambulatory Visit: Payer: Medicare Other | Attending: Internal Medicine

## 2023-06-26 LAB — BASIC METABOLIC PANEL
BUN/Creatinine Ratio: 18 (ref 10–24)
BUN: 18 mg/dL (ref 8–27)
CO2: 25 mmol/L (ref 20–29)
Calcium: 9.1 mg/dL (ref 8.6–10.2)
Chloride: 107 mmol/L — ABNORMAL HIGH (ref 96–106)
Creatinine, Ser: 1 mg/dL (ref 0.76–1.27)
Glucose: 94 mg/dL (ref 70–99)
Potassium: 4.5 mmol/L (ref 3.5–5.2)
Sodium: 146 mmol/L — ABNORMAL HIGH (ref 134–144)
eGFR: 81 mL/min/{1.73_m2} (ref >59)

## 2023-06-26 LAB — CBC
Hematocrit: 41.1 % (ref 37.5–51.0)
Hemoglobin: 13.5 g/dL (ref 13.0–17.7)
MCH: 30.3 pg (ref 26.6–33.0)
MCHC: 32.8 g/dL (ref 31.5–35.7)
MCV: 92 fL (ref 79–97)
Platelets: 156 10*3/uL (ref 150–450)
RBC: 4.46 x10E6/uL (ref 4.14–5.80)
RDW: 12.4 % (ref 11.6–15.4)
WBC: 5.2 10*3/uL (ref 3.4–10.8)

## 2023-07-09 NOTE — Pre-Procedure Instructions (Signed)
Instructed patient on the following items: Arrival time 0630 Nothing to eat or drink after midnight No meds AM of procedure Responsible person to drive you home and stay with you for 24 hrs Wash with special soap night before and morning of procedure  

## 2023-07-11 ENCOUNTER — Ambulatory Visit (HOSPITAL_COMMUNITY)
Admission: RE | Disposition: A | Discharge status: Home / Self Care | Payer: Self-pay | Source: Home / Self Care | Attending: Internal Medicine

## 2023-07-11 ENCOUNTER — Ambulatory Visit (HOSPITAL_COMMUNITY): Payer: Medicare Other

## 2023-07-11 ENCOUNTER — Other Ambulatory Visit: Payer: Self-pay

## 2023-07-11 ENCOUNTER — Ambulatory Visit (HOSPITAL_COMMUNITY)
Admission: RE | Admit: 2023-07-11 | Discharge: 2023-07-11 | Disposition: A | Discharge status: Home / Self Care | Payer: Medicare Other | Attending: Internal Medicine | Admitting: Internal Medicine

## 2023-07-11 DIAGNOSIS — I495 Sick sinus syndrome: Secondary | ICD-10-CM | POA: Insufficient documentation

## 2023-07-11 DIAGNOSIS — R001 Bradycardia, unspecified: Secondary | ICD-10-CM | POA: Diagnosis not present

## 2023-07-11 DIAGNOSIS — Z8249 Family history of ischemic heart disease and other diseases of the circulatory system: Secondary | ICD-10-CM | POA: Diagnosis not present

## 2023-07-11 DIAGNOSIS — I10 Essential (primary) hypertension: Secondary | ICD-10-CM | POA: Insufficient documentation

## 2023-07-11 DIAGNOSIS — Z950 Presence of cardiac pacemaker: Secondary | ICD-10-CM | POA: Diagnosis not present

## 2023-07-11 HISTORY — PX: PACEMAKER IMPLANT: EP1218

## 2023-07-11 SURGERY — PACEMAKER IMPLANT
Anesthesia: LOCAL

## 2023-07-11 MED ORDER — SODIUM CHLORIDE 0.9 % IV SOLN: 80.0000 mg | INTRAVENOUS | Status: AC

## 2023-07-11 MED ORDER — SODIUM CHLORIDE 0.9 % IV SOLN: INTRAVENOUS | Status: DC

## 2023-07-11 MED ORDER — SODIUM CHLORIDE 0.9 % IV SOLN
3.0000 g | INTRAVENOUS | Status: AC
  Administered 2023-07-11: 3 g via INTRAVENOUS
  Filled 2023-07-11: qty 3

## 2023-07-11 MED ORDER — CHLORHEXIDINE GLUCONATE 4 % EX SOLN
4.0000 | Freq: Once | CUTANEOUS | Status: AC
  Administered 2023-07-11: 4 via TOPICAL
  Filled 2023-07-11: qty 60

## 2023-07-11 MED ORDER — MIDAZOLAM HCL 2 MG/2ML IJ SOLN
INTRAMUSCULAR | Status: AC
  Filled 2023-07-11: qty 2

## 2023-07-11 MED ORDER — SODIUM CHLORIDE 0.9 % IV SOLN
INTRAVENOUS | Status: AC
  Administered 2023-07-11: 80 mg
  Filled 2023-07-11: qty 2

## 2023-07-11 MED ORDER — FENTANYL CITRATE (PF) 100 MCG/2ML IJ SOLN
INTRAMUSCULAR | Status: DC | PRN
  Administered 2023-07-11 (×2): 12.5 ug via INTRAVENOUS

## 2023-07-11 MED ORDER — FENTANYL CITRATE (PF) 100 MCG/2ML IJ SOLN
INTRAMUSCULAR | Status: AC
  Filled 2023-07-11: qty 2

## 2023-07-11 MED ORDER — LIDOCAINE HCL (PF) 1 % IJ SOLN
INTRAMUSCULAR | Status: DC | PRN
  Administered 2023-07-11: 50 mL

## 2023-07-11 MED ORDER — MIDAZOLAM HCL 5 MG/5ML IJ SOLN
INTRAMUSCULAR | Status: DC | PRN
  Administered 2023-07-11 (×2): 1 mg via INTRAVENOUS

## 2023-07-11 MED ORDER — HEPARIN (PORCINE) IN NACL 1000-0.9 UT/500ML-% IV SOLN
INTRAVENOUS | Status: DC | PRN
  Administered 2023-07-11: 500 mL

## 2023-07-11 MED ORDER — POVIDONE-IODINE 10 % EX SWAB
2.0000 | Freq: Once | CUTANEOUS | Status: AC
  Administered 2023-07-11: 2 via TOPICAL

## 2023-07-11 MED ORDER — LIDOCAINE HCL (PF) 1 % IJ SOLN
INTRAMUSCULAR | Status: AC
Start: 2023-07-11 — End: ?
  Filled 2023-07-11: qty 60

## 2023-07-11 MED ORDER — ACETAMINOPHEN 325 MG PO TABS: 325.0000 mg | ORAL_TABLET | ORAL | Status: DC | PRN

## 2023-07-11 MED ORDER — ONDANSETRON HCL 4 MG/2ML IJ SOLN
4.0000 mg | Freq: Four times a day (QID) | INTRAMUSCULAR | Status: DC | PRN
Start: 2023-07-11 — End: 2023-07-11

## 2023-07-11 SURGICAL SUPPLY — 11 items
CABLE SURGICAL S-101-97-12 (CABLE) ×1 IMPLANT
KIT ACCESSORY SELECTRA FIX CVD (MISCELLANEOUS) IMPLANT
LEAD SELECTRA 3D-55-42 (CATHETERS) IMPLANT
LEAD SOLIA S PRO MRI 53 (Lead) IMPLANT
LEAD SOLIA S PRO MRI 60 (Lead) IMPLANT
PACEMAKER AMVIA DR-T 460163 (Pacemaker) IMPLANT
PAD DEFIB RADIO PHYSIO CONN (PAD) ×1 IMPLANT
PPM AMVIA DR-T 460163 (Pacemaker) ×1 IMPLANT
SHEATH 7FR PRELUDE SNAP 13 (SHEATH) IMPLANT
SHEATH 9FR PRELUDE SNAP 13 (SHEATH) IMPLANT
TRAY PACEMAKER INSERTION (PACKS) ×1 IMPLANT

## 2023-07-11 NOTE — Discharge Instructions (Signed)
After Your Pacemaker   You have a Biotronik Pacemaker  ACTIVITY Do not lift your arm above shoulder height for 1 week after your procedure. After 7 days, you may progress as below.  You should remove your sling 24 hours after your procedure, unless otherwise instructed by your provider.     Wednesday July 18, 2023  Thursday July 19, 2023 Friday July 20, 2023 Saturday July 21, 2023   Do not lift, push, pull, or carry anything over 10 pounds with the affected arm until 6 weeks (Wednesday August 22, 2023 ) after your procedure.   You may drive AFTER your wound check, unless you have been told otherwise by your provider.   Ask your healthcare provider when you can go back to work   INCISION/Dressing If you are on a blood thinner such as Coumadin, Xarelto, Eliquis, Plavix, or Pradaxa please confirm with your provider when this should be resumed.  If large square, outer bandage is left in place, this can be removed after 24 hours from your procedure. Do not remove steri-strips or glue as below.   If a PRESSURE DRESSING (a bulky dressing that usually goes up over your shoulder) was applied or left in place, please follow instructions given by your provider on when to return to have this removed.   Monitor your Pacemaker site for redness, swelling, and drainage. Call the device clinic at [phone] if you experience these symptoms or fever/chills.  If your incision is sealed with Steri-strips or staples, you may shower 7 days after your procedure or when told by your provider. Do not remove the steri-strips or let the shower hit directly on your site. You may wash around your site with soap and water.    If you were discharged in a sling, please do not wear this during the day more than 48 hours after your surgery unless otherwise instructed. This may increase the risk of stiffness and soreness in your shoulder.   Avoid lotions, ointments, or perfumes over your incision  until it is well-healed.  You may use a hot tub or a pool AFTER your wound check appointment if the incision is completely closed.  Pacemaker Alerts:  Some alerts are vibratory and others beep. These are NOT emergencies. Please call our office to let us know. If this occurs at night or on weekends, it can wait until the next business day. Send a remote transmission.  If your device is capable of reading fluid status (for heart failure), you will be offered monthly monitoring to review this with you.   DEVICE MANAGEMENT Remote monitoring is used to monitor your pacemaker from home. This monitoring is scheduled every 91 days by our office. It allows [place] to keep an eye on the functioning of your device to ensure it is working properly. You will routinely see your Electrophysiologist annually (more often if necessary).   You should receive your ID card for your new device in 4-8 weeks. Keep this card with you at all times once received. Consider wearing a medical alert bracelet or necklace.  Your Pacemaker may be MRI compatible. This will be discussed at your next office visit/wound check.  You should avoid contact with strong electric or magnetic fields.   Do not use amateur (ham) radio equipment or electric (arc) welding torches. MP3 player headphones with magnets should not be used. Some devices are safe to use if held at least 12 inches (30 cm) from your Pacemaker. These include power tools, lawn mowers,  and speakers. If you are unsure if something is safe to use, ask your health care provider.  When using your cell phone, hold it to the ear that is on the opposite side from the Pacemaker. Do not leave your cell phone in a pocket over the Pacemaker.  You may safely use electric blankets, heating pads, computers, and microwave ovens.  Call the office right away if: You have chest pain. You feel more short of breath than you have felt before. You feel more light-headed than you have felt  before. Your incision starts to open up.  This information is not intended to replace advice given to you by your health care provider. Make sure you discuss any questions you have with your health care provider.

## 2023-07-11 NOTE — H&P (Signed)
HPI [name] returns for ongoing evaluation of sinus node dysfunction. He is a pleasant 72 yo man with a h/o HTN, who has been bothered by fatigue. He has dyslipidemia and hypothyroidism. He has a h/o junctional rhythm over a year ago but no long pauses. He developed bradycardia initially after Covid. He has not had syncope.Since I saw him last he has had progressively worsening trouble with exertion. He cannot get his HR above 85/min and then has to stop and rest. No syncope. He runs in the 40's at baseline. Allergies       Allergies  Allergen Reactions   Flexeril [Cyclobenzaprine]        Gumline irritation/pulse irregularity   Gabapentin Other (See Comments)      Leg swelling   Lyrica [Pregabalin]        Leg swelling                Current Outpatient Medications  Medication Sig Dispense Refill   acetaminophen (TYLENOL) 500 MG tablet Take 500 mg by mouth every 6 (six) hours as needed.       amlodipine-atorvastatin (CADUET) 10-10 MG tablet Take 1 tablet by mouth daily. 90 tablet 3   aspirin EC 81 MG tablet Take 81 mg by mouth daily.       doxazosin (CARDURA) 2 MG tablet Take 1 tablet (2 mg total) by mouth daily. 90 tablet 3   doxycycline (VIBRAMYCIN) 100 MG capsule Take 1 capsule (100 mg total) by mouth 2 (two) times daily as needed. For rosacea flare ups 60 capsule 3   levothyroxine (SYNTHROID) 150 MCG tablet TAKE 1 TABLET BY MOUTH ONCE A DAY BEFOREBREAKFAST 90 tablet 3   lisinopril (ZESTRIL) 40 MG tablet Take 1 tablet (40 mg total) by mouth daily. 90 tablet 3   metroNIDAZOLE (METROGEL) 1 % gel Apply 1 application  topically daily as needed for rosacea. 60 g 3   naproxen sodium (ALEVE) 220 MG tablet Take 220 mg by mouth daily as needed.       Polyethyl Glycol-Propyl Glycol 0.4-0.3 % SOLN Place 1 drop into both eyes 3 (three) times daily as needed (for dry eyes/contact irritation). 5 mL 12   potassium chloride SA (KLOR-CON M) 20 MEQ tablet TAKE 2 TABLETS BY MOUTH TWICE A  DAY 360 tablet 3   predniSONE (DELTASONE) 20 MG tablet 2 a day for 4 days, then 1 a day for 4 days, then 0.5 a day for 4 days. With food. Don't take with ibuprofen or aleve. 14 tablet 0   spironolactone (ALDACTONE) 25 MG tablet Take 1 tablet (25 mg total) by mouth daily. 90 tablet 3      No current facility-administered medications for this visit.              Past Medical History:  Diagnosis Date   Arthritis      feet   GERD (gastroesophageal reflux disease)     History of hiatal hernia     Hyperlipidemia     Hypertension     Hypokalemia     Hypothyroidism     Kidney stones     Rosacea            ROS:    All systems reviewed and negative except as noted in the HPI.          Past Surgical History:  Procedure Laterality Date   ACHILLES TENDON SURGERY Left 06/01/2020   C2 C3 Laminectomy  07/09/2016   COLONOSCOPY       COLONOSCOPY   11/2019   ETT   04/15/03    wnl ST decreased < 1mm V6   EVACUATION OF CERVICAL HEMATOMA N/A 07/21/2016    Procedure: RE-EXPLORATION OF ANTERIOR CERVICAL WOUND AND EVACUATION OF CERVICAL HEMATOMA;  Surgeon: Donalee Citrin, MD;  Location: [facility] OR;  Service: Neurosurgery;  Laterality: N/A;   HAMMER TOE SURGERY Left 01/06/2016    Procedure: LEFT SECOND HAMMER TOE CORRECTION;  Surgeon: Toni Arthurs, MD;  Location: [facility];  Service: Orthopedics;  Laterality: Left;   hematoma removal        around larnyx   HEMORRHOID SURGERY   1982   HERNIA REPAIR Bilateral 2015    inguinal   LAMINECTOMY   12/02/99    C4/5, 5/6, 6/7 Fusion (Dr. Jeral Fruit)   LUMBAR LAMINECTOMY/DECOMPRESSION MICRODISCECTOMY Left 03/02/2017    Procedure: Laminectomy and Foraminotomy - L2-L3 - L3-L4 - left;  Surgeon: Donalee Citrin, MD;  Location: [facility];  Service: Neurosurgery;  Laterality: Left;   METATARSAL OSTEOTOMY Left 01/06/2016    Procedure: LEFT SECOND METATARSAL WEIL OSTEOTOMY;  Surgeon: Toni Arthurs, MD;  Location: [facility];  Service: Orthopedics;   Laterality: Left;   Morton's Neuroma Repair Right 1982    foot   PROSTATE BIOPSY   12/10/06    Benign Annabell Howells)                 Family History  Problem Relation Age of Onset   Kidney disease Mother          Acute kidney failure   Heart disease Mother          CAD   Hypertension Mother     Diabetes Mother          DM and diabetic retinopathy, toe amputation   Diabetes Other     Heart disease Father     AAA (abdominal aortic aneurysm) Father     Colon polyps Father     Lymphoma Maternal Uncle          X 2   Depression Neg Hx     Alcohol abuse Neg Hx     Drug abuse Neg Hx     Stroke Neg Hx     Prostate cancer Neg Hx     Colon cancer Neg Hx     Esophageal cancer Neg Hx     Stomach cancer Neg Hx     Rectal cancer Neg Hx              Social History         Socioeconomic History   Marital status: Married      Spouse name: Not on file   Number of children: 1   Years of education: Not on file   Highest education level: Not on file  Occupational History   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    Laid off from work  2012    Engineer, structural for [employer]    Social Determinants of Health        Financial Resource Strain: Low Risk  (11/07/2021)    Overall Financial Resource Strain (CARDIA)     Difficulty of Paying Living Expenses: Not hard at all  Food Insecurity: No Food Insecurity (11/07/2021)    Hunger Vital Sign     Worried About Running Out of Food in the Last Year: Never true     Ran Out of Food in the Last Year: Never true  Transportation  Needs: No Transportation Needs (11/07/2021)    PRAPARE - Therapist, art (Medical): No     Lack of Transportation (Non-Medical): No  Physical Activity: Sufficiently Active (11/07/2021)    Exercise Vital Sign     Days of Exercise per Week: 3 days     Minutes of Exercise per Session: 60 min  Stress: No Stress Concern Present (11/07/2021)    [employer] of Occupational Health - Occupational Stress Questionnaire     Feeling of Stress : Not at all  Social Connections: Moderately Integrated (11/07/2021)    Social Connection and Isolation Panel [NHANES]     Frequency of Communication with Friends and Family: More than three times a week     Frequency of Social Gatherings with Friends and Family: More than three times a week     Attends Religious Services: Never     Database administrator or Organizations: Yes     Attends Engineer, structural: More than 4 times per year     Marital Status: Married  Catering manager Violence: Not At Risk (11/07/2021)    Humiliation, Afraid, Rape, and Kick questionnaire     Fear of Current or Ex-Partner: No     Emotionally Abused: No     Physically Abused: No     Sexually Abused: No        BP (!) 140/72 (BP Location: Left Arm, Patient Position: Sitting, Cuff Size: Large)   Pulse (!) 46   Ht 6\' 1"  (1.854 m)   Wt 277 lb 12.8 oz (126 kg)   SpO2 97%   BMI 36.65 kg/m    Physical Exam:   obese appearing NAD HEENT: Unremarkable Neck:  No JVD, no thyromegally Lymphatics:  No adenopathy Back:  No CVA tenderness Lungs:  Clear with no wheezes HEART:  Regular rate rhythm, no murmurs, no rubs, no clicks Abd:  soft, positive bowel sounds, no organomegally, no rebound, no guarding Ext:  2 plus pulses, no edema, no cyanosis, no clubbing Skin:  No rashes no nodules Neuro:  CN II through XII intact, motor grossly intact     Assess/Plan:   Sinus brady - at this point he is unable to get his HR above 90/min with exercise. He  has chronotropic incompetence and sinus node dysfunction and I have recommended he undergo DDD PM. I will ask for Biotronik with there sensor.    HTN - his sbp is elevated. No change but I would expect to add a beta blocker once his PPM is in place.   Sharlot Gowda Teran Knittle,MD

## 2023-07-12 ENCOUNTER — Encounter (HOSPITAL_COMMUNITY): Payer: Self-pay | Admitting: Internal Medicine

## 2023-07-12 ENCOUNTER — Telehealth: Payer: Self-pay

## 2023-07-12 MED FILL — Midazolam HCl Inj 2 MG/2ML (Base Equivalent): INTRAMUSCULAR | Qty: 2 | Status: AC

## 2023-07-12 NOTE — Telephone Encounter (Signed)
Follow-up after same day discharge: Implant date: 07/11/2023 MD: Lewayne Bunting, MD Device: [employer] Amvia Dr-T Biotronik PPM  Location: Left Chest   Wound check visit: 07/26/2023 90 day MD follow-up: 10/09/2023  Remote Transmission received:yes  Dressing/sling removed: yes  Confirm OAC restart on: yes  Please continue to monitor your cardiac device site for redness, swelling, and drainage. Call the device clinic at [phone] if you experience these symptoms, fever/chills, or have questions about your device.   Remote monitoring is used to monitor your cardiac device from home. This monitoring is scheduled every 91 days by our office. It allows [place] to keep an eye on the functioning of your device to ensure it is working properly.

## 2023-07-26 ENCOUNTER — Ambulatory Visit: Payer: Medicare Other | Attending: Internal Medicine

## 2023-07-26 DIAGNOSIS — I495 Sick sinus syndrome: Secondary | ICD-10-CM | POA: Insufficient documentation

## 2023-07-26 LAB — CUP PACEART INCLINIC DEVICE CHECK
Date Time Interrogation Session: 20241226091417
Implantable Lead Connection Status: 753985
Implantable Lead Connection Status: 753985
Implantable Lead Implant Date: 20241211
Implantable Lead Implant Date: 20241211
Implantable Lead Location: 753859
Implantable Lead Location: 753860
Implantable Lead Model: 377
Implantable Lead Model: 377
Implantable Lead Serial Number: 8001707582
Implantable Lead Serial Number: 8001725483
Implantable Pulse Generator Implant Date: 20241211
Pulse Gen Serial Number: 1000336117

## 2023-07-26 NOTE — Progress Notes (Signed)

## 2023-07-26 NOTE — Patient Instructions (Signed)
   After Your Pacemaker   Monitor your pacemaker site for redness, swelling, and drainage. Call the device clinic at [phone] if you experience these symptoms or fever/chills.  Your incision was closed with Steri-strips or staples:  You may shower 7 days after your procedure and wash your incision with soap and water. Avoid lotions, ointments, or perfumes over your incision until it is well-healed.  You may use a hot tub or a pool after your wound check appointment if the incision is completely closed.  Do not lift, push or pull greater than 10 pounds with the affected arm until 6 weeks (August 22, 2023) after your procedure. There are no other restrictions in arm movement after your wound check appointment.  You may drive, unless driving has been restricted by your healthcare providers.  Remote monitoring is used to monitor your pacemaker from home. This monitoring is scheduled every 91 days by our office. It allows [place] to keep an eye on the functioning of your device to ensure it is working properly. You will routinely see your Electrophysiologist annually (more often if necessary).

## 2023-09-04 NOTE — Progress Notes (Signed)
 Cardiology Office Note   Date:  09/10/2023   ID:  Carlos Foster, DOB 20-Aug-1950, MRN [id number]  PCP:  Donnie Galea, MD  Cardiologist:   Kain Milosevic [place], MD   No chief complaint on file.     History of Present Illness: Carlos Foster is a 73 y.o. male who is seen for follow up bradycardia. He has a history of HTN, HLD and hypothyroidism. Bicycle stress test in 2017 was normal.   He reports that he had Covid 19 in June 2022. In July he noted his HR dropped consistently into the low 40s. Previously his resting HR would be in the 80s.  He feels that he gets to a point where he has to slow down- some fatigue and SOB. No dizziness, lightheadedness or syncope. Notes no chest pain.  In November 2022 we had him wear an event monitor that showed some benign PACs and junctional rhythm rate 50. Echo was normal. No significant bradycardia.   He was seen in April by Dr Vallarie Gauze with noted HR 40-50s. Repeat event monitor placed. Patient had HR down to 27 while asleep.   When last seen he was noted to have sinus brady- sinus node dysfunction. Noted chronotropic incompetence. Was seen by Dr Carolynne Citron and underwent Pacemaker placement on 07/11/23.   Since he had his pacer placed he has felt much better with increased energy. No complications.    Past Medical History:  Diagnosis Date   Arthritis    feet   GERD (gastroesophageal reflux disease)    History of hiatal hernia    Hyperlipidemia    Hypertension    Hypokalemia    Hypothyroidism    Kidney stones    Rosacea     Past Surgical History:  Procedure Laterality Date   ACHILLES TENDON SURGERY Left 06/01/2020   C2 C3 Laminectomy  07/09/2016   COLONOSCOPY     COLONOSCOPY  11/2019   ETT  04/15/03   wnl ST decreased < 1mm V6   EVACUATION OF CERVICAL HEMATOMA N/A 07/21/2016   Procedure: RE-EXPLORATION OF ANTERIOR CERVICAL WOUND AND EVACUATION OF CERVICAL HEMATOMA;  Surgeon: Gearl Keens, MD;  Location: [facility] OR;  Service: Neurosurgery;  Laterality:  N/A;   HAMMER TOE SURGERY Left 01/06/2016   Procedure: LEFT SECOND HAMMER TOE CORRECTION;  Surgeon: Amada Backer, MD;  Location: [facility];  Service: Orthopedics;  Laterality: Left;   hematoma removal     around larnyx   HEMORRHOID SURGERY  1982   HERNIA REPAIR Bilateral 2015   inguinal   LAMINECTOMY  12/02/99   C4/5, 5/6, 6/7 Fusion (Dr. Rica Chalet)   LUMBAR LAMINECTOMY/DECOMPRESSION MICRODISCECTOMY Left 03/02/2017   Procedure: Laminectomy and Foraminotomy - L2-L3 - L3-L4 - left;  Surgeon: Gearl Keens, MD;  Location: [facility];  Service: Neurosurgery;  Laterality: Left;   METATARSAL OSTEOTOMY Left 01/06/2016   Procedure: LEFT SECOND METATARSAL WEIL OSTEOTOMY;  Surgeon: Amada Backer, MD;  Location: [facility];  Service: Orthopedics;  Laterality: Left;   Morton's Neuroma Repair Right 1982   foot   PACEMAKER IMPLANT N/A 07/11/2023   Procedure: PACEMAKER IMPLANT;  Surgeon: Tammie Fall, MD;  [facility];  Service: Cardiovascular;  Laterality: N/A;   PROSTATE BIOPSY  12/10/06   Benign Inga Manges)     Current Outpatient Medications  Medication Sig Dispense Refill   amlodipine -atorvastatin  (CADUET ) 10-10 MG tablet Take 1 tablet by mouth daily. 90 tablet 3   aspirin EC 81 MG tablet  Take 81 mg by mouth daily.     doxazosin  (CARDURA ) 2 MG tablet TAKE ONE TABLET BY MOUTH ONCE DAILY 90 tablet 3   doxycycline  (VIBRAMYCIN ) 100 MG capsule Take 1 capsule (100 mg total) by mouth 2 (two) times daily as needed. For rosacea flare ups 60 capsule 3   levothyroxine  (SYNTHROID ) 150 MCG tablet TAKE 1 TABLET BY MOUTH ONCE A DAY BEFOREBREAKFAST 90 tablet 3   lisinopril  (ZESTRIL ) 40 MG tablet Take 1 tablet (40 mg total) by mouth daily. 90 tablet 3   metroNIDAZOLE  (METROGEL ) 1 % gel Apply 1 application  topically daily as needed for rosacea. 60 g 3   potassium chloride  SA (KLOR-CON  M) 20 MEQ tablet TAKE 2 TABLETS BY MOUTH TWICE A DAY 360 tablet 3   spironolactone  (ALDACTONE ) 25 MG  tablet Take 1 tablet (25 mg total) by mouth daily. 90 tablet 3   No current facility-administered medications for this visit.    Allergies:   Flexeril  [cyclobenzaprine ], Gabapentin, and Lyrica [pregabalin]    Social History:  The patient  reports that he has never smoked. He has never used smokeless tobacco. He reports that he does not drink alcohol  and does not use drugs.   Family History:  The patient's family history includes AAA (abdominal aortic aneurysm) in his father; Colon polyps in his father; Diabetes in his mother and another family member; Heart disease in his father and mother; Hypertension in his mother; Kidney disease in his mother; Lymphoma in his maternal uncle.    ROS:  Please see the history of present illness.   Otherwise, review of systems are positive for none.   All other systems are reviewed and negative.    PHYSICAL EXAM: VS:  BP (!) 146/80 (Cuff Size: Large)   Pulse (!) 111   Ht 6\' 1"  (1.854 m)   Wt 282 lb 6.4 oz (128.1 kg)   SpO2 96%   BMI 37.26 kg/m  , BMI Body mass index is 37.26 kg/m. GEN: Well nourished, overweight, in no acute distress HEENT: normal Neck: no JVD, carotid bruits, or masses Cardiac: RRR; no murmurs, rubs, or gallops,no edema  Respiratory:  clear to auscultation bilaterally, normal work of breathing GI: soft, nontender, nondistended, + BS MS: no deformity or atrophy Skin: warm and dry, no rash Neuro:  Strength and sensation are intact Psych: euthymic mood, full affect      Recent Labs: 11/06/2022: ALT 20; TSH 1.010 06/25/2023: BUN 18; Creatinine, Ser 1.00; Hemoglobin 13.5; Platelets 156; Potassium 4.5; Sodium 146    Lipid Panel    Component Value Date/Time   CHOL 111 11/06/2022 0847   CHOL 145 05/12/2016 0000   TRIG 72 11/06/2022 0847   TRIG 101 05/12/2016 0000   HDL 39 (L) 11/06/2022 0847   HDL 39 05/12/2016 0000   CHOLHDL 2.8 11/06/2022 0847   CHOLHDL 3 11/02/2020 0851   VLDL 26.2 11/02/2020 0851   LDLCALC 57  11/06/2022 0847   LDLCALC 86 05/12/2016 0000      Wt Readings from Last 3 Encounters:  09/10/23 282 lb 6.4 oz (128.1 kg)  07/11/23 280 lb (127 kg)  05/31/23 277 lb 12.8 oz (126 kg)    Sleep Apnea Evaluation  [facility]  Today's Date: 09/10/2023   Patient Name: Carlos Foster        DOB: 1950-10-07       Height:  6\' 1"  (1.854 m)     Weight: 282 lb 6.4 oz (128.1 kg)  BMI: Body mass index is 37.26 kg/m.     STOP-BANG RISK ASSESSMENT       11/30/2022    4:01 PM  STOP-BANG  Do you snore loudly? Yes  Do you often feel tired, fatigued, or sleepy during the daytime? Yes  Has anyone observed you stop breathing during sleep? Yes  Do you have (or are you being treated for) high blood pressure? Yes  Recent BMI (Calculated) 37.35  Is BMI greater than 35 kg/m2? 1=Yes  Age older than 71 years old? 1=Yes  Has large neck size > 40 cm (15.7 in, large male shirt size, large male collar size > 16) Yes  Gender - Male 1=Yes  STOP-Bang Total Score 8      If STOP-BANG Score >=3 OR two clinical symptoms - patient qualifies for WatchPAT (CPT 95800)      Sleep study ordered due to two (2) of the following clinical symptoms/diagnoses:  Excessive daytime sleepiness G47.10  Gastroesophageal reflux K21.9  Nocturia R35.1  Morning Headaches G44.221  Difficulty concentrating R41.840  Memory problems or poor judgment G31.84  Personality changes or irritability R45.4  Loud snoring R06.83  Depression F32.9  Unrefreshed by sleep G47.8  Impotence N52.9  History of high blood pressure R03.0  Insomnia G47.00  Sleep Disordered Breathing or Sleep Apnea ICD G47.33      Other studies Reviewed: Additional studies/ records that were reviewed today include:   Event monitor 06/27/21: Study Highlights    Normal sinus rhythm rate 33-132. mean 64 bpm Occasional PACs with few brief runs of PACs. Junctional rhythm with rate in the 50s. Rare PVCs     Patch Wear Time:  3 days and  0 hours (2022-11-14T21:17:39-0500 to 2022-11-17T22:14:16-0500)   Patient had a min HR of 33 bpm, max HR of 132 bpm, and avg HR of 64 bpm. Predominant underlying rhythm was Sinus Rhythm. 15 Supraventricular Tachycardia runs occurred, the run with the fastest interval lasting 4 beats with a max rate of 132 bpm, the  longest lasting 11 beats with an avg rate of 102 bpm. Junctional Rhythm was present. Junctional Rhythm was detected within +/- 45 seconds of symptomatic patient event(s). Isolated SVEs were occasional (3.9%, 10720), SVE Couplets were occasional (1.6%,  2209), and SVE Triplets were rare (<1.0%, 790). Isolated VEs were rare (<1.0%, 420), VE Couplets were rare (<1.0%, 4), and VE Triplets were rare (<1.0%, 1). Ventricular Bigeminy and Trigeminy were present.     Echo 07/05/21: IMPRESSIONS     1. Left ventricular ejection fraction, by estimation, is 60 to 65%. The  left ventricle has normal function. The left ventricle has no regional  wall motion abnormalities. There is mild left ventricular hypertrophy.  Left ventricular diastolic parameters  are consistent with Grade I diastolic dysfunction (impaired relaxation).  The average left ventricular global longitudinal strain is -22.8 %. The  global longitudinal strain is normal.   2. Right ventricular systolic function is normal. The right ventricular  size is normal.   3. The mitral valve is normal in structure. Trivial mitral valve  regurgitation. No evidence of mitral stenosis.   4. The aortic valve is tricuspid. Aortic valve regurgitation is not  visualized. No aortic stenosis is present.   5. Aortic dilatation noted. There is borderline dilatation of the aortic  root, measuring 39 mm.   6. The inferior vena cava is normal in size with greater than 50%  respiratory variability, suggesting right atrial pressure of 3 mmHg.   Comparison(s): No prior Echocardiogram.  ASSESSMENT AND PLAN:  1.  Sinus node dysfunction/chronotropic  incompetence. Prior sleep study without OSA.  now s/p pacemaker insertion with improved symptoms. Follow up with EP 2 .HTN-  On amlodipine  10 mg, lisinopril  40 mg and aldactone  25 mg daily. Also on Cardura  2 mg qhs. Continue current therapy 3. HLD. On statin 4. Hypothyroidism. TSH normal on replacement.   Current medicines are reviewed at length with the patient today.dThe patient does not have concerns regarding medicines.  The following changes have been made:  no change  Labs/ tests ordered today include:   No orders of the defined types were placed in this encounter.  Follow up in one year Signed, Lorisa Scheid [place], MD  09/10/2023 2:49 PM    [facility] [address] [place], [place], [address] Phone [phone], Fax [phone]

## 2023-09-10 ENCOUNTER — Encounter: Payer: Self-pay | Admitting: Cardiology

## 2023-09-10 ENCOUNTER — Ambulatory Visit: Payer: Medicare Other | Attending: Cardiology | Admitting: Cardiology

## 2023-09-10 VITALS — BP 146/80 | HR 111 | Ht 73.0 in | Wt 282.4 lb

## 2023-09-10 DIAGNOSIS — E7800 Pure hypercholesterolemia, unspecified: Secondary | ICD-10-CM | POA: Diagnosis not present

## 2023-09-10 DIAGNOSIS — R001 Bradycardia, unspecified: Secondary | ICD-10-CM | POA: Diagnosis not present

## 2023-09-10 DIAGNOSIS — I495 Sick sinus syndrome: Secondary | ICD-10-CM | POA: Diagnosis not present

## 2023-09-10 DIAGNOSIS — I10 Essential (primary) hypertension: Secondary | ICD-10-CM | POA: Insufficient documentation

## 2023-09-10 NOTE — Patient Instructions (Signed)
 Medication Instructions:  Continue same medications *If you need a refill on your cardiac medications before your next appointment, please call your pharmacy*   Lab Work: None ordered   Testing/Procedures: None ordered   Follow-Up: At [facility], you and your health needs are our priority.  As part of our continuing mission to provide you with exceptional heart care, we have created designated Provider Care Teams.  These Care Teams include your primary Cardiologist (physician) and Advanced Practice Providers (APPs -  Physician Assistants and Nurse Practitioners) who all work together to provide you with the care you need, when you need it.  We recommend signing up for the patient portal called "MyChart".  Sign up information is provided on this After Visit Summary.  MyChart is used to connect with patients for Virtual Visits (Telemedicine).  Patients are able to view lab/test results, encounter notes, upcoming appointments, etc.  Non-urgent messages can be sent to your provider as well.   To learn more about what you can do with MyChart, go to [email or web address].    Your next appointment:  1 year    Call in Oct to schedule Feb appointment     Provider: Dr.Jordan

## 2023-10-09 ENCOUNTER — Encounter: Payer: Self-pay | Admitting: Internal Medicine

## 2023-10-09 ENCOUNTER — Ambulatory Visit: Payer: BLUE CROSS/BLUE SHIELD | Attending: Internal Medicine | Admitting: Internal Medicine

## 2023-10-09 VITALS — BP 144/82 | HR 74 | Ht 73.0 in | Wt 286.0 lb

## 2023-10-09 DIAGNOSIS — R001 Bradycardia, unspecified: Secondary | ICD-10-CM | POA: Insufficient documentation

## 2023-10-09 DIAGNOSIS — I495 Sick sinus syndrome: Secondary | ICD-10-CM | POA: Diagnosis not present

## 2023-10-09 LAB — CUP PACEART INCLINIC DEVICE CHECK
Date Time Interrogation Session: 20250311090802
Implantable Lead Connection Status: 753985
Implantable Lead Connection Status: 753985
Implantable Lead Implant Date: 20241211
Implantable Lead Implant Date: 20241211
Implantable Lead Location: 753859
Implantable Lead Location: 753860
Implantable Lead Model: 377
Implantable Lead Model: 377
Implantable Lead Serial Number: 8001707582
Implantable Lead Serial Number: 8001725483
Implantable Pulse Generator Implant Date: 20241211
Pulse Gen Serial Number: 1000336117

## 2023-10-09 NOTE — Progress Notes (Signed)
 HPI [name] returns for ongoing evaluation of sinus node dysfunction. He is a pleasant 73 yo man with a h/o HTN, who has been bothered by fatigue. He has dyslipidemia and hypothyroidism. He has a h/o junctional rhythm over a year ago but no long pauses. He developed bradycardia initially after Covid. He has not had syncope.Since I saw him last he has had progressively worsening trouble with exertion. He could not get his HR above 85/min and then has to stop and rest. No syncope. He runs in the 40's at baseline. Since his PPM insertion he is improved. He admits to being sedentary.  Allergies  Allergen Reactions   Flexeril [Cyclobenzaprine]     Gumline irritation/pulse irregularity   Gabapentin Other (See Comments)    Leg swelling   Lyrica [Pregabalin]     Leg swelling     Current Outpatient Medications  Medication Sig Dispense Refill   amlodipine-atorvastatin (CADUET) 10-10 MG tablet Take 1 tablet by mouth daily. 90 tablet 3   aspirin EC 81 MG tablet Take 81 mg by mouth daily.     doxazosin (CARDURA) 2 MG tablet TAKE ONE TABLET BY MOUTH ONCE DAILY 90 tablet 3   doxycycline (VIBRAMYCIN) 100 MG capsule Take 1 capsule (100 mg total) by mouth 2 (two) times daily as needed. For rosacea flare ups 60 capsule 3   levothyroxine (SYNTHROID) 150 MCG tablet TAKE 1 TABLET BY MOUTH ONCE A DAY BEFOREBREAKFAST 90 tablet 3   lisinopril (ZESTRIL) 40 MG tablet Take 1 tablet (40 mg total) by mouth daily. 90 tablet 3   metroNIDAZOLE (METROGEL) 1 % gel Apply 1 application  topically daily as needed for rosacea. 60 g 3   potassium chloride SA (KLOR-CON M) 20 MEQ tablet TAKE 2 TABLETS BY MOUTH TWICE A DAY 360 tablet 3   spironolactone (ALDACTONE) 25 MG tablet Take 1 tablet (25 mg total) by mouth daily. 90 tablet 3   No current facility-administered medications for this visit.     Past Medical History:  Diagnosis Date   Arthritis    feet   GERD (gastroesophageal reflux disease)    History of  hiatal hernia    Hyperlipidemia    Hypertension    Hypokalemia    Hypothyroidism    Kidney stones    Rosacea     ROS:   All systems reviewed and negative except as noted in the HPI.   Past Surgical History:  Procedure Laterality Date   ACHILLES TENDON SURGERY Left 06/01/2020   C2 C3 Laminectomy  07/09/2016   COLONOSCOPY     COLONOSCOPY  11/2019   ETT  04/15/03   wnl ST decreased < 1mm V6   EVACUATION OF CERVICAL HEMATOMA N/A 07/21/2016   Procedure: RE-EXPLORATION OF ANTERIOR CERVICAL WOUND AND EVACUATION OF CERVICAL HEMATOMA;  Surgeon: Donalee Citrin, MD;  Location: [facility] OR;  Service: Neurosurgery;  Laterality: N/A;   HAMMER TOE SURGERY Left 01/06/2016   Procedure: LEFT SECOND HAMMER TOE CORRECTION;  Surgeon: Toni Arthurs, MD;  Location: [facility];  Service: Orthopedics;  Laterality: Left;   hematoma removal     around larnyx   HEMORRHOID SURGERY  1982   HERNIA REPAIR Bilateral 2015   inguinal   LAMINECTOMY  12/02/99   C4/5, 5/6, 6/7 Fusion (Dr. Jeral Fruit)   LUMBAR LAMINECTOMY/DECOMPRESSION MICRODISCECTOMY Left 03/02/2017   Procedure: Laminectomy and Foraminotomy - L2-L3 - L3-L4 - left;  Surgeon: Donalee Citrin, MD;  Location: [facility] OR;  Service: Neurosurgery;  Laterality: Left;   METATARSAL OSTEOTOMY Left 01/06/2016   Procedure: LEFT SECOND METATARSAL WEIL OSTEOTOMY;  Surgeon: Toni Arthurs, MD;  Location: [facility];  Service: Orthopedics;  Laterality: Left;   Morton's Neuroma Repair Right 1982   foot   PACEMAKER IMPLANT N/A 07/11/2023   Procedure: PACEMAKER IMPLANT;  Surgeon: Marinus Maw, MD;  Location: [facility];  Service: Cardiovascular;  Laterality: N/A;   PROSTATE BIOPSY  12/10/06   Benign Annabell Howells)     Family History  Problem Relation Age of Onset   Kidney disease Mother        Acute kidney failure   Heart disease Mother        CAD   Hypertension Mother    Diabetes Mother        DM and diabetic retinopathy, toe amputation   Diabetes Other     Heart disease Father    AAA (abdominal aortic aneurysm) Father    Colon polyps Father    Lymphoma Maternal Uncle        X 2   Depression Neg Hx    Alcohol abuse Neg Hx    Drug abuse Neg Hx    Stroke Neg Hx    Prostate cancer Neg Hx    Colon cancer Neg Hx    Esophageal cancer Neg Hx    Stomach cancer Neg Hx    Rectal cancer Neg Hx      Social History   Socioeconomic History   Marital status: Married    Spouse name: Not on file   Number of children: 1   Years of education: Not on file   Highest education level: Not on file  Occupational History   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   Laid off from work 2012   Engineer, structural for [employer]   Social Drivers of [employer]: Low Risk  (11/07/2021)   Overall Financial Resource Strain (CARDIA)    Difficulty of Paying Living Expenses: Not hard at all  Food Insecurity: No Food Insecurity (11/07/2021)   Hunger Vital Sign    Worried About Running Out of Food in the Last Year: Never true    Ran Out of Food in the Last Year: Never true  Transportation Needs: No Transportation Needs (11/07/2021)   PRAPARE - Administrator, Civil Service (Medical): No    Lack of Transportation (Non-Medical): No  Physical Activity: Sufficiently Active (11/07/2021)   Exercise Vital Sign    Days of Exercise per Week: 3 days    Minutes of Exercise per Session: 60 min  Stress: No Stress Concern Present (11/07/2021)   [employer] of Occupational Health -  Occupational Stress Questionnaire    Feeling of Stress : Not at all  Social Connections: Moderately Integrated (11/07/2021)   Social Connection and Isolation Panel [NHANES]    Frequency of Communication with Friends and Family: More than three times a  week    Frequency of Social Gatherings with Friends and Family: More than three times a week    Attends Religious Services: Never    Database administrator or Organizations: Yes    Attends Engineer, structural: More than 4 times per year    Marital Status: Married  Catering manager Violence: Not At Risk (11/07/2021)   Humiliation, Afraid, Rape, and Kick questionnaire    Fear of Current or Ex-Partner: No    Emotionally Abused: No    Physically Abused: No    Sexually Abused: No     BP (!) 144/82   Pulse 74   Ht 6\' 1"  (1.854 m)   Wt 286 lb (129.7 kg)   SpO2 98%   BMI 37.73 kg/m   Physical Exam:  Well appearing 73 yo man, NAD HEENT: Unremarkable Neck:  No JVD, no thyromegally Lymphatics:  No adenopathy Back:  No CVA tenderness Lungs:  Clear with no wheezes HEART:  Regular rate rhythm, no murmurs, no rubs, no clicks Abd:  soft, positive bowel sounds, no organomegally, no rebound, no guarding Ext:  2 plus pulses, no edema, no cyanosis, no clubbing Skin:  No rashes no nodules Neuro:  CN II through XII intact, motor grossly intact  EKG - nsr with atrial pacing  DEVICE  Normal device function.  See PaceArt for details.   Assess/Plan: Sinus node dysfunction - he is improved s/p PPM insertion. HTN - his bp is stable today. He is encouraged to lose weight and avoid salty foods and exercise.  PPM -his Biotronik DDD PM is working normally. We will recheck in several months.  Sharlot Gowda Ramey [place]cherside,MD

## 2023-10-09 NOTE — Patient Instructions (Signed)

## 2023-10-11 ENCOUNTER — Ambulatory Visit (INDEPENDENT_AMBULATORY_CARE_PROVIDER_SITE_OTHER): Payer: BLUE CROSS/BLUE SHIELD

## 2023-10-11 ENCOUNTER — Encounter: Payer: Self-pay | Admitting: Internal Medicine

## 2023-10-11 DIAGNOSIS — I495 Sick sinus syndrome: Secondary | ICD-10-CM

## 2023-10-11 LAB — CUP PACEART REMOTE DEVICE CHECK
Battery Voltage: 100 V
Date Time Interrogation Session: 20250313081356
Implantable Lead Connection Status: 753985
Implantable Lead Connection Status: 753985
Implantable Lead Implant Date: 20241211
Implantable Lead Implant Date: 20241211
Implantable Lead Location: 753859
Implantable Lead Location: 753860
Implantable Lead Model: 377
Implantable Lead Model: 377
Implantable Lead Serial Number: 8001707582
Implantable Lead Serial Number: 8001725483
Implantable Pulse Generator Implant Date: 20241211
Pulse Gen Serial Number: 1000336117

## 2023-11-11 ENCOUNTER — Other Ambulatory Visit: Payer: Self-pay | Admitting: Family Medicine

## 2023-11-11 ENCOUNTER — Telehealth: Payer: Self-pay | Admitting: Family Medicine

## 2023-11-11 DIAGNOSIS — Z125 Encounter for screening for malignant neoplasm of prostate: Secondary | ICD-10-CM

## 2023-11-11 DIAGNOSIS — I77811 Abdominal aortic ectasia: Secondary | ICD-10-CM

## 2023-11-11 DIAGNOSIS — I10 Essential (primary) hypertension: Secondary | ICD-10-CM

## 2023-11-11 DIAGNOSIS — E038 Other specified hypothyroidism: Secondary | ICD-10-CM

## 2023-11-11 NOTE — Telephone Encounter (Deleted)
-----   Message from Richrd Char sent at 11/17/2021  9:18 AM EDT ----- Regarding: Need to recheck abdominal aorta ultrasound, 2-year follow-up

## 2023-11-11 NOTE — Telephone Encounter (Signed)
 Please contact patient, due for recheck abdominal aorta ultrasound, 2-year follow-up.  I put in the order.  He should get called about scheduling.  Thanks.

## 2023-11-11 NOTE — Addendum Note (Signed)
 Addended by: Donnie Galea on: 11/11/2023 10:46 PM   Modules accepted: Orders

## 2023-11-12 ENCOUNTER — Encounter: Payer: Self-pay | Admitting: Family Medicine

## 2023-11-13 NOTE — Telephone Encounter (Signed)
 Patient would like his referral to be placed to [facility] and not [facility]

## 2023-11-14 NOTE — Telephone Encounter (Signed)
 Patient notified and he has been scheduled for 12/13/23

## 2023-11-15 ENCOUNTER — Other Ambulatory Visit: Payer: Self-pay | Admitting: Family Medicine

## 2023-11-15 DIAGNOSIS — E038 Other specified hypothyroidism: Secondary | ICD-10-CM

## 2023-11-16 ENCOUNTER — Other Ambulatory Visit: Payer: BLUE CROSS/BLUE SHIELD

## 2023-11-18 NOTE — Telephone Encounter (Signed)
 Noted. Thanks.

## 2023-11-19 ENCOUNTER — Other Ambulatory Visit (INDEPENDENT_AMBULATORY_CARE_PROVIDER_SITE_OTHER)

## 2023-11-19 DIAGNOSIS — E038 Other specified hypothyroidism: Secondary | ICD-10-CM

## 2023-11-19 DIAGNOSIS — I10 Essential (primary) hypertension: Secondary | ICD-10-CM | POA: Diagnosis not present

## 2023-11-19 DIAGNOSIS — Z125 Encounter for screening for malignant neoplasm of prostate: Secondary | ICD-10-CM | POA: Diagnosis not present

## 2023-11-20 LAB — CBC WITH DIFFERENTIAL/PLATELET
Basophils Absolute: 0 10*3/uL (ref 0.0–0.2)
Basos: 0 %
EOS (ABSOLUTE): 0.1 10*3/uL (ref 0.0–0.4)
Eos: 2 %
Hematocrit: 41.6 % (ref 37.5–51.0)
Hemoglobin: 14.1 g/dL (ref 13.0–17.7)
Immature Grans (Abs): 0 10*3/uL (ref 0.0–0.1)
Immature Granulocytes: 0 %
Lymphocytes Absolute: 1.5 10*3/uL (ref 0.7–3.1)
Lymphs: 32 %
MCH: 30.8 pg (ref 26.6–33.0)
MCHC: 33.9 g/dL (ref 31.5–35.7)
MCV: 91 fL (ref 79–97)
Monocytes Absolute: 0.5 10*3/uL (ref 0.1–0.9)
Monocytes: 10 %
Neutrophils Absolute: 2.6 10*3/uL (ref 1.4–7.0)
Neutrophils: 56 %
Platelets: 197 10*3/uL (ref 150–450)
RBC: 4.58 x10E6/uL (ref 4.14–5.80)
RDW: 13.2 % (ref 11.6–15.4)
WBC: 4.7 10*3/uL (ref 3.4–10.8)

## 2023-11-20 LAB — LIPID PANEL
Chol/HDL Ratio: 3.5 ratio (ref 0.0–5.0)
Cholesterol, Total: 126 mg/dL (ref 100–199)
HDL: 36 mg/dL — ABNORMAL LOW (ref >39)
LDL Chol Calc (NIH): 69 mg/dL (ref 0–99)
Triglycerides: 117 mg/dL (ref 0–149)
VLDL Cholesterol Cal: 21 mg/dL (ref 5–40)

## 2023-11-20 LAB — COMPREHENSIVE METABOLIC PANEL WITH GFR
ALT: 17 IU/L (ref 0–44)
AST: 17 IU/L (ref 0–40)
Albumin: 4.2 g/dL (ref 3.9–4.9)
Alkaline Phosphatase: 87 IU/L (ref 44–121)
BUN/Creatinine Ratio: 15 (ref 10–24)
BUN: 16 mg/dL (ref 8–27)
Bilirubin Total: 0.7 mg/dL (ref 0.0–1.2)
CO2: 23 mmol/L (ref 20–29)
Calcium: 8.6 mg/dL (ref 8.6–10.2)
Chloride: 106 mmol/L (ref 96–106)
Creatinine, Ser: 1.06 mg/dL (ref 0.76–1.27)
Globulin, Total: 2.3 g/dL (ref 1.5–4.5)
Glucose: 111 mg/dL — ABNORMAL HIGH (ref 70–99)
Potassium: 3.2 mmol/L — ABNORMAL LOW (ref 3.5–5.2)
Sodium: 144 mmol/L (ref 134–144)
Total Protein: 6.5 g/dL (ref 6.0–8.5)
eGFR: 75 mL/min/{1.73_m2} (ref >59)

## 2023-11-20 LAB — TSH: TSH: 2.4 u[IU]/mL (ref 0.450–4.500)

## 2023-11-20 LAB — PSA: Prostate Specific Ag, Serum: 5 ng/mL — ABNORMAL HIGH (ref 0.0–4.0)

## 2023-11-22 NOTE — Progress Notes (Signed)
 Remote pacemaker transmission.

## 2023-11-23 ENCOUNTER — Ambulatory Visit (INDEPENDENT_AMBULATORY_CARE_PROVIDER_SITE_OTHER): Payer: BLUE CROSS/BLUE SHIELD | Admitting: Family Medicine

## 2023-11-23 ENCOUNTER — Encounter: Payer: Self-pay | Admitting: Family Medicine

## 2023-11-23 VITALS — BP 142/82 | HR 81 | Temp 97.9°F | Ht 72.2 in | Wt 282.4 lb

## 2023-11-23 DIAGNOSIS — E876 Hypokalemia: Secondary | ICD-10-CM

## 2023-11-23 DIAGNOSIS — I77811 Abdominal aortic ectasia: Secondary | ICD-10-CM | POA: Diagnosis not present

## 2023-11-23 DIAGNOSIS — I10 Essential (primary) hypertension: Secondary | ICD-10-CM | POA: Diagnosis not present

## 2023-11-23 DIAGNOSIS — E038 Other specified hypothyroidism: Secondary | ICD-10-CM

## 2023-11-23 DIAGNOSIS — Z0000 Encounter for general adult medical examination without abnormal findings: Secondary | ICD-10-CM

## 2023-11-23 DIAGNOSIS — E785 Hyperlipidemia, unspecified: Secondary | ICD-10-CM

## 2023-11-23 DIAGNOSIS — Z7189 Other specified counseling: Secondary | ICD-10-CM

## 2023-11-23 MED ORDER — POTASSIUM CHLORIDE CRYS ER 20 MEQ PO TBCR: 40.0000 meq | EXTENDED_RELEASE_TABLET | Freq: Two times a day (BID) | ORAL | 3 refills | Status: DC

## 2023-11-23 MED ORDER — ATORVASTATIN CALCIUM 10 MG PO TABS: 10.0000 mg | ORAL_TABLET | Freq: Every day | ORAL | 3 refills | Status: AC

## 2023-11-23 MED ORDER — SPIRONOLACTONE 25 MG PO TABS: 25.0000 mg | ORAL_TABLET | Freq: Every day | ORAL | 3 refills | Status: AC

## 2023-11-23 MED ORDER — AMLODIPINE BESYLATE 10 MG PO TABS: 10.0000 mg | ORAL_TABLET | Freq: Every day | ORAL | 3 refills | Status: AC

## 2023-11-23 MED ORDER — LISINOPRIL 40 MG PO TABS: 40.0000 mg | ORAL_TABLET | Freq: Every day | ORAL | 3 refills | Status: AC

## 2023-11-23 MED ORDER — LEVOTHYROXINE SODIUM 150 MCG PO TABS: ORAL_TABLET | ORAL | 3 refills | Status: AC

## 2023-11-23 NOTE — Telephone Encounter (Signed)
 Referral faxed to [facility] imaging in [place].  MyChart message sent to patient.

## 2023-11-23 NOTE — Patient Instructions (Addendum)
 Shingles/RSV/Tetanus shot at the pharmacy.   Go to the lab on the way out.   If you have mychart we'll likely use that to update you.    Take care.  Glad to see you.

## 2023-11-23 NOTE — Progress Notes (Signed)
 Flu previously done Shingles discussed with patient PNA previously done Tetanus 2015 COVID-vaccine previously done.   RSV discussed with patient. Colonoscopy 2022 Prostate cancer screening 2025 Advance directive-wife designated if patient were incapacitated.  L lower back was sore after working out on the elliptical.  Better in the meantime and he'll update me as needed.     He is going to f/u with urology.  Discussed.  Hypothyroidism.  TSH wnl.  Compliant.  No ADE on med.   He has aorta u/s pending.   Hypertension:    Using medication without problems or lightheadedness: yes Chest pain with exertion:no Edema:no Short of breath:no He feels better after pacer placement.   Labs d/w pt. history of chronic hypokalemia on replacement.  Elevated Cholesterol: Using medications without problems: yes Muscle aches: no Diet compliance: yes Exercise: yes He needed caduet  changed to separate meds due to cost, d/w pt.    Meds, vitals, and allergies reviewed.   ROS: Per HPI unless specifically indicated in ROS section   GEN: nad, alert and oriented HEENT: mucous membranes moist NECK: supple w/o LA CV: rrr.  PULM: ctab, no inc wob ABD: soft, +bs EXT: no edema SKIN: well perfused.

## 2023-11-24 LAB — POTASSIUM: Potassium: 3.1 mmol/L — ABNORMAL LOW (ref 3.5–5.2)

## 2023-11-25 ENCOUNTER — Encounter: Payer: Self-pay | Admitting: Family Medicine

## 2023-11-25 ENCOUNTER — Other Ambulatory Visit: Payer: Self-pay | Admitting: Family Medicine

## 2023-11-25 DIAGNOSIS — E876 Hypokalemia: Secondary | ICD-10-CM

## 2023-11-25 MED ORDER — POTASSIUM CHLORIDE CRYS ER 20 MEQ PO TBCR
EXTENDED_RELEASE_TABLET | ORAL | Status: DC
Start: 2023-11-25 — End: 2023-12-19

## 2023-11-25 NOTE — Assessment & Plan Note (Signed)
 TSH wnl.  Compliant.  No ADE on med.  Continue levothyroxine  as is.

## 2023-11-25 NOTE — Assessment & Plan Note (Signed)
 Flu previously done Shingles discussed with patient PNA previously done Tetanus 2015 COVID-vaccine previously done.   RSV discussed with patient. Colonoscopy 2022 Prostate cancer screening 2025 Advance directive-wife designated if patient were incapacitated.

## 2023-11-25 NOTE — Assessment & Plan Note (Signed)
 He needed caduet  changed to separate meds due to cost, d/w pt. no change in atorvastatin  component, 10 mg.

## 2023-11-25 NOTE — Assessment & Plan Note (Signed)
 He feels better after patient placement.  Continue amlodipine  doxazosin  lisinopril  potassium spironolactone .  Labs discussed with patient.

## 2023-11-25 NOTE — Assessment & Plan Note (Signed)
 He has aorta u/s pending.

## 2023-11-25 NOTE — Assessment & Plan Note (Signed)
 Advance directive- wife designated if patient were incapacitated.

## 2023-11-25 NOTE — Assessment & Plan Note (Signed)
 History of.  Chronic issue.  See notes on follow-up labs.

## 2023-11-28 DIAGNOSIS — R972 Elevated prostate specific antigen [PSA]: Secondary | ICD-10-CM | POA: Diagnosis not present

## 2023-12-05 DIAGNOSIS — N401 Enlarged prostate with lower urinary tract symptoms: Secondary | ICD-10-CM | POA: Diagnosis not present

## 2023-12-05 DIAGNOSIS — R351 Nocturia: Secondary | ICD-10-CM | POA: Diagnosis not present

## 2023-12-05 DIAGNOSIS — R3912 Poor urinary stream: Secondary | ICD-10-CM | POA: Diagnosis not present

## 2023-12-05 DIAGNOSIS — R972 Elevated prostate specific antigen [PSA]: Secondary | ICD-10-CM | POA: Diagnosis not present

## 2023-12-05 DIAGNOSIS — R3914 Feeling of incomplete bladder emptying: Secondary | ICD-10-CM | POA: Diagnosis not present

## 2023-12-10 ENCOUNTER — Other Ambulatory Visit (INDEPENDENT_AMBULATORY_CARE_PROVIDER_SITE_OTHER)

## 2023-12-10 DIAGNOSIS — E876 Hypokalemia: Secondary | ICD-10-CM

## 2023-12-10 NOTE — Addendum Note (Signed)
 Addended by: Bernadene Brewer on: 12/10/2023 08:04 AM   Modules accepted: Orders

## 2023-12-11 LAB — BASIC METABOLIC PANEL WITH GFR
BUN/Creatinine Ratio: 9 — ABNORMAL LOW (ref 10–24)
BUN: 10 mg/dL (ref 8–27)
CO2: 23 mmol/L (ref 20–29)
Calcium: 9 mg/dL (ref 8.6–10.2)
Chloride: 104 mmol/L (ref 96–106)
Creatinine, Ser: 1.14 mg/dL (ref 0.76–1.27)
Glucose: 108 mg/dL — ABNORMAL HIGH (ref 70–99)
Potassium: 3.3 mmol/L — ABNORMAL LOW (ref 3.5–5.2)
Sodium: 143 mmol/L (ref 134–144)
eGFR: 69 mL/min/{1.73_m2} (ref >59)

## 2023-12-14 ENCOUNTER — Ambulatory Visit (HOSPITAL_COMMUNITY)
Admission: RE | Admit: 2023-12-14 | Discharge: 2023-12-14 | Disposition: A | Discharge status: Home / Self Care | Source: Ambulatory Visit | Attending: Family Medicine | Admitting: Family Medicine

## 2023-12-14 DIAGNOSIS — I77811 Abdominal aortic ectasia: Secondary | ICD-10-CM | POA: Diagnosis not present

## 2023-12-16 ENCOUNTER — Ambulatory Visit: Payer: Self-pay | Admitting: Family Medicine

## 2023-12-17 ENCOUNTER — Ambulatory Visit: Payer: Self-pay | Admitting: Family Medicine

## 2023-12-19 ENCOUNTER — Other Ambulatory Visit: Payer: Self-pay | Admitting: Family Medicine

## 2023-12-19 DIAGNOSIS — E876 Hypokalemia: Secondary | ICD-10-CM

## 2023-12-19 MED ORDER — POTASSIUM CHLORIDE CRYS ER 20 MEQ PO TBCR: EXTENDED_RELEASE_TABLET | ORAL | 3 refills | Status: AC

## 2024-01-08 ENCOUNTER — Telehealth: Payer: Self-pay

## 2024-01-08 NOTE — Telephone Encounter (Signed)
 Spoke with patient.  He is coming to device clinic tomorrow for us  to evaluate.

## 2024-01-08 NOTE — Telephone Encounter (Signed)
 RA pacing threshold above limit (> 2.0 V) Last value 2.9 V measured on Jan 08, 2024, 1:37:02 AM   Patient has SSS and AP 86% of the time.  Need to evaluate lead function.

## 2024-01-09 ENCOUNTER — Ambulatory Visit: Attending: Cardiovascular Disease

## 2024-01-09 DIAGNOSIS — I495 Sick sinus syndrome: Secondary | ICD-10-CM

## 2024-01-09 NOTE — Progress Notes (Signed)
 Patient brought in today due to sudden trend in past week on RA lead.  New dual chamber PPM implant on 07/11/23. RA pacing threshold has suddenly risen from 1.0V average to 2.9V at 0.75ms per trend.  Given patient has SSS and AP 86%, brought in to evaluate. Sensing and Impedances are normal.  Patient denies any accidents or chest trauma events in recent weeks.  Ra threshold testing today shows best improvement with output with a 1.44ms pulse width. (1.5@1 .0ms).  Reviewed with Pavan from [employer] and Dr. Arlester Ladd.  Recommenations to continue monitoring with remotes, no chest xray necessary at this point and will send phone message to Dr. Carolynne Citron making him aware.     PROGRAMMING CHANGE: Adjustment made to programmed threshold to extend pw from 0.13ms to 1.45ms.  Cap Confirm is still on.

## 2024-01-09 NOTE — Telephone Encounter (Signed)
 Patient brought in today due to sudden trend in past week on RA lead.  New dual chamber PPM implant on 07/11/23. RA pacing threshold has suddenly risen from 1.0V average to 2.9V at 0.75ms per trend.  Given patient has SSS and AP 86%, brought in to evaluate. Sensing and Impedances are normal.  Patient denies any accidents or chest trauma events in recent weeks.  Ra threshold testing today shows best improvement with output with a 1.44ms pulse width. (1.5@1 .0ms).  Reviewed with Pavan from [employer] and Dr. Arlester Ladd.  Recommenations to continue monitoring with remotes, no chest xray necessary at this point and will send phone message to Dr. Carolynne Citron making him aware.     PROGRAMMING CHANGE: Adjustment made to programmed threshold to extend pw from 0.13ms to 1.45ms.  Cap Confirm is still on.

## 2024-01-10 ENCOUNTER — Ambulatory Visit (INDEPENDENT_AMBULATORY_CARE_PROVIDER_SITE_OTHER): Payer: BLUE CROSS/BLUE SHIELD

## 2024-01-10 DIAGNOSIS — R001 Bradycardia, unspecified: Secondary | ICD-10-CM | POA: Diagnosis not present

## 2024-01-11 LAB — CUP PACEART REMOTE DEVICE CHECK
Date Time Interrogation Session: 20250612143541
Implantable Lead Connection Status: 753985
Implantable Lead Connection Status: 753985
Implantable Lead Implant Date: 20241211
Implantable Lead Implant Date: 20241211
Implantable Lead Location: 753859
Implantable Lead Location: 753860
Implantable Lead Model: 377
Implantable Lead Model: 377
Implantable Lead Serial Number: 8001707582
Implantable Lead Serial Number: 8001725483
Implantable Pulse Generator Implant Date: 20241211
Pulse Gen Serial Number: 1000336117

## 2024-01-13 ENCOUNTER — Ambulatory Visit: Payer: Self-pay | Admitting: Internal Medicine

## 2024-01-14 ENCOUNTER — Ambulatory Visit: Payer: Self-pay | Admitting: Family Medicine

## 2024-01-14 NOTE — Telephone Encounter (Signed)
Will see at OV.  Thanks.  

## 2024-01-14 NOTE — Telephone Encounter (Signed)
 FYI Only or Action Required?: FYI only for provider  Patient was last seen in primary care on 11/23/2023 by Donnie Galea, MD. Called Nurse Triage reporting Leg Pain. Symptoms began several weeks ago. Interventions attempted: OTC medications: Tylenol , ibuprofen. Symptoms are: gradually worsening.  Triage Disposition: See Physician Within 24 Hours  Patient/caregiver understands and will follow disposition?: Yes                 Copied from CRM [id number]. Topic: Clinical - Red Word Triage >> Jan 14, 2024  8:29 AM Kita Perish H wrote: [facility] that prompted transfer to Nurse Triage: Lower hip and leg pain and numbness on left side Reason for Disposition  Numbness in a leg or foot (i.e., loss of sensation)  Answer Assessment - Initial Assessment Questions Patient scheduled for an appointment tomorrow with PCP in office.   Pain on left hip that radiates down buttocks down leg x3 weeks If stand for a long time it is painful Trying to treat with ibuprofen and Tylenol  but not helping Long-term pain and numbness but this is worse 8-9/10 pain level with walking, when sitting a 4/10  Protocols used: Leg Pain-A-AH

## 2024-01-15 ENCOUNTER — Ambulatory Visit (INDEPENDENT_AMBULATORY_CARE_PROVIDER_SITE_OTHER): Admitting: Family Medicine

## 2024-01-15 ENCOUNTER — Encounter: Payer: Self-pay | Admitting: Family Medicine

## 2024-01-15 VITALS — BP 148/82 | HR 81 | Temp 98.1°F | Ht 72.0 in | Wt 285.0 lb

## 2024-01-15 DIAGNOSIS — M5430 Sciatica, unspecified side: Secondary | ICD-10-CM | POA: Diagnosis not present

## 2024-01-15 MED ORDER — HYDROCODONE-ACETAMINOPHEN 5-325 MG PO TABS: 1.0000 | ORAL_TABLET | Freq: Four times a day (QID) | ORAL | 0 refills | Status: DC | PRN

## 2024-01-15 MED ORDER — PREDNISONE 20 MG PO TABS: ORAL_TABLET | ORAL | 0 refills | Status: DC

## 2024-01-15 NOTE — Patient Instructions (Signed)
 Prednisone  with food.  Skip ibuprofen and aleve .  Hydrocodone  if needed- sedation caution.  Update me as needed.  Try to keep stretching.  Take care.  Glad to see you.

## 2024-01-15 NOTE — Progress Notes (Unsigned)
 Lower back pain, L sided, pain down the buttock and L leg, to the foot.  Stretching.  Taking ibuprofen, used heat.  No R sided sx.  No rash.  No trauma, no trigger.  No FCNAVD.  No change in urinary symptoms.  No saddle anesthesia but altered sensation on the L leg.  Normal R leg sensation.  No foot drop.  Pain with LLE movement.  H/o spinal stenosis and s/p laminectomy.   Meds, vitals, and allergies reviewed.   ROS: Per HPI unless specifically indicated in ROS section   Nad but uncomfortable from left leg and left lower back pain. Ncat Neck supple, no LA Rrr Ctab L SLR positive  S/S grossly wnl.   Able to bear weight. R SLR neg

## 2024-01-17 NOTE — Assessment & Plan Note (Signed)
 Discussed options.  Okay to defer imaging at this point. Prednisone  with food.  Steroid cautions discussed with patient. Skip ibuprofen and aleve  while taking prednisone . Hydrocodone  if needed- sedation caution.  Update me as needed.  Try to keep stretching.  He agrees with plan.  Okay for outpatient follow-up.

## 2024-02-19 ENCOUNTER — Other Ambulatory Visit: Payer: Self-pay | Admitting: Family Medicine

## 2024-03-05 ENCOUNTER — Ambulatory Visit: Payer: Self-pay | Admitting: *Deleted

## 2024-03-05 NOTE — Telephone Encounter (Signed)
 FYI: Patient is scheduled 03/06/24 with [place]

## 2024-03-05 NOTE — Telephone Encounter (Signed)
 Noted. Thanks.

## 2024-03-05 NOTE — Addendum Note (Signed)
 Addended by: VICCI SELLER A on: 03/05/2024 10:23 AM   Modules accepted: Orders

## 2024-03-05 NOTE — Telephone Encounter (Signed)
 FYI Only or Action Required?: FYI only for provider.  Patient was last seen in primary care on 01/15/2024 by Cleatus Arlyss RAMAN, MD.  Called Nurse Triage reporting Back Pain.  Symptoms began several days ago.  Interventions attempted: OTC medications: tylenol , ibuprofen.  Symptoms are: got better- now returned.  Triage Disposition: See Physician Within 24 Hours  Patient/caregiver understands and will follow disposition?: yes   Reason for Disposition  MODERATE pain (e.g., interferes with normal activities or awakens from sleep)  Answer Assessment - Initial Assessment Questions 1. ONSET: When did the pain begin? (e.g., minutes, hours, days)   02/14/24 2. LOCATION: Where does it hurt? (upper, mid or lower back)     RLQ-flank 3. SEVERITY: How bad is the pain?  (e.g., Scale 1-10; mild, moderate, or severe)     Cycles- 3-4/10 4. PATTERN: Is the pain constant? (e.g., yes, no; constant, intermittent)      Comes and goes 5. RADIATION: Does the pain shoot into your legs or somewhere else?     Sides to front- numbness in back flank 6. CAUSE:  What do you think is causing the back pain?      unsure 7. BACK OVERUSE:  Any recent lifting of heavy objects, strenuous work or exercise?     no 8. MEDICINES: What have you taken so far for the pain? (e.g., nothing, acetaminophen , NSAIDS)     Tylenol , ibuprofen 9. NEUROLOGIC SYMPTOMS: Do you have any weakness, numbness, or problems with bowel/bladder control?     Numbness in lower back- constant- changes in intensity 10. OTHER SYMPTOMS: Do you have any other symptoms? (e.g., fever, abdomen pain, burning with urination, blood in urine)       no  Protocols used: Back Pain-A-AH, Flank Pain-A-AH   Copied from CRM #[id number]. Topic: Clinical - Red Word Triage >> Mar 05, 2024  2:07 PM Rosina BIRCH wrote: Reason for RMF:ontzm right back pain and numbness

## 2024-03-05 NOTE — Progress Notes (Signed)
 Remote pacemaker transmission.

## 2024-03-06 ENCOUNTER — Ambulatory Visit (INDEPENDENT_AMBULATORY_CARE_PROVIDER_SITE_OTHER): Admitting: Internal Medicine

## 2024-03-06 ENCOUNTER — Encounter: Payer: Self-pay | Admitting: Internal Medicine

## 2024-03-06 VITALS — BP 138/88 | HR 90 | Temp 98.0°F | Ht 72.0 in | Wt 287.0 lb

## 2024-03-06 DIAGNOSIS — R109 Unspecified abdominal pain: Secondary | ICD-10-CM | POA: Diagnosis not present

## 2024-03-06 LAB — POC URINALSYSI DIPSTICK (AUTOMATED)
Bilirubin, UA: NEGATIVE
Glucose, UA: NEGATIVE
Ketones, UA: NEGATIVE
Leukocytes, UA: NEGATIVE
Nitrite, UA: NEGATIVE
Protein, UA: NEGATIVE
Spec Grav, UA: 1.01 (ref 1.010–1.025)
Urobilinogen, UA: 0.2 U/dL
pH, UA: 7.5 (ref 5.0–8.0)

## 2024-03-06 NOTE — Assessment & Plan Note (Signed)
 Urinalysis shows just trace blood---not consistent with kidney stone Unclear if this is radiculopathy or muscle--but not sciatica and not from spinal cord Discussed heat, continue ibuprofen/tylenol  Try lidocaine   Considering chiropractic---or can refer for PT --if ongoing Consider ortho as well

## 2024-03-06 NOTE — Progress Notes (Signed)
 Subjective:    Patient ID: Carlos Foster, male    DOB: Apr 05, 1951, 73 y.o.   MRN: [id number]  HPI Here due to right flank pain  Some chronic back pain Left sciatica in June--that went away  Now with right flank pain--towards buttocks and around front at times Started ~2 weeks ago Sharp and burning No injuries Always has back pain--but this gets worse when he stands and gets up to walk around May loosen up with moving around--but not all the time Has had to stop things--like walking in grocery store  Taking ibuprofen 600mg  and tylenol  500mg  twice a day---helps some  Pain doesn't go down his leg No leg weakness No rash  Current Outpatient Medications on File Prior to Visit  Medication Sig Dispense Refill   amLODipine  (NORVASC ) 10 MG tablet Take 1 tablet (10 mg total) by mouth daily. 90 tablet 3   aspirin EC 81 MG tablet Take 81 mg by mouth daily.     atorvastatin  (LIPITOR) 10 MG tablet Take 1 tablet (10 mg total) by mouth daily. 90 tablet 3   doxazosin  (CARDURA ) 2 MG tablet TAKE ONE TABLET BY MOUTH ONCE DAILY 90 tablet 3   doxycycline  (VIBRAMYCIN ) 100 MG capsule TAKE ONE CAPSULE (100 MG TOTAL) BY MOUTH TWO TIMES DAILY AS NEEDED FOR ROSACEA FLARE-UPS. 60 capsule 0   levothyroxine  (SYNTHROID ) 150 MCG tablet TAKE ONE TABLET BY MOUTH ONCE A DAY BEFORE BREAKFAST 90 tablet 3   lisinopril  (ZESTRIL ) 40 MG tablet Take 1 tablet (40 mg total) by mouth daily. 90 tablet 3   metroNIDAZOLE  (METROGEL ) 1 % gel Apply 1 application  topically daily as needed for rosacea. 60 g 3   potassium chloride  SA (KLOR-CON  M) 20 MEQ tablet Take 3 tablets in the morning and 2 tablets in the evening. 450 tablet 3   spironolactone  (ALDACTONE ) 25 MG tablet Take 1 tablet (25 mg total) by mouth daily. 90 tablet 3   No current facility-administered medications on file prior to visit.    Allergies  Allergen Reactions   Flexeril  [Cyclobenzaprine ]     Gumline irritation/pulse irregularity   Gabapentin Other (See  Comments)    Leg swelling   Lyrica [Pregabalin]     Leg swelling    Past Medical History:  Diagnosis Date   Arthritis    feet   GERD (gastroesophageal reflux disease)    History of hiatal hernia    Hyperlipidemia    Hypertension    Hypokalemia    Hypothyroidism    Kidney stones    Rosacea     Past Surgical History:  Procedure Laterality Date   ACHILLES TENDON SURGERY Left 06/01/2020   C2 C3 Laminectomy  07/09/2016   COLONOSCOPY     COLONOSCOPY  11/2019   ETT  04/15/03   wnl ST decreased < 1mm V6   EVACUATION OF CERVICAL HEMATOMA N/A 07/21/2016   Procedure: RE-EXPLORATION OF ANTERIOR CERVICAL WOUND AND EVACUATION OF CERVICAL HEMATOMA;  Surgeon: Arley Helling, MD;  Location: [facility];  Service: Neurosurgery;  Laterality: N/A;   HAMMER TOE SURGERY Left 01/06/2016   Procedure: LEFT SECOND HAMMER TOE CORRECTION;  Surgeon: Norleen Armor, MD;  Location: [facility];  Service: Orthopedics;  Laterality: Left;   hematoma removal     around larnyx   HEMORRHOID SURGERY  1982   HERNIA REPAIR Bilateral 2015   inguinal   LAMINECTOMY  12/02/99   C4/5, 5/6, 6/7 Fusion (Dr. Leeann)   LUMBAR LAMINECTOMY/DECOMPRESSION MICRODISCECTOMY Left 03/02/2017  Procedure: Laminectomy and Foraminotomy - L2-L3 - L3-L4 - left;  Surgeon: Onetha Kuba, MD;  Location: [facility];  Service: Neurosurgery;  Laterality: Left;   METATARSAL OSTEOTOMY Left 01/06/2016   Procedure: LEFT SECOND METATARSAL WEIL OSTEOTOMY;  Surgeon: Norleen Armor, MD;  Location: [facility];  Service: Orthopedics;  Laterality: Left;   Morton's Neuroma Repair Right 1982   foot   PACEMAKER IMPLANT N/A 07/11/2023   Procedure: PACEMAKER IMPLANT;  Surgeon: Waddell Danelle ORN, MD;  Location: [facility];  Service: Cardiovascular;  Laterality: N/A;   PROSTATE BIOPSY  12/10/06   Benign Robby)    Family History  Problem Relation Age of Onset   Kidney disease Mother        Acute kidney failure   Heart disease Mother         CAD   Hypertension Mother    Diabetes Mother        DM and diabetic retinopathy, toe amputation   Diabetes Other    Heart disease Father    AAA (abdominal aortic aneurysm) Father    Colon polyps Father    Lymphoma Maternal Uncle        X 2   Depression Neg Hx    Alcohol  abuse Neg Hx    Drug abuse Neg Hx    Stroke Neg Hx    Prostate cancer Neg Hx    Colon cancer Neg Hx    Esophageal cancer Neg Hx    Stomach cancer Neg Hx    Rectal cancer Neg Hx     Social History   Socioeconomic History   Marital status: Married    Spouse name: Not on file   Number of children: 1   Years of education: Not on file   Highest education level: Bachelor's degree (e.g., BA, AB, BS)  Occupational History   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   Laid off from work 2012   Engineer, structural for [employer]   Social Drivers of [employer]: Low Risk  (01/14/2024)   Overall Financial Resource Strain (CARDIA)    Difficulty of Paying Living Expenses: Not hard at all  Food Insecurity: No Food Insecurity (01/14/2024)   Hunger Vital Sign    Worried About Running Out of Food in the Last Year: Never true    Ran Out of Food in the Last Year: Never true  Transportation Needs: No Transportation Needs (01/14/2024)   PRAPARE - Administrator, Civil Service (Medical): No    Lack of Transportation (Non-Medical): No  Physical Activity: Insufficiently Active (01/14/2024)   Exercise Vital Sign    Days of Exercise per Week: 2 days     Minutes of Exercise per Session: 60 min  Stress: No Stress Concern Present (01/14/2024)   [employer] of Occupational Health - Occupational Stress Questionnaire    Feeling of Stress: Not at all  Social Connections: Moderately Integrated (01/14/2024)  Social Connection and Isolation Panel    Frequency of Communication with Friends and Family: More than three times a week    Frequency of Social Gatherings with Friends and Family: More than three times a week    Attends Religious Services: 1 to 4 times per year    Active Member of [employer] or Organizations: No    Attends Engineer, structural: Not on file    Marital Status: Married  Catering manager Violence: Not At Risk (11/07/2021)   Humiliation, Afraid, Rape, and Kick questionnaire    Fear of Current or Ex-Partner: No    Emotionally Abused: No    Physically Abused: No    Sexually Abused: No   Review of Systems No change in voiding No pain or blood Never had kidney stones     Objective:   Physical Exam Musculoskeletal:     Comments: No spine tenderness SLR negative Full ROM of right hip  Tenderness laterally ~L1-2 No change in sensation  Neurological:     Comments: No leg weakness Normal gait            Assessment & Plan:

## 2024-03-09 ENCOUNTER — Telehealth: Payer: Self-pay | Admitting: Family Medicine

## 2024-03-09 DIAGNOSIS — I77811 Abdominal aortic ectasia: Secondary | ICD-10-CM

## 2024-03-09 DIAGNOSIS — R9389 Abnormal findings on diagnostic imaging of other specified body structures: Secondary | ICD-10-CM

## 2024-03-09 NOTE — Telephone Encounter (Signed)
 Opened in error

## 2024-03-21 ENCOUNTER — Other Ambulatory Visit: Payer: Self-pay | Admitting: Family Medicine

## 2024-03-25 ENCOUNTER — Other Ambulatory Visit (HOSPITAL_COMMUNITY): Payer: Self-pay | Admitting: Neurosurgery

## 2024-03-25 DIAGNOSIS — M48061 Spinal stenosis, lumbar region without neurogenic claudication: Secondary | ICD-10-CM | POA: Diagnosis not present

## 2024-03-25 DIAGNOSIS — Z6837 Body mass index (BMI) 37.0-37.9, adult: Secondary | ICD-10-CM | POA: Diagnosis not present

## 2024-03-25 DIAGNOSIS — M5440 Lumbago with sciatica, unspecified side: Secondary | ICD-10-CM | POA: Diagnosis not present

## 2024-04-10 ENCOUNTER — Ambulatory Visit (INDEPENDENT_AMBULATORY_CARE_PROVIDER_SITE_OTHER): Payer: BLUE CROSS/BLUE SHIELD

## 2024-04-10 DIAGNOSIS — I495 Sick sinus syndrome: Secondary | ICD-10-CM

## 2024-04-10 LAB — CUP PACEART REMOTE DEVICE CHECK
Battery Voltage: 90
Date Time Interrogation Session: 20250911135108
Implantable Lead Connection Status: 753985
Implantable Lead Connection Status: 753985
Implantable Lead Implant Date: 20241211
Implantable Lead Implant Date: 20241211
Implantable Lead Location: 753859
Implantable Lead Location: 753860
Implantable Lead Model: 377
Implantable Lead Model: 377
Implantable Lead Serial Number: 8001707582
Implantable Lead Serial Number: 8001725483
Implantable Pulse Generator Implant Date: 20241211
Pulse Gen Serial Number: 1000336117

## 2024-04-17 NOTE — Progress Notes (Signed)
 Remote PPM Transmission

## 2024-04-18 ENCOUNTER — Ambulatory Visit: Payer: Self-pay | Admitting: Internal Medicine

## 2024-04-18 NOTE — CV Procedure (Signed)
  Device system confirmed to be MRI conditional, with implant date > 6 weeks ago, and no evidence of abandoned or epicardial leads in review of most recent CXR  Device last cleared by EP Provider: Prentice Passey 04/17/24  Clearance is good through for 1 year as long as parameters remain stable at time of check. If pt undergoes a cardiac device procedure during that time, they should be re-cleared.   Tachy-therapies to be programmed off if applicable with device back to pre-MRI settings after completion of exam.  [employer] [facility] [employer] was available remotely to assist in programming recommendations.   Rocky Catalan, RT  04/18/2024 9:42 AM

## 2024-04-23 ENCOUNTER — Ambulatory Visit (HOSPITAL_COMMUNITY)
Admission: RE | Admit: 2024-04-23 | Discharge: 2024-04-23 | Disposition: A | Discharge status: Home / Self Care | Source: Ambulatory Visit | Attending: Neurosurgery | Admitting: Neurosurgery

## 2024-04-23 DIAGNOSIS — M48061 Spinal stenosis, lumbar region without neurogenic claudication: Secondary | ICD-10-CM | POA: Insufficient documentation

## 2024-04-23 DIAGNOSIS — M48062 Spinal stenosis, lumbar region with neurogenic claudication: Secondary | ICD-10-CM | POA: Diagnosis not present

## 2024-04-23 MED ORDER — GADOBUTROL 1 MMOL/ML IV SOLN
10.0000 mL | Freq: Once | INTRAVENOUS | Status: AC | PRN
  Administered 2024-04-23: 10 mL via INTRAVENOUS

## 2024-04-23 NOTE — Progress Notes (Signed)
 Patient was monitored by this RN during MRI scan due to presence of a pacemaker. Cardiac rhythm was continuously monitored throughout the procedure. Prior to the start of the scan, the pacemaker was placed in MRI-safe mode by the MRI technician and/or pacemaker representative. Following the completion of the scan, the device was returned to its pre-MRI settings. Neurological status and orientation post-procedure were unchanged from baseline.   Pre-procedure Heart Rate (Prior to being placed in MRI safe mode): 74 Post-procedure Heart Rate (Once pacemaker is returned to baseline mode): 71

## 2024-05-09 DIAGNOSIS — Z23 Encounter for immunization: Secondary | ICD-10-CM | POA: Diagnosis not present

## 2024-05-16 ENCOUNTER — Ambulatory Visit

## 2024-05-16 VITALS — BP 142/86 | Ht 72.0 in | Wt 288.8 lb

## 2024-05-16 DIAGNOSIS — Z0000 Encounter for general adult medical examination without abnormal findings: Secondary | ICD-10-CM | POA: Diagnosis not present

## 2024-05-16 NOTE — Patient Instructions (Signed)
 [name],  Thank you for taking the time for your Medicare Wellness Visit. I appreciate your continued commitment to your health goals. Please review the care plan we discussed, and feel free to reach out if I can assist you further.  Medicare recommends these wellness visits once per year to help you and your care team stay ahead of potential health issues. These visits are designed to focus on prevention, allowing your provider to concentrate on managing your acute and chronic conditions during your regular appointments.  Please note that Annual Wellness Visits do not include a physical exam. Some assessments may be limited, especially if the visit was conducted virtually. If needed, we may recommend a separate in-person follow-up with your provider.  Ongoing Care Seeing your primary care provider every 3 to 6 months helps us  monitor your health and provide consistent, personalized care.   Referrals If a referral was made during today's visit and you haven't received any updates within two weeks, please contact the referred provider directly to check on the status.  Recommended Screenings:  Health Maintenance  Topic Date Due   Zoster (Shingles) Vaccine (1 of 2) Never done   COVID-19 Vaccine (3 - Moderna risk series) 07/31/2020   DTaP/Tdap/Td vaccine (3 - Td or Tdap) 09/11/2023   Medicare Annual Wellness Visit  11/13/2023   Pneumococcal Vaccine for age over 36 (2 of 2 - PCV) 07/17/2024*   Flu Shot  10/28/2024*   Colon Cancer Screening  02/03/2026   Hepatitis C Screening  Completed   Meningitis B Vaccine  Aged Out  *Topic was postponed. The date shown is not the original due date.       07/11/2023    6:50 AM  Advanced Directives  Does Patient Have a Medical Advance Directive? Yes  Type of Estate agent of [place];Living will   Advance Care Planning is important because it: Ensures you receive medical care that aligns with your values, goals, and  preferences. Provides guidance to your family and loved ones, reducing the emotional burden of decision-making during critical moments.  Vision: Annual vision screenings are recommended for early detection of glaucoma, cataracts, and diabetic retinopathy. These exams can also reveal signs of chronic conditions such as diabetes and high blood pressure.  Dental: Annual dental screenings help detect early signs of oral cancer, gum disease, and other conditions linked to overall health, including heart disease and diabetes.

## 2024-05-16 NOTE — Progress Notes (Signed)
 Subjective:   Carlos Foster is a 73 y.o. who presents for a Medicare Wellness preventive visit.  As a reminder, Annual Wellness Visits don't include a physical exam, and some assessments may be limited, especially if this visit is performed virtually. We may recommend an in-person follow-up visit with your provider if needed.  Visit Complete: In person  Persons Participating in Visit: Patient.  AWV Questionnaire: Yes: Patient Medicare AWV questionnaire was completed by the patient on 05/15/24; I have confirmed that all information answered by patient is correct and no changes since this date.  Cardiac Risk Factors include: advanced age (>74men, >105 women);dyslipidemia;hypertension;male gender;obesity (BMI >30kg/m2)     Objective:    Today's Vitals   05/16/24 1255 05/16/24 1315  BP: (!) 140/88 (!) 142/86  Weight: 288 lb 12.8 oz (131 kg)   Height: 6' (1.829 m)    Body mass index is 39.17 kg/m.     05/16/2024    1:06 PM 07/11/2023    6:50 AM 11/03/2020    1:15 PM 10/16/2019   12:08 PM 03/01/2017   10:49 AM 11/24/2016    3:09 PM 07/22/2016    2:00 AM  Advanced Directives  Does Patient Have a Medical Advance Directive? Yes Yes Yes Yes Yes  Yes  Yes   Type of Estate agent of [place];Living will Healthcare Power of [place];Living will Healthcare Power of [place];Living will Healthcare Power of [place];Living will Healthcare Power of [place];Living will Healthcare Power of [place];Living will Living will  Does patient want to make changes to medical advance directive?       No - Patient declined   Copy of Healthcare Power of Attorney in Chart?   No - copy requested No - copy requested No - copy requested  No - copy requested       Data saved with a previous flowsheet row definition    Current Medications (verified) Outpatient Encounter Medications as of 05/16/2024  Medication Sig   amLODipine  (NORVASC ) 10 MG tablet Take 1 tablet (10 mg total) by mouth  daily.   aspirin EC 81 MG tablet Take 81 mg by mouth daily.   atorvastatin  (LIPITOR) 10 MG tablet Take 1 tablet (10 mg total) by mouth daily.   doxazosin  (CARDURA ) 2 MG tablet TAKE ONE TABLET BY MOUTH ONCE DAILY   doxycycline  (VIBRAMYCIN ) 100 MG capsule TAKE ONE CAPSULE (100 MG TOTAL) BY MOUTH TWO TIMES DAILY AS NEEDED FOR ROSACEA FLARE-UPS.   levothyroxine  (SYNTHROID ) 150 MCG tablet TAKE ONE TABLET BY MOUTH ONCE A DAY BEFORE BREAKFAST   lisinopril  (ZESTRIL ) 40 MG tablet Take 1 tablet (40 mg total) by mouth daily.   metroNIDAZOLE  (METROGEL ) 1 % gel Apply 1 application  topically daily as needed for rosacea.   potassium chloride  SA (KLOR-CON  M) 20 MEQ tablet Take 3 tablets in the morning and 2 tablets in the evening.   spironolactone  (ALDACTONE ) 25 MG tablet Take 1 tablet (25 mg total) by mouth daily.   No facility-administered encounter medications on file as of 05/16/2024.    Allergies (verified) Flexeril  [cyclobenzaprine ], Gabapentin, and Lyrica [pregabalin]   History: Past Medical History:  Diagnosis Date   Arthritis    feet   GERD (gastroesophageal reflux disease)    History of hiatal hernia    Hyperlipidemia    Hypertension    Hypokalemia    Hypothyroidism    Kidney stones    Rosacea    Past Surgical History:  Procedure Laterality Date   ACHILLES TENDON SURGERY  Left 06/01/2020   C2 C3 Laminectomy  07/09/2016   COLONOSCOPY     COLONOSCOPY  11/2019   ETT  04/15/2003   wnl ST decreased < 1mm V6   EVACUATION OF CERVICAL HEMATOMA N/A 07/21/2016   Procedure: RE-EXPLORATION OF ANTERIOR CERVICAL WOUND AND EVACUATION OF CERVICAL HEMATOMA;  Surgeon: Arley Helling, MD;  Location: [facility] OR;  Service: Neurosurgery;  Laterality: N/A;   HAMMER TOE SURGERY Left 01/06/2016   Procedure: LEFT SECOND HAMMER TOE CORRECTION;  Surgeon: Norleen Armor, MD;  Location: [facility];  Service: Orthopedics;  Laterality: Left;   hematoma removal     around larnyx   HEMORRHOID SURGERY  1982    HERNIA REPAIR Bilateral 2015   inguinal   LAMINECTOMY  12/02/1999   C4/5, 5/6, 6/7 Fusion (Dr. Leeann)   LUMBAR LAMINECTOMY/DECOMPRESSION MICRODISCECTOMY Left 03/02/2017   Procedure: Laminectomy and Foraminotomy - L2-L3 - L3-L4 - left;  Surgeon: Helling Arley, MD;  Location: [facility] OR;  Service: Neurosurgery;  Laterality: Left;   METATARSAL OSTEOTOMY Left 01/06/2016   Procedure: LEFT SECOND METATARSAL WEIL OSTEOTOMY;  Surgeon: Norleen Armor, MD;  Location: [facility];  Service: Orthopedics;  Laterality: Left;   Morton's Neuroma Repair Right 1982   foot   PACEMAKER IMPLANT N/A 07/11/2023   Procedure: PACEMAKER IMPLANT;  Surgeon: Waddell Danelle ORN, MD;  Location: [facility];  Service: Cardiovascular;  Laterality: N/A;   PROSTATE BIOPSY  12/10/2006   Benign Robby)   SPINE SURGERY     Family History  Problem Relation Age of Onset   Kidney disease Mother        Acute kidney failure   Heart disease Mother        CAD   Hypertension Mother    Diabetes Mother        DM and diabetic retinopathy, toe amputation   Diabetes Other    Heart disease Father    AAA (abdominal aortic aneurysm) Father    Colon polyps Father    Lymphoma Maternal Uncle        X 2   Depression Neg Hx    Alcohol  abuse Neg Hx    Drug abuse Neg Hx    Stroke Neg Hx    Prostate cancer Neg Hx    Colon cancer Neg Hx    Esophageal cancer Neg Hx    Stomach cancer Neg Hx    Rectal cancer Neg Hx    Social History   Socioeconomic History   Marital status: Married    Spouse name: Not on file   Number of children: 1   Years of education: Not on file   Highest education level: Bachelor's degree (e.g., BA, AB, BS)  Occupational History   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   Laid off from work 2012   Engineer, structural for [employer]   Social Drivers of [employer]  Resource Strain: Low Risk  (05/15/2024)   Overall Financial Resource Strain (CARDIA)    Difficulty of Paying Living Expenses: Not hard at all  Food Insecurity: No Food Insecurity (05/15/2024)   Hunger Vital Sign    Worried About Running Out of Food in the Last Year: Never true    Ran Out of Food in the Last Year: Never true  Transportation Needs: No Transportation Needs (05/15/2024)   PRAPARE - Administrator, Civil Service (Medical): No    Lack of Transportation (Non-Medical): No  Physical Activity: Insufficiently Active (05/15/2024)   Exercise Vital Sign    Days of Exercise per Week: 2 days    Minutes of Exercise per Session: 60 min  Stress: No Stress Concern Present (05/15/2024)   [employer] of Occupational Health - Occupational Stress Questionnaire    Feeling of Stress: Not at all  Social Connections: Moderately Integrated (05/15/2024)   Social Connection and Isolation Panel    Frequency of Communication with Friends and Family: More than three times a week    Frequency of Social Gatherings with Friends and Family: More than three times a week    Attends Religious Services: 1 to 4 times per year    Active Member of [employer] or Organizations: No    Attends Engineer, structural: Not on file    Marital Status: Married    Tobacco Counseling Counseling given: Not Answered    Clinical Intake:  Pre-visit preparation completed: Yes  Pain : No/denies pain     BMI - recorded: 39.17 Nutritional Status: BMI > 30  Obese Nutritional Risks: None Diabetes: No  Lab Results  Component Value Date   HGBA1C  5.6 11/30/2020   HGBA1C 5.7 (A) 12/30/2018   HGBA1C 6.1 09/13/2015     How often do you need to have someone help you when you read instructions, pamphlets, or other written materials from your doctor or pharmacy?: 1 - Never  Interpreter Needed?: No  Comments: lives with wife Information entered by :: B.Ottie Neglia,LPN   Activities of Daily Living     05/15/2024    1:38 PM  In your present state of health, do you have any difficulty performing the following activities:  Hearing? 0  Vision? 0  Difficulty concentrating or making decisions? 0  Walking or climbing stairs? 0  Dressing or bathing? 0  Doing errands, shopping? 0  Preparing Food and eating ? N  Using the Toilet? N  In the past six months, have you accidently leaked urine? N  Do you have problems with loss of bowel control? N  Managing your Medications? N  Managing your Finances? N  Housekeeping or managing your Housekeeping? N    Patient Care Team: Cleatus Arlyss RAMAN, MD as PCP - General Madelyn Deanne BRAVO, OD (Optometry)  I have updated your Care Teams any recent Medical Services you may have received from other providers in the past year.     Assessment:   This is a routine wellness examination for [place].  Hearing/Vision screen Hearing Screening - Comments:: Patient denies any hearing difficulties. (Little loss not significant) Vision Screening - Comments:: Pt says their vision is good with contacts Dr  Carlis schedule   Goals Addressed             This Visit's Progress    COMPLETED: Patient Stated       10/16/2019, I will maintain and continue medications as prescribed.      COMPLETED: Patient Stated  11/03/2020, I will maintain and continue medications as prescribed.     Patient Stated       05/16/24-Maintain and continue current medication     Patient Stated       I would like to lose weight to help my back       Depression Screen     05/16/2024    1:05 PM 01/15/2024    2:30 PM  11/23/2023   11:30 AM 11/13/2022    2:02 PM 11/07/2021   11:11 AM 11/03/2020    1:22 PM 10/16/2019   12:09 PM  PHQ 2/9 Scores  PHQ - 2 Score 0 0 0 0 0 0 0  PHQ- 9 Score  0 0 0  0 0    Fall Risk     05/15/2024    1:38 PM 01/15/2024    2:30 PM 11/23/2023   11:30 AM 11/13/2022    2:02 PM 11/08/2021    2:38 PM  Fall Risk   Falls in the past year? 0 0 0 0 0  Number falls in past yr: 0 0 0 0 0  Injury with Fall? 0 0 0 0 0  Risk for fall due to :  No Fall Risks No Fall Risks No Fall Risks No Fall Risks  Follow up  Falls evaluation completed  Falls evaluation completed Falls evaluation completed      Data saved with a previous flowsheet row definition    MEDICARE RISK AT HOME:  Medicare Risk at Home Any stairs in or around the home?: (Patient-Rptd) Yes If so, are there any without handrails?: (Patient-Rptd) No Home free of loose throw rugs in walkways, pet beds, electrical cords, etc?: (Patient-Rptd) Yes Adequate lighting in your home to reduce risk of falls?: (Patient-Rptd) Yes Life alert?: (Patient-Rptd) No Use of a cane, walker or w/c?: (Patient-Rptd) No Grab bars in the bathroom?: (Patient-Rptd) No Shower chair or bench in shower?: (Patient-Rptd) No Elevated toilet seat or a handicapped toilet?: (Patient-Rptd) No  TIMED UP AND GO:  Was the test performed?  Yes  Length of time to ambulate 10 feet: 11 sec Gait steady and fast without use of assistive device  Cognitive Function: 6CIT completed    11/03/2020    1:24 PM 10/16/2019   12:10 PM  MMSE - Mini Mental State Exam  Not completed: Refused   Orientation to time  5  Orientation to Place  5  Registration  3  Attention/ Calculation  5  Recall  3  Language- repeat  1        05/16/2024    1:09 PM 11/07/2021   11:06 AM  6CIT Screen  What Year? 0 points   What month? 0 points   What time? 0 points 0 points  Count back from 20 0 points 0 points  Months in reverse 0 points 0 points  Repeat phrase 0 points 0 points  Total  Score 0 points     Immunizations Immunization History  Administered Date(s) Administered   Fluad Quad(high Dose 65+) 05/01/2019   Fluad Trivalent(High Dose 65+) 05/09/2024   HPV Bivalent 05/04/2016   Influenza Split 07/06/2011, 06/07/2012   Influenza Whole 04/30/2006, 05/30/2007, 04/23/2009, 03/31/2010   Influenza-Unspecified 05/14/2013, 03/31/2014, 04/01/2015, 05/18/2016, 05/03/2017   Moderna SARS-COV2 Booster Vaccination 07/03/2020   Moderna Sars-Covid-2 Vaccination 08/07/2019, 09/04/2019   Pneumococcal Polysaccharide-23 06/21/2010   Td 04/01/2003   Tdap 09/10/2013    Screening Tests Health Maintenance  Topic Date Due   Zoster Vaccines- Shingrix (1 of  2) Never done   COVID-19 Vaccine (3 - Moderna risk series) 07/31/2020   DTaP/Tdap/Td (3 - Td or Tdap) 09/11/2023   Medicare Annual Wellness (AWV)  11/13/2023   Pneumococcal Vaccine: 50+ Years (2 of 2 - PCV) 07/17/2024 (Originally 06/22/2011)   Colonoscopy  02/03/2026   Influenza Vaccine  Completed   Hepatitis C Screening  Completed   Meningococcal B Vaccine  Aged Out    Health Maintenance Items Addressed: None at this time needed  Additional Screening:  Vision Screening: Recommended annual ophthalmology exams for early detection of glaucoma and other disorders of the eye. Is the patient up to date with their annual eye exam?  Yes  Who is the provider or what is the name of the office in which the patient attends annual eye exams? Dr Madelyn  Dental Screening: Recommended annual dental exams for proper oral hygiene  Community Resource Referral / Chronic Care Management: CRR required this visit?  No   CCM required this visit?  Appt scheduled with PCP   Plan:    I have personally reviewed and noted the following in the patient's chart:   Medical and social history Use of alcohol , tobacco or illicit drugs  Current medications and supplements including opioid prescriptions. Patient is not currently taking opioid  prescriptions. Functional ability and status Nutritional status Physical activity Advanced directives List of other physicians Hospitalizations, surgeries, and ER visits in previous 12 months Vitals Screenings to include cognitive, depression, and falls Referrals and appointments  In addition, I have reviewed and discussed with patient certain preventive protocols, quality metrics, and best practice recommendations. A written personalized care plan for preventive services as well as general preventive health recommendations were provided to patient.   Erminio LITTIE Saris, LPN   [id number]   After Visit Summary: (In Person-Declined) Patient declined AVS at this time.  Notes: Nothing significant to report at this time. Pt BP a little elevated: relays that is where his BP runs.

## 2024-05-20 DIAGNOSIS — M5440 Lumbago with sciatica, unspecified side: Secondary | ICD-10-CM | POA: Diagnosis not present

## 2024-05-20 DIAGNOSIS — M48061 Spinal stenosis, lumbar region without neurogenic claudication: Secondary | ICD-10-CM | POA: Diagnosis not present

## 2024-05-27 DIAGNOSIS — M48062 Spinal stenosis, lumbar region with neurogenic claudication: Secondary | ICD-10-CM | POA: Diagnosis not present

## 2024-06-04 ENCOUNTER — Other Ambulatory Visit: Payer: Self-pay | Admitting: Cardiology

## 2024-06-24 DIAGNOSIS — Z6838 Body mass index (BMI) 38.0-38.9, adult: Secondary | ICD-10-CM | POA: Diagnosis not present

## 2024-06-24 DIAGNOSIS — M48061 Spinal stenosis, lumbar region without neurogenic claudication: Secondary | ICD-10-CM | POA: Diagnosis not present

## 2024-07-08 DIAGNOSIS — M48061 Spinal stenosis, lumbar region without neurogenic claudication: Secondary | ICD-10-CM | POA: Diagnosis not present

## 2024-07-10 ENCOUNTER — Ambulatory Visit: Payer: BLUE CROSS/BLUE SHIELD

## 2024-07-10 DIAGNOSIS — I495 Sick sinus syndrome: Secondary | ICD-10-CM

## 2024-07-11 LAB — CUP PACEART REMOTE DEVICE CHECK
Battery Voltage: 90
Date Time Interrogation Session: 20251211072241
Implantable Lead Connection Status: 753985
Implantable Lead Connection Status: 753985
Implantable Lead Implant Date: 20241211
Implantable Lead Implant Date: 20241211
Implantable Lead Location: 753859
Implantable Lead Location: 753860
Implantable Lead Model: 377
Implantable Lead Model: 377
Implantable Lead Serial Number: 8001707582
Implantable Lead Serial Number: 8001725483
Implantable Pulse Generator Implant Date: 20241211
Pulse Gen Serial Number: 1000336117

## 2024-07-13 ENCOUNTER — Ambulatory Visit: Payer: Self-pay | Admitting: Internal Medicine

## 2024-07-17 NOTE — Progress Notes (Signed)
 Remote PPM Transmission

## 2024-09-02 ENCOUNTER — Other Ambulatory Visit: Payer: Self-pay | Admitting: Cardiology

## 2024-09-19 ENCOUNTER — Ambulatory Visit: Admitting: Cardiology

## 2024-11-24 ENCOUNTER — Encounter: Admitting: Family Medicine

## 2025-05-20 ENCOUNTER — Ambulatory Visit
